# Patient Record
Sex: Female | Born: 1935 | Race: White | Hispanic: No | Smoking: Never smoker
Health system: Southern US, Community
[De-identification: ages and names within clinical notes are randomized; demographics above are authoritative.]

## PROBLEM LIST (undated history)

## (undated) DIAGNOSIS — Z86010 Personal history of colonic polyps: Secondary | ICD-10-CM

## (undated) DIAGNOSIS — N959 Unspecified menopausal and perimenopausal disorder: Secondary | ICD-10-CM

## (undated) DIAGNOSIS — R609 Edema, unspecified: Secondary | ICD-10-CM

## (undated) DIAGNOSIS — F341 Dysthymic disorder: Secondary | ICD-10-CM

## (undated) DIAGNOSIS — I10 Essential (primary) hypertension: Secondary | ICD-10-CM

## (undated) DIAGNOSIS — I4891 Unspecified atrial fibrillation: Principal | ICD-10-CM

## (undated) DIAGNOSIS — M1990 Unspecified osteoarthritis, unspecified site: Secondary | ICD-10-CM

## (undated) DIAGNOSIS — R0602 Shortness of breath: Secondary | ICD-10-CM

## (undated) DIAGNOSIS — I872 Venous insufficiency (chronic) (peripheral): Secondary | ICD-10-CM

## (undated) HISTORY — DX: Personal history of colonic polyps: Z86.010

## (undated) HISTORY — DX: Unspecified menopausal and perimenopausal disorder: N95.9

## (undated) HISTORY — DX: Unspecified osteoarthritis, unspecified site: M19.90

## (undated) HISTORY — PX: RECTOCELE REPAIR: SHX761

## (undated) HISTORY — DX: Venous insufficiency (chronic) (peripheral): I87.2

## (undated) HISTORY — DX: Shortness of breath: R06.02

## (undated) HISTORY — PX: CARDIAC CATHETERIZATION: SHX172

## (undated) HISTORY — DX: Essential (primary) hypertension: I10

## (undated) HISTORY — PX: HERNIA REPAIR: SHX51

## (undated) HISTORY — DX: Unspecified atrial fibrillation: I48.91

## (undated) HISTORY — DX: Dysthymic disorder: F34.1

## (undated) HISTORY — PX: CARPAL TUNNEL RELEASE: SHX101

## (undated) HISTORY — DX: Edema, unspecified: R60.9

## (undated) HISTORY — PX: BLADDER SURGERY: SHX569

---

## 1957-10-11 HISTORY — PX: ABDOMINAL HYSTERECTOMY: SHX81

## 1963-10-12 HISTORY — PX: CHOLECYSTECTOMY: SHX55

## 1991-10-12 HISTORY — PX: TOTAL KNEE ARTHROPLASTY: SHX125

## 1997-12-09 ENCOUNTER — Other Ambulatory Visit: Admission: RE | Admit: 1997-12-09 | Discharge: 1997-12-09 | Payer: Self-pay | Admitting: Obstetrics and Gynecology

## 1999-12-22 ENCOUNTER — Encounter: Admission: RE | Admit: 1999-12-22 | Discharge: 1999-12-22 | Payer: Self-pay | Admitting: Orthopedic Surgery

## 1999-12-22 ENCOUNTER — Encounter: Payer: Self-pay | Admitting: Orthopedic Surgery

## 2001-02-06 ENCOUNTER — Encounter: Payer: Self-pay | Admitting: Gastroenterology

## 2001-02-06 ENCOUNTER — Encounter: Admission: RE | Admit: 2001-02-06 | Discharge: 2001-02-06 | Payer: Self-pay | Admitting: Gastroenterology

## 2001-02-21 ENCOUNTER — Encounter (INDEPENDENT_AMBULATORY_CARE_PROVIDER_SITE_OTHER): Payer: Self-pay | Admitting: Specialist

## 2001-02-21 ENCOUNTER — Other Ambulatory Visit: Admission: RE | Admit: 2001-02-21 | Discharge: 2001-02-21 | Payer: Self-pay | Admitting: Gastroenterology

## 2001-03-28 ENCOUNTER — Encounter: Payer: Self-pay | Admitting: Gastroenterology

## 2001-03-28 ENCOUNTER — Encounter: Admission: RE | Admit: 2001-03-28 | Discharge: 2001-03-28 | Payer: Self-pay | Admitting: Gastroenterology

## 2001-10-20 ENCOUNTER — Encounter: Payer: Self-pay | Admitting: Ophthalmology

## 2001-10-24 ENCOUNTER — Ambulatory Visit (HOSPITAL_COMMUNITY): Admission: RE | Admit: 2001-10-24 | Discharge: 2001-10-24 | Payer: Self-pay | Admitting: Ophthalmology

## 2002-02-12 ENCOUNTER — Inpatient Hospital Stay (HOSPITAL_COMMUNITY): Admission: RE | Admit: 2002-02-12 | Discharge: 2002-02-15 | Payer: Self-pay | Admitting: Orthopedic Surgery

## 2002-02-15 ENCOUNTER — Inpatient Hospital Stay (HOSPITAL_COMMUNITY)
Admission: AD | Admit: 2002-02-15 | Discharge: 2002-02-22 | Payer: Self-pay | Admitting: Physical Medicine & Rehabilitation

## 2002-03-02 ENCOUNTER — Ambulatory Visit (HOSPITAL_COMMUNITY): Admission: RE | Admit: 2002-03-02 | Discharge: 2002-03-02 | Payer: Self-pay | Admitting: Orthopedic Surgery

## 2002-03-27 ENCOUNTER — Encounter: Admission: RE | Admit: 2002-03-27 | Discharge: 2002-04-20 | Payer: Self-pay | Admitting: Orthopedic Surgery

## 2004-06-04 ENCOUNTER — Ambulatory Visit (HOSPITAL_COMMUNITY): Admission: RE | Admit: 2004-06-04 | Discharge: 2004-06-04 | Payer: Self-pay | Admitting: Gastroenterology

## 2004-06-14 ENCOUNTER — Emergency Department (HOSPITAL_COMMUNITY): Admission: EM | Admit: 2004-06-14 | Discharge: 2004-06-14 | Payer: Self-pay | Admitting: Emergency Medicine

## 2004-08-21 ENCOUNTER — Ambulatory Visit: Payer: Self-pay | Admitting: Internal Medicine

## 2004-09-17 ENCOUNTER — Ambulatory Visit: Payer: Self-pay | Admitting: Internal Medicine

## 2004-10-15 ENCOUNTER — Ambulatory Visit: Payer: Self-pay | Admitting: Internal Medicine

## 2004-11-05 ENCOUNTER — Ambulatory Visit: Payer: Self-pay | Admitting: Internal Medicine

## 2004-11-12 ENCOUNTER — Ambulatory Visit: Payer: Self-pay | Admitting: Internal Medicine

## 2004-12-10 ENCOUNTER — Ambulatory Visit: Payer: Self-pay | Admitting: Internal Medicine

## 2005-01-07 ENCOUNTER — Ambulatory Visit: Payer: Self-pay | Admitting: Internal Medicine

## 2005-02-03 ENCOUNTER — Ambulatory Visit: Payer: Self-pay | Admitting: Internal Medicine

## 2005-02-10 ENCOUNTER — Ambulatory Visit: Payer: Self-pay | Admitting: *Deleted

## 2005-02-18 ENCOUNTER — Ambulatory Visit: Payer: Self-pay

## 2005-02-19 ENCOUNTER — Encounter: Payer: Self-pay | Admitting: Cardiology

## 2005-02-22 ENCOUNTER — Ambulatory Visit: Payer: Self-pay

## 2005-02-26 ENCOUNTER — Ambulatory Visit: Payer: Self-pay | Admitting: Cardiology

## 2005-02-26 ENCOUNTER — Ambulatory Visit (HOSPITAL_COMMUNITY): Admission: RE | Admit: 2005-02-26 | Discharge: 2005-02-26 | Payer: Self-pay | Admitting: Cardiology

## 2005-03-02 ENCOUNTER — Ambulatory Visit: Payer: Self-pay | Admitting: Internal Medicine

## 2005-03-04 ENCOUNTER — Ambulatory Visit: Payer: Self-pay | Admitting: Cardiology

## 2005-03-15 ENCOUNTER — Ambulatory Visit: Payer: Self-pay | Admitting: Cardiology

## 2005-03-29 ENCOUNTER — Ambulatory Visit: Payer: Self-pay | Admitting: Cardiology

## 2005-04-26 ENCOUNTER — Ambulatory Visit: Payer: Self-pay | Admitting: Internal Medicine

## 2005-05-17 ENCOUNTER — Ambulatory Visit: Payer: Self-pay | Admitting: Cardiology

## 2005-05-24 ENCOUNTER — Ambulatory Visit: Payer: Self-pay | Admitting: Internal Medicine

## 2005-06-15 ENCOUNTER — Ambulatory Visit: Payer: Self-pay | Admitting: Cardiology

## 2005-07-12 ENCOUNTER — Ambulatory Visit: Payer: Self-pay | Admitting: Cardiology

## 2005-08-09 ENCOUNTER — Ambulatory Visit: Payer: Self-pay | Admitting: Internal Medicine

## 2005-08-24 ENCOUNTER — Ambulatory Visit: Payer: Self-pay | Admitting: Internal Medicine

## 2005-09-06 ENCOUNTER — Ambulatory Visit: Payer: Self-pay | Admitting: Cardiology

## 2005-10-06 ENCOUNTER — Ambulatory Visit: Payer: Self-pay | Admitting: *Deleted

## 2005-11-01 ENCOUNTER — Ambulatory Visit: Payer: Self-pay | Admitting: Internal Medicine

## 2005-11-18 ENCOUNTER — Ambulatory Visit: Payer: Self-pay | Admitting: Internal Medicine

## 2005-11-29 ENCOUNTER — Ambulatory Visit: Payer: Self-pay | Admitting: Cardiology

## 2005-12-27 ENCOUNTER — Ambulatory Visit: Payer: Self-pay | Admitting: Internal Medicine

## 2006-01-24 ENCOUNTER — Ambulatory Visit: Payer: Self-pay | Admitting: Internal Medicine

## 2006-01-31 ENCOUNTER — Ambulatory Visit: Payer: Self-pay | Admitting: Internal Medicine

## 2006-02-21 ENCOUNTER — Ambulatory Visit: Payer: Self-pay | Admitting: Cardiology

## 2006-03-21 ENCOUNTER — Ambulatory Visit: Payer: Self-pay | Admitting: Cardiology

## 2006-04-04 ENCOUNTER — Ambulatory Visit: Payer: Self-pay | Admitting: Internal Medicine

## 2006-05-02 ENCOUNTER — Ambulatory Visit: Payer: Self-pay | Admitting: Cardiology

## 2006-05-06 ENCOUNTER — Ambulatory Visit: Payer: Self-pay | Admitting: Internal Medicine

## 2006-05-16 ENCOUNTER — Ambulatory Visit: Payer: Self-pay | Admitting: Internal Medicine

## 2006-05-30 ENCOUNTER — Ambulatory Visit: Payer: Self-pay | Admitting: Internal Medicine

## 2006-06-14 ENCOUNTER — Ambulatory Visit: Payer: Self-pay | Admitting: Cardiovascular Disease

## 2006-07-04 ENCOUNTER — Ambulatory Visit: Payer: Self-pay | Admitting: Cardiology

## 2006-08-02 ENCOUNTER — Ambulatory Visit: Payer: Self-pay | Admitting: Internal Medicine

## 2006-08-24 ENCOUNTER — Ambulatory Visit: Payer: Self-pay | Admitting: Internal Medicine

## 2006-08-30 ENCOUNTER — Ambulatory Visit: Payer: Self-pay | Admitting: Cardiovascular Disease

## 2006-09-27 ENCOUNTER — Ambulatory Visit: Payer: Self-pay | Admitting: Cardiology

## 2006-10-25 ENCOUNTER — Ambulatory Visit: Payer: Self-pay | Admitting: Cardiology

## 2006-11-01 ENCOUNTER — Ambulatory Visit: Payer: Self-pay | Admitting: Internal Medicine

## 2006-11-22 ENCOUNTER — Ambulatory Visit: Payer: Self-pay | Admitting: Cardiology

## 2006-12-01 ENCOUNTER — Ambulatory Visit: Payer: Self-pay | Admitting: Internal Medicine

## 2006-12-29 ENCOUNTER — Ambulatory Visit: Payer: Self-pay | Admitting: Cardiology

## 2007-01-26 ENCOUNTER — Ambulatory Visit: Payer: Self-pay | Admitting: Internal Medicine

## 2007-01-31 ENCOUNTER — Ambulatory Visit: Payer: Self-pay | Admitting: Internal Medicine

## 2007-02-07 ENCOUNTER — Encounter: Payer: Self-pay | Admitting: Internal Medicine

## 2007-02-16 ENCOUNTER — Ambulatory Visit: Payer: Self-pay | Admitting: Internal Medicine

## 2007-02-23 ENCOUNTER — Ambulatory Visit: Payer: Self-pay | Admitting: Internal Medicine

## 2007-03-16 ENCOUNTER — Ambulatory Visit: Payer: Self-pay | Admitting: Cardiovascular Disease

## 2007-03-31 ENCOUNTER — Ambulatory Visit: Payer: Self-pay | Admitting: Cardiovascular Disease

## 2007-04-10 DIAGNOSIS — M1990 Unspecified osteoarthritis, unspecified site: Secondary | ICD-10-CM

## 2007-04-10 DIAGNOSIS — Z860100 Personal history of colon polyps, unspecified: Secondary | ICD-10-CM | POA: Insufficient documentation

## 2007-04-10 DIAGNOSIS — I4891 Unspecified atrial fibrillation: Secondary | ICD-10-CM

## 2007-04-10 DIAGNOSIS — I10 Essential (primary) hypertension: Secondary | ICD-10-CM

## 2007-04-10 HISTORY — DX: Personal history of colon polyps, unspecified: Z86.0100

## 2007-04-10 HISTORY — DX: Unspecified atrial fibrillation: I48.91

## 2007-04-10 HISTORY — DX: Personal history of colonic polyps: Z86.010

## 2007-04-10 HISTORY — DX: Essential (primary) hypertension: I10

## 2007-04-10 HISTORY — DX: Unspecified osteoarthritis, unspecified site: M19.90

## 2007-04-25 ENCOUNTER — Ambulatory Visit: Payer: Self-pay | Admitting: Internal Medicine

## 2007-04-25 ENCOUNTER — Encounter: Payer: Self-pay | Admitting: Internal Medicine

## 2007-04-28 ENCOUNTER — Ambulatory Visit: Payer: Self-pay | Admitting: Internal Medicine

## 2007-05-15 ENCOUNTER — Ambulatory Visit: Payer: Self-pay | Admitting: Internal Medicine

## 2007-05-15 DIAGNOSIS — N959 Unspecified menopausal and perimenopausal disorder: Secondary | ICD-10-CM | POA: Insufficient documentation

## 2007-05-15 HISTORY — DX: Unspecified menopausal and perimenopausal disorder: N95.9

## 2007-05-26 ENCOUNTER — Ambulatory Visit: Payer: Self-pay | Admitting: Cardiology

## 2007-06-07 ENCOUNTER — Ambulatory Visit: Payer: Self-pay | Admitting: Cardiology

## 2007-06-28 ENCOUNTER — Ambulatory Visit: Payer: Self-pay | Admitting: Internal Medicine

## 2007-07-26 ENCOUNTER — Ambulatory Visit: Payer: Self-pay | Admitting: Cardiovascular Disease

## 2007-08-11 ENCOUNTER — Encounter: Payer: Self-pay | Admitting: Internal Medicine

## 2007-08-16 ENCOUNTER — Ambulatory Visit: Payer: Self-pay | Admitting: Internal Medicine

## 2007-08-23 ENCOUNTER — Ambulatory Visit: Payer: Self-pay | Admitting: Cardiology

## 2007-09-18 ENCOUNTER — Ambulatory Visit: Payer: Self-pay | Admitting: Cardiovascular Disease

## 2007-09-18 ENCOUNTER — Ambulatory Visit: Payer: Self-pay | Admitting: Internal Medicine

## 2007-10-16 ENCOUNTER — Ambulatory Visit: Payer: Self-pay | Admitting: Cardiology

## 2007-11-16 ENCOUNTER — Ambulatory Visit: Payer: Self-pay | Admitting: Cardiology

## 2007-12-14 ENCOUNTER — Ambulatory Visit: Payer: Self-pay | Admitting: Cardiovascular Disease

## 2007-12-18 ENCOUNTER — Ambulatory Visit: Payer: Self-pay | Admitting: Internal Medicine

## 2007-12-29 ENCOUNTER — Ambulatory Visit: Payer: Self-pay | Admitting: Cardiovascular Disease

## 2008-01-26 ENCOUNTER — Ambulatory Visit: Payer: Self-pay | Admitting: Internal Medicine

## 2008-02-16 ENCOUNTER — Ambulatory Visit: Payer: Self-pay | Admitting: Cardiology

## 2008-03-08 ENCOUNTER — Ambulatory Visit: Payer: Self-pay | Admitting: Cardiology

## 2008-03-18 ENCOUNTER — Ambulatory Visit: Payer: Self-pay | Admitting: Cardiovascular Disease

## 2008-03-20 ENCOUNTER — Ambulatory Visit: Payer: Self-pay | Admitting: Internal Medicine

## 2008-04-05 ENCOUNTER — Ambulatory Visit: Payer: Self-pay | Admitting: Cardiology

## 2008-04-16 ENCOUNTER — Ambulatory Visit: Payer: Self-pay | Admitting: Cardiovascular Disease

## 2008-05-07 ENCOUNTER — Ambulatory Visit: Payer: Self-pay | Admitting: Internal Medicine

## 2008-06-04 ENCOUNTER — Ambulatory Visit: Payer: Self-pay | Admitting: Cardiology

## 2008-07-02 ENCOUNTER — Ambulatory Visit: Payer: Self-pay | Admitting: Cardiology

## 2008-07-08 ENCOUNTER — Ambulatory Visit: Payer: Self-pay | Admitting: Internal Medicine

## 2008-07-08 LAB — CONVERTED CEMR LAB
AST: 31 units/L (ref 0–37)
Alkaline Phosphatase: 84 units/L (ref 39–117)
Bilirubin, Direct: 0.1 mg/dL (ref 0.0–0.3)
Chloride: 108 meq/L (ref 96–112)
Eosinophils Relative: 3.8 % (ref 0.0–5.0)
GFR calc Af Amer: 68 mL/min
Glucose, Bld: 98 mg/dL (ref 70–99)
HCT: 42.4 % (ref 36.0–46.0)
Lymphocytes Relative: 32.4 % (ref 12.0–46.0)
Monocytes Absolute: 0.7 10*3/uL (ref 0.1–1.0)
Monocytes Relative: 9.6 % (ref 3.0–12.0)
Neutrophils Relative %: 53.4 % (ref 43.0–77.0)
Platelets: 253 10*3/uL (ref 150–400)
Potassium: 4.9 meq/L (ref 3.5–5.1)
RDW: 18.2 % — ABNORMAL HIGH (ref 11.5–14.6)
Sodium: 146 meq/L — ABNORMAL HIGH (ref 135–145)
Total Protein: 7.5 g/dL (ref 6.0–8.3)
WBC: 7.3 10*3/uL (ref 4.5–10.5)

## 2008-07-30 ENCOUNTER — Ambulatory Visit: Payer: Self-pay | Admitting: Cardiology

## 2008-08-27 ENCOUNTER — Ambulatory Visit: Payer: Self-pay | Admitting: Cardiovascular Disease

## 2008-09-16 ENCOUNTER — Ambulatory Visit: Payer: Self-pay | Admitting: Cardiovascular Disease

## 2008-09-24 ENCOUNTER — Ambulatory Visit: Payer: Self-pay | Admitting: Internal Medicine

## 2008-10-15 ENCOUNTER — Ambulatory Visit: Payer: Self-pay | Admitting: Internal Medicine

## 2008-10-22 ENCOUNTER — Ambulatory Visit: Payer: Self-pay | Admitting: Cardiovascular Disease

## 2008-11-18 ENCOUNTER — Ambulatory Visit: Payer: Self-pay | Admitting: Cardiology

## 2008-11-29 ENCOUNTER — Ambulatory Visit: Payer: Self-pay | Admitting: Internal Medicine

## 2008-11-29 DIAGNOSIS — K645 Perianal venous thrombosis: Secondary | ICD-10-CM

## 2008-12-05 ENCOUNTER — Encounter: Payer: Self-pay | Admitting: Internal Medicine

## 2008-12-17 ENCOUNTER — Ambulatory Visit: Payer: Self-pay | Admitting: Cardiology

## 2008-12-19 ENCOUNTER — Encounter: Payer: Self-pay | Admitting: Internal Medicine

## 2009-01-14 ENCOUNTER — Ambulatory Visit: Payer: Self-pay | Admitting: Cardiovascular Disease

## 2009-02-11 ENCOUNTER — Ambulatory Visit: Payer: Self-pay | Admitting: Internal Medicine

## 2009-02-11 DIAGNOSIS — R0602 Shortness of breath: Secondary | ICD-10-CM

## 2009-02-11 HISTORY — DX: Shortness of breath: R06.02

## 2009-02-12 ENCOUNTER — Ambulatory Visit: Payer: Self-pay | Admitting: Cardiovascular Disease

## 2009-03-11 ENCOUNTER — Encounter: Payer: Self-pay | Admitting: *Deleted

## 2009-03-12 ENCOUNTER — Ambulatory Visit: Payer: Self-pay | Admitting: Cardiology

## 2009-03-13 DIAGNOSIS — R609 Edema, unspecified: Secondary | ICD-10-CM

## 2009-03-13 HISTORY — DX: Edema, unspecified: R60.9

## 2009-03-14 ENCOUNTER — Ambulatory Visit: Payer: Self-pay | Admitting: Cardiovascular Disease

## 2009-04-04 ENCOUNTER — Ambulatory Visit: Payer: Self-pay | Admitting: Cardiology

## 2009-04-04 LAB — CONVERTED CEMR LAB
POC INR: 1.9
Prothrombin Time: 17 s

## 2009-04-16 ENCOUNTER — Encounter: Payer: Self-pay | Admitting: *Deleted

## 2009-05-02 ENCOUNTER — Ambulatory Visit: Payer: Self-pay | Admitting: Internal Medicine

## 2009-05-02 LAB — CONVERTED CEMR LAB: Prothrombin Time: 15.8 s

## 2009-05-14 ENCOUNTER — Ambulatory Visit: Payer: Self-pay | Admitting: Internal Medicine

## 2009-05-16 ENCOUNTER — Ambulatory Visit: Payer: Self-pay | Admitting: Cardiology

## 2009-05-16 LAB — CONVERTED CEMR LAB
POC INR: 2.3
Prothrombin Time: 18.7 s

## 2009-06-06 ENCOUNTER — Ambulatory Visit: Payer: Self-pay | Admitting: Cardiovascular Disease

## 2009-07-04 ENCOUNTER — Ambulatory Visit: Payer: Self-pay | Admitting: Internal Medicine

## 2009-07-04 LAB — CONVERTED CEMR LAB: POC INR: 2.8

## 2009-08-01 ENCOUNTER — Ambulatory Visit: Payer: Self-pay | Admitting: Cardiology

## 2009-08-13 ENCOUNTER — Ambulatory Visit: Payer: Self-pay | Admitting: Internal Medicine

## 2009-08-29 ENCOUNTER — Ambulatory Visit: Payer: Self-pay | Admitting: Cardiology

## 2009-09-08 ENCOUNTER — Encounter (INDEPENDENT_AMBULATORY_CARE_PROVIDER_SITE_OTHER): Payer: Self-pay | Admitting: *Deleted

## 2009-09-10 ENCOUNTER — Ambulatory Visit: Payer: Self-pay | Admitting: Cardiovascular Disease

## 2009-09-24 ENCOUNTER — Ambulatory Visit: Payer: Self-pay | Admitting: Cardiology

## 2009-10-23 ENCOUNTER — Ambulatory Visit: Payer: Self-pay | Admitting: Internal Medicine

## 2009-11-06 ENCOUNTER — Telehealth: Payer: Self-pay | Admitting: Internal Medicine

## 2009-11-24 ENCOUNTER — Ambulatory Visit: Payer: Self-pay | Admitting: Cardiology

## 2009-11-24 ENCOUNTER — Encounter (INDEPENDENT_AMBULATORY_CARE_PROVIDER_SITE_OTHER): Payer: Self-pay | Admitting: Cardiology

## 2009-11-24 LAB — CONVERTED CEMR LAB: POC INR: 3.3

## 2009-12-11 ENCOUNTER — Ambulatory Visit: Payer: Self-pay | Admitting: Internal Medicine

## 2009-12-11 DIAGNOSIS — F341 Dysthymic disorder: Secondary | ICD-10-CM

## 2009-12-11 HISTORY — DX: Dysthymic disorder: F34.1

## 2009-12-15 ENCOUNTER — Ambulatory Visit: Payer: Self-pay | Admitting: Internal Medicine

## 2009-12-15 LAB — CONVERTED CEMR LAB: POC INR: 2.2

## 2010-01-05 ENCOUNTER — Telehealth: Payer: Self-pay | Admitting: Internal Medicine

## 2010-01-13 ENCOUNTER — Ambulatory Visit: Payer: Self-pay | Admitting: Internal Medicine

## 2010-01-13 LAB — CONVERTED CEMR LAB: POC INR: 2.4

## 2010-01-22 ENCOUNTER — Ambulatory Visit: Payer: Self-pay | Admitting: Internal Medicine

## 2010-01-22 DIAGNOSIS — I872 Venous insufficiency (chronic) (peripheral): Secondary | ICD-10-CM

## 2010-01-22 HISTORY — DX: Venous insufficiency (chronic) (peripheral): I87.2

## 2010-02-10 ENCOUNTER — Ambulatory Visit: Payer: Self-pay | Admitting: Cardiology

## 2010-03-11 ENCOUNTER — Ambulatory Visit: Payer: Self-pay | Admitting: Cardiology

## 2010-03-11 ENCOUNTER — Ambulatory Visit: Payer: Self-pay | Admitting: Cardiovascular Disease

## 2010-04-08 ENCOUNTER — Ambulatory Visit: Payer: Self-pay | Admitting: Cardiology

## 2010-04-08 LAB — CONVERTED CEMR LAB: POC INR: 2.6

## 2010-05-06 ENCOUNTER — Ambulatory Visit: Payer: Self-pay | Admitting: Cardiology

## 2010-05-27 ENCOUNTER — Ambulatory Visit: Payer: Self-pay | Admitting: Internal Medicine

## 2010-06-03 ENCOUNTER — Ambulatory Visit: Payer: Self-pay | Admitting: Cardiology

## 2010-07-01 ENCOUNTER — Ambulatory Visit: Payer: Self-pay | Admitting: Cardiology

## 2010-07-01 LAB — CONVERTED CEMR LAB: POC INR: 3.3

## 2010-07-13 ENCOUNTER — Encounter: Payer: Self-pay | Admitting: Cardiology

## 2010-07-13 ENCOUNTER — Ambulatory Visit: Payer: Self-pay | Admitting: Cardiology

## 2010-07-13 ENCOUNTER — Observation Stay (HOSPITAL_COMMUNITY)
Admission: EM | Admit: 2010-07-13 | Discharge: 2010-07-14 | Payer: Self-pay | Source: Home / Self Care | Admitting: Emergency Medicine

## 2010-07-13 ENCOUNTER — Telehealth: Payer: Self-pay | Admitting: Cardiovascular Disease

## 2010-07-13 IMAGING — CR DG CHEST 2V
2 series · 2 of 2 positions shown · non-contrast
Comparison: [DATE]

CLINICAL DATA: Chest pain

CHEST - 2 VIEW

[w chest pa]
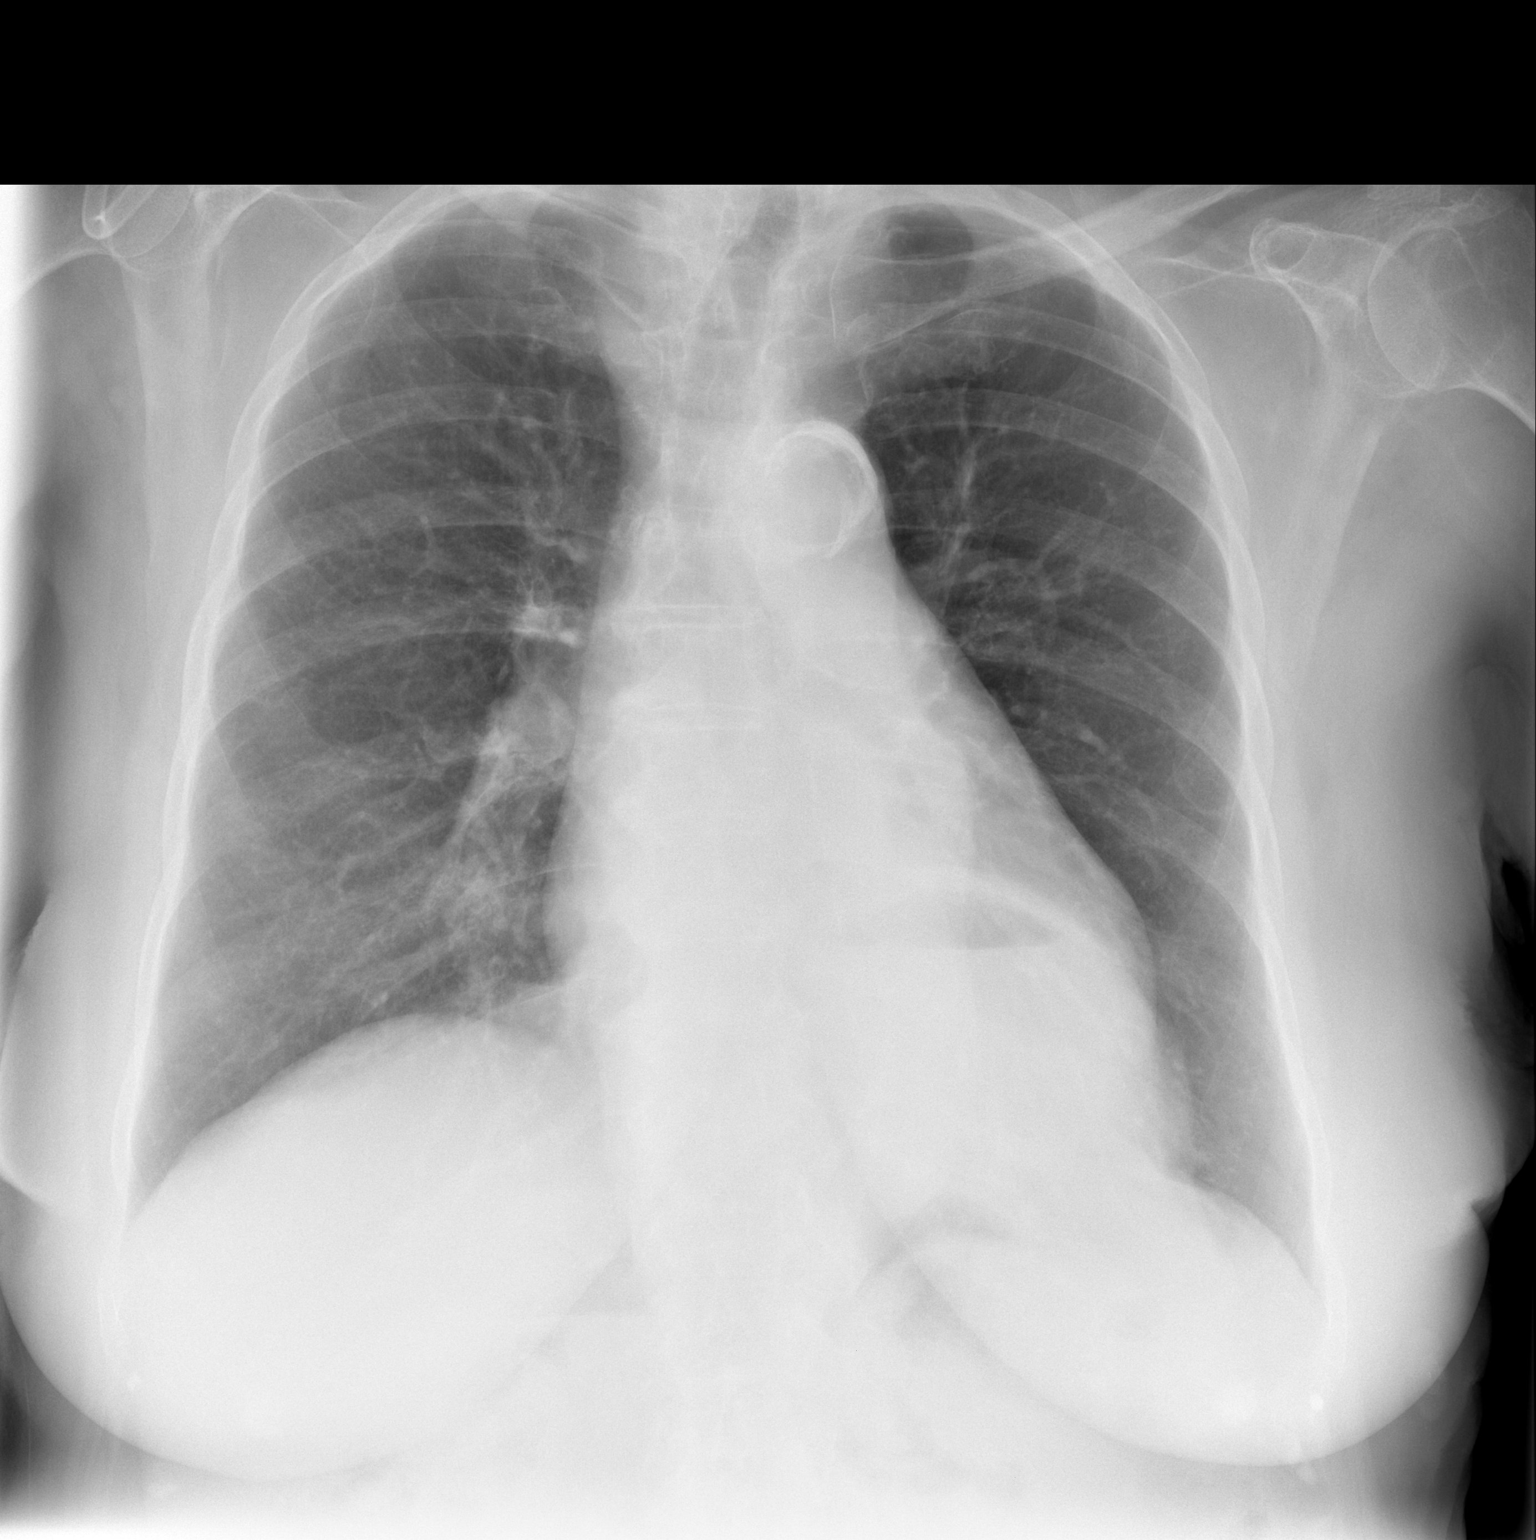

[w chest lat]
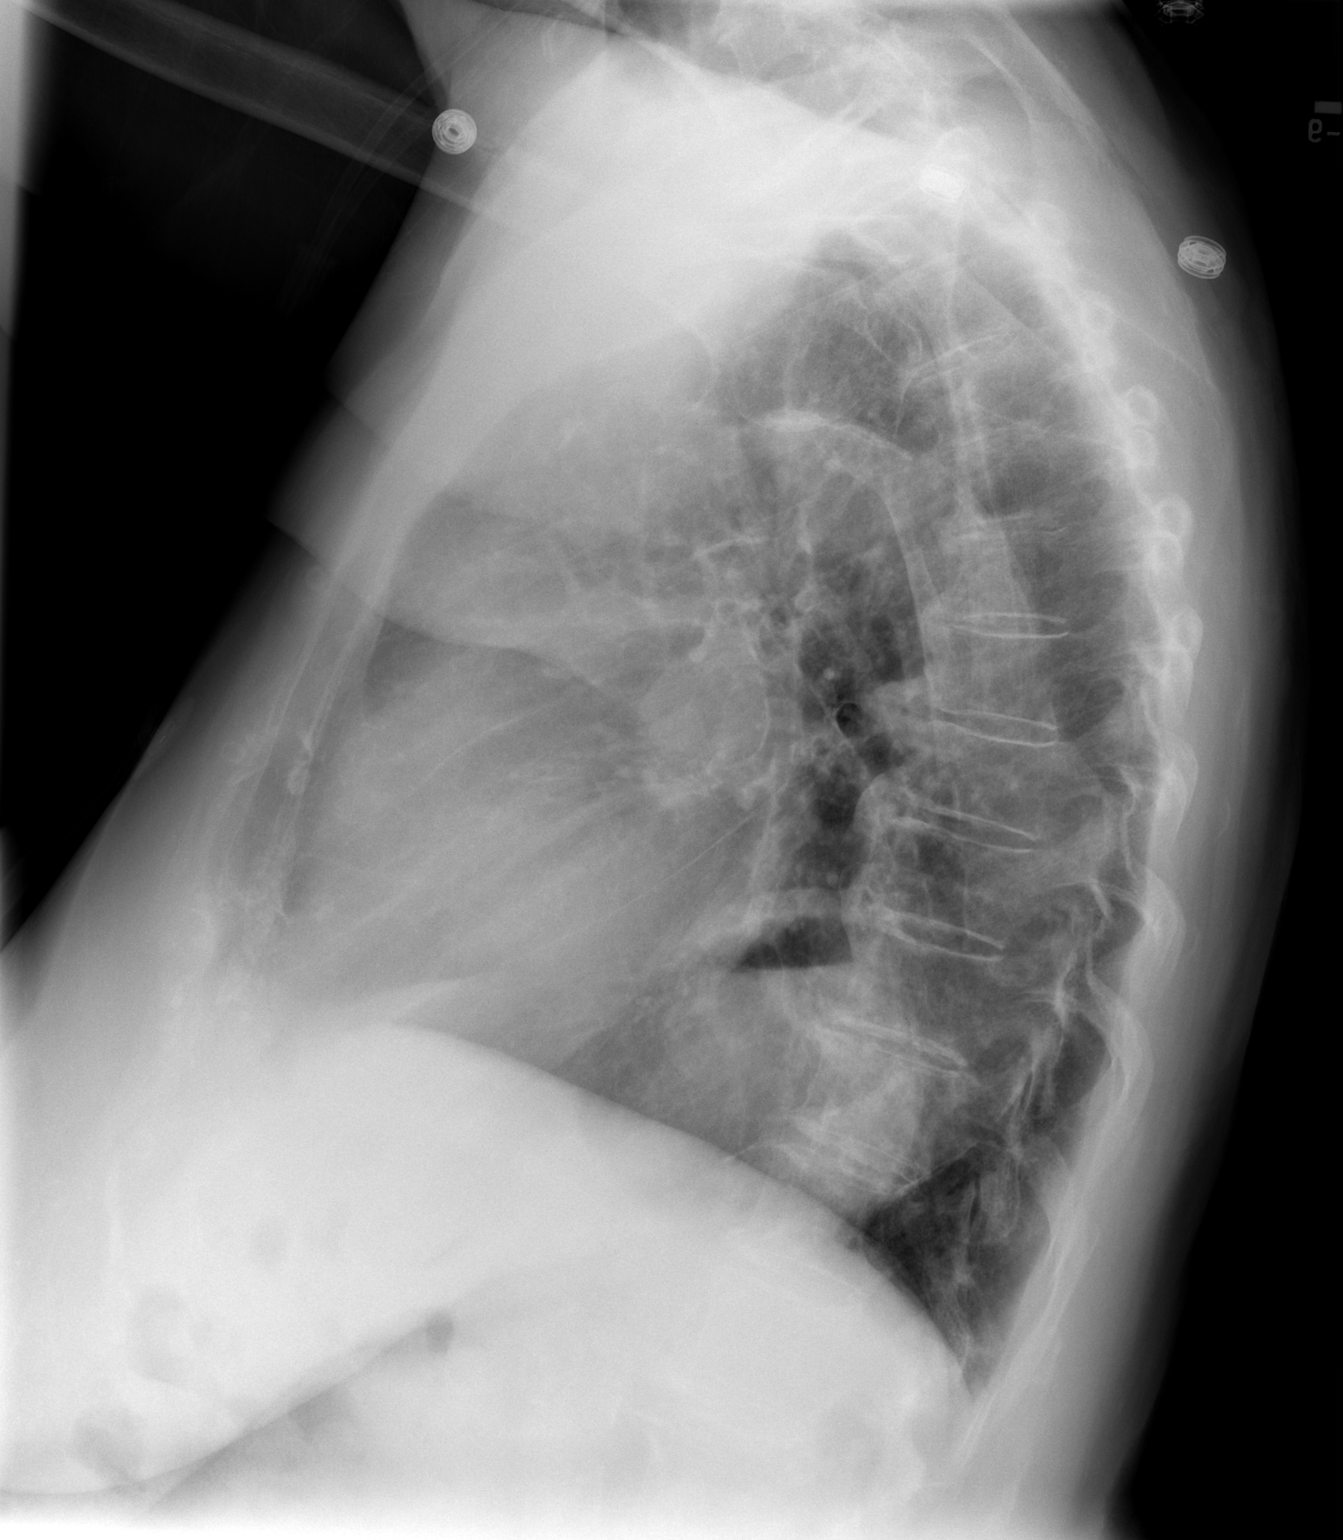

[2 of 2 positions shown; findings below may reference images not displayed]

FINDINGS: Heart size upper normal.  Aorta tortuous and heavily
calcified.  No congestive heart failure or active disease.
Moderate-to-large hiatal hernia noted.  Osseous structures intact.
IMPRESSION: 1.  Tortuous and heavily calcified aorta.
2.  Hiatal hernia.
3.  No active disease.

## 2010-07-22 ENCOUNTER — Ambulatory Visit: Payer: Self-pay | Admitting: Cardiology

## 2010-07-22 LAB — CONVERTED CEMR LAB: POC INR: 2.6

## 2010-08-10 ENCOUNTER — Telehealth: Payer: Self-pay | Admitting: Internal Medicine

## 2010-08-13 ENCOUNTER — Ambulatory Visit: Payer: Self-pay | Admitting: Cardiovascular Disease

## 2010-08-13 ENCOUNTER — Encounter: Payer: Self-pay | Admitting: Cardiovascular Disease

## 2010-08-13 ENCOUNTER — Ambulatory Visit (HOSPITAL_COMMUNITY): Admission: RE | Admit: 2010-08-13 | Discharge: 2010-08-13 | Payer: Self-pay | Admitting: Cardiovascular Disease

## 2010-08-13 ENCOUNTER — Ambulatory Visit: Payer: Self-pay

## 2010-08-19 ENCOUNTER — Encounter: Payer: Self-pay | Admitting: Cardiology

## 2010-08-19 ENCOUNTER — Encounter: Payer: Self-pay | Admitting: Cardiovascular Disease

## 2010-08-19 ENCOUNTER — Ambulatory Visit: Payer: Self-pay

## 2010-08-19 ENCOUNTER — Ambulatory Visit: Payer: Self-pay | Admitting: Cardiovascular Disease

## 2010-08-19 LAB — CONVERTED CEMR LAB: POC INR: 2.3

## 2010-09-09 ENCOUNTER — Encounter: Payer: Self-pay | Admitting: Internal Medicine

## 2010-09-16 ENCOUNTER — Ambulatory Visit: Payer: Self-pay | Admitting: Internal Medicine

## 2010-09-16 LAB — CONVERTED CEMR LAB: POC INR: 2

## 2010-09-23 ENCOUNTER — Ambulatory Visit: Payer: Self-pay | Admitting: Internal Medicine

## 2010-10-14 ENCOUNTER — Ambulatory Visit: Admission: RE | Admit: 2010-10-14 | Discharge: 2010-10-14 | Payer: Self-pay | Source: Home / Self Care

## 2010-10-14 LAB — CONVERTED CEMR LAB: POC INR: 2.3

## 2010-11-08 LAB — CONVERTED CEMR LAB
AST: 24 units/L (ref 0–37)
Basophils Absolute: 0 10*3/uL (ref 0.0–0.1)
Bilirubin, Direct: 0.2 mg/dL (ref 0.0–0.3)
CO2: 30 meq/L (ref 19–32)
Chloride: 107 meq/L (ref 96–112)
Creatinine, Ser: 0.9 mg/dL (ref 0.4–1.2)
Eosinophils Relative: 2.4 % (ref 0.0–5.0)
Glucose, Bld: 94 mg/dL (ref 70–99)
HCT: 38.2 % (ref 36.0–46.0)
Hemoglobin: 13 g/dL (ref 12.0–15.0)
MCHC: 34.1 g/dL (ref 30.0–36.0)
MCV: 89 fL (ref 78.0–100.0)
Monocytes Absolute: 0.6 10*3/uL (ref 0.2–0.7)
Neutrophils Relative %: 52.7 % (ref 43.0–77.0)
Potassium: 4 meq/L (ref 3.5–5.1)
RBC: 4.29 M/uL (ref 3.87–5.11)
RDW: 16.8 % — ABNORMAL HIGH (ref 11.5–14.6)
Sodium: 145 meq/L (ref 135–145)
TSH: 1.49 microintl units/mL (ref 0.35–5.50)
Total Bilirubin: 1.4 mg/dL — ABNORMAL HIGH (ref 0.3–1.2)
Total Protein: 6.6 g/dL (ref 6.0–8.3)
WBC: 5.7 10*3/uL (ref 4.5–10.5)

## 2010-11-11 ENCOUNTER — Encounter: Payer: Self-pay | Admitting: Internal Medicine

## 2010-11-11 ENCOUNTER — Ambulatory Visit: Admit: 2010-11-11 | Payer: Self-pay

## 2010-11-11 ENCOUNTER — Encounter (INDEPENDENT_AMBULATORY_CARE_PROVIDER_SITE_OTHER): Payer: MEDICARE

## 2010-11-11 DIAGNOSIS — I4891 Unspecified atrial fibrillation: Secondary | ICD-10-CM

## 2010-11-11 DIAGNOSIS — Z7901 Long term (current) use of anticoagulants: Secondary | ICD-10-CM

## 2010-11-11 LAB — CONVERTED CEMR LAB: POC INR: 2

## 2010-11-12 NOTE — Assessment & Plan Note (Signed)
Summary: 4 MONTH ROV/NJR   Vital Signs:  Patient profile:   77 year old female Weight:      173 pounds Temp:     97.4 degrees F oral BP sitting:   140 / 70  (left arm) Cuff size:   regular  Vitals Entered By: Duard Brady LPN (September 23, 2010 10:32 AM) CC: 4 month rov---doing ok  Is Patient Diabetic? No   Primary Care Provider:  Gordy Savers  MD  CC:  4 month rov---doing ok .  History of Present Illness: 77 year old patient who is seen today for follow-up.  She has a history of chronic atrial fibrillation on rate control and anticoagulation.  Coumadin therapy.  She has DJD and a history of well-controlled hypertension.  She does remarkably well at 77 years of age.She denies any cardiopulmonary complaints.  She has had a recent cardiology visit.  Her most recent INR was 2.2.  She has mild chronic venous insufficiency and edema  Allergies: 1)  ! Pcn 2)  ! Sulfa 3)  ! Nsaids 4)  ! Codeine  Past History:  Past Medical History: Reviewed history from 03/13/2009 and no changes required. ATRIAL FIBRILLATION (ICD-427.31) HYPERTENSION (ICD-401.9) EDEMA (ICD-782.3) DYSPNEA (ICD-786.05) HEMORRHOID, EXTERNAL, THROMBOSED (ICD-455.4) DISORDER, MENOPAUSAL NOS (ICD-627.9) OSTEOARTHRITIS (ICD-715.90) DEGENERATIVE JOINT DISEASE (ICD-715.90) COLONIC POLYPS, HX OF (ICD-V12.72)    Past Surgical History: Reviewed history from 04/10/2007 and no changes required. Carpal tunnel release-right Cholecystectomy-1967 Hysterectomy-1959 Total knee replacement-right-2003 Rectocele/bladder tacking-1996 hernia repair-left G 4 P 4 AB 0 Heart cath-2006 Colonoscopy-05/2004  Review of Systems       The patient complains of peripheral edema.  The patient denies anorexia, fever, weight loss, weight gain, vision loss, decreased hearing, hoarseness, chest pain, syncope, dyspnea on exertion, prolonged cough, headaches, hemoptysis, abdominal pain, melena, hematochezia, severe  indigestion/heartburn, hematuria, incontinence, genital sores, muscle weakness, suspicious skin lesions, transient blindness, difficulty walking, depression, unusual weight change, abnormal bleeding, enlarged lymph nodes, angioedema, and breast masses.    Physical Exam  General:  overweight-appearing.  110/74overweight-appearing.   Head:  Normocephalic and atraumatic without obvious abnormalities. No apparent alopecia or balding. Eyes:  No corneal or conjunctival inflammation noted. EOMI. Perrla. Funduscopic exam benign, without hemorrhages, exudates or papilledema. Vision grossly normal. Mouth:  Oral mucosa and oropharynx without lesions or exudates.  Teeth in good repair. Neck:  No deformities, masses, or tenderness noted. Lungs:  Normal respiratory effort, chest expands symmetrically. Lungs are clear to auscultation, no crackles or wheezes. Heart:  irregular rhythm with a controlled ventricular response Abdomen:  Bowel sounds positive,abdomen soft and non-tender without masses, organomegaly or hernias noted. Msk:  No deformity or scoliosis noted of thoracic or lumbar spine.   Pulses:  pedal pulses intact Extremities:  trace left pedal edema and trace right pedal edema.  trace left pedal edema and trace right pedal edema.     Impression & Recommendations:  Problem # 1:  VENOUS INSUFFICIENCY, CHRONIC (ICD-459.81)  Problem # 2:  ATRIAL FIBRILLATION (ICD-427.31)  The following medications were removed from the medication list:    Metoprolol Succinate 50 Mg Tb24 (Metoprolol succinate) .Marland Kitchen... 1/2 once daily Her updated medication list for this problem includes:    Coumadin 2 Mg Tabs (Warfarin sodium) .Marland Kitchen... Take as directed by coumadin clinic.    Cardizem Cd 120 Mg Xr24h-cap (Diltiazem hcl coated beads) ..... Once daily    Atenolol 25 Mg Tabs (Atenolol) ..... One daily  The following medications were removed from the medication list:    Metoprolol  Succinate 50 Mg Tb24 (Metoprolol succinate)  .Marland Kitchen... 1/2 once daily Her updated medication list for this problem includes:    Coumadin 2 Mg Tabs (Warfarin sodium) .Marland Kitchen... Take as directed by coumadin clinic.    Cardizem Cd 120 Mg Xr24h-cap (Diltiazem hcl coated beads) ..... Once daily    Atenolol 25 Mg Tabs (Atenolol) ..... One daily  Problem # 3:  COUMADIN THERAPY (ICD-V58.61)  Problem # 4:  HYPERTENSION (ICD-401.9)  The following medications were removed from the medication list:    Metoprolol Succinate 50 Mg Tb24 (Metoprolol succinate) .Marland Kitchen... 1/2 once daily Her updated medication list for this problem includes:    Furosemide 40 Mg Tabs (Furosemide) .Marland Kitchen... 1 tab po bid .    Cardizem Cd 120 Mg Xr24h-cap (Diltiazem hcl coated beads) ..... Once daily    Atenolol 25 Mg Tabs (Atenolol) ..... One daily  The following medications were removed from the medication list:    Metoprolol Succinate 50 Mg Tb24 (Metoprolol succinate) .Marland Kitchen... 1/2 once daily Her updated medication list for this problem includes:    Furosemide 40 Mg Tabs (Furosemide) .Marland Kitchen... 1 tab po bid .    Cardizem Cd 120 Mg Xr24h-cap (Diltiazem hcl coated beads) ..... Once daily    Atenolol 25 Mg Tabs (Atenolol) ..... One daily  Complete Medication List: 1)  Furosemide 40 Mg Tabs (Furosemide) .Marland Kitchen.. 1 tab po bid . 2)  Potassium Chloride Crys Cr 20 Meq Tbcr (Potassium chloride crys cr) .Marland Kitchen.. 1 tab by mouth two times a day 3)  Coumadin 2 Mg Tabs (Warfarin sodium) .... Take as directed by coumadin clinic. 4)  Stool Softner  .Marland Kitchen.. 1 tab two times a day 5)  Fish Oil 1000 Mg Caps (Omega-3 fatty acids) .Marland Kitchen.. 1 cap two times a day 6)  Flax Seed Oil 1000 Mg Caps (Flaxseed (linseed)) .Marland Kitchen.. 1 cap once daily 7)  Cardizem Cd 120 Mg Xr24h-cap (Diltiazem hcl coated beads) .... Once daily 8)  Centrum Silver Tabs (Multiple vitamins-minerals) .Marland Kitchen.. 1 tab by mouth once daily 9)  Atenolol 25 Mg Tabs (Atenolol) .... One daily  Patient Instructions: 1)  Please schedule a follow-up appointment in 4 months. 2)   Limit your Sodium (Salt). 3)  It is important that you exercise regularly at least 20 minutes 5 times a week. If you develop chest pain, have severe difficulty breathing, or feel very tired , stop exercising immediately and seek medical attention. 4)  You need to lose weight. Consider a lower calorie diet and regular exercise.  Prescriptions: ATENOLOL 25 MG TABS (ATENOLOL) one daily  #90 x 6   Entered and Authorized by:   Gordy Savers  MD   Signed by:   Gordy Savers  MD on 09/23/2010   Method used:   [facility] to        [facility]  [id number]       [address]       [place], [place]  [address]       Ph: [phone] or [phone]       Fax: [phone]   RxID:   [id number] CARDIZEM CD 120 MG XR24H-CAP (DILTIAZEM HCL COATED BEADS) once daily  #90 x 3   Entered and Authorized by:   Gordy Savers  MD   Signed by:   Gordy Savers  MD on 09/23/2010   Method used:   [facility]ically to        [facility] [id number] ([facility]       [address]       [place], [place]  [address]       Ph: [phone] or [phone]       Fax: [phone]   RxID:   [id number] COUMADIN 2 MG TABS (WARFARIN SODIUM) Take as directed by coumadin clinic.  #90 Tablet x 3   Entered and Authorized by:   Gordy Savers  MD   Signed by:   Gordy Savers  MD on 09/23/2010   Method used:   [facility]ically to        [id number]       [address]       [place], [place]  [address]       Ph: [phone] or [phone]       Fax: [phone]   RxID:   [id number] POTASSIUM CHLORIDE CRYS CR 20 MEQ TBCR (POTASSIUM CHLORIDE CRYS CR) 1 tab by mouth two times a day  #180 x 6   Entered and Authorized by:   Gordy Savers  MD   Signed by:   Gordy Savers  MD on 09/23/2010   Method used:   [facility]ically to        [id number]       [address]       [place], [place]  [address]       Ph: [phone] or [phone]       Fax: [phone]   RxID:   [id number] FUROSEMIDE 40 MG TABS (FUROSEMIDE) 1 tab po bid .  #180 x 6   Entered and Authorized by:   Gordy Savers  MD   Signed by:   Gordy Savers  MD on 09/23/2010   Method used:   [facility]ically to        [id number]       [address]       [place], [place]  [address]       Ph: [phone] or [phone]       Fax: [phone]   RxID:   [id number]    Orders Added: 1)  Est. Patient Level IV [19379]

## 2010-11-12 NOTE — Assessment & Plan Note (Signed)
Summary: 4 MONTH FUP//CCM/pt rescd from bump//ccm   Vital Signs:  Patient profile:   77 year old Roberts Weight:      178 pounds Temp:     98.3 degrees F oral BP sitting:   150 / 90  (left arm) Cuff size:   regular  Vitals Entered By: Kathrynn Speed CMA (May 27, 2010 11:32 AM) CC: 4 month fup,more problems with anxiety & crying, son has cancer,src   Primary Care Sarthak Rubenstein:  Gordy Savers  MD  CC:  4 month fup, more problems with anxiety & crying, son has cancer, and src.  History of Present Illness: 77 year old Roberts in today for follow-up.  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  She has had considerable anxiety and depression due to the poor health a son, who has metastatic lung cancer.  She has treated hypertension, osteoarthritis, and a history of chronic venous insufficiency.  Denies any cardiopulmonary complaints  Current Medications (verified): 1)  Furosemide 40 Mg Tabs (Furosemide) .Marland Kitchen.. 1 Q Am, 1/2 At Bedtime 2)  Metoprolol Succinate 50 Mg Tb24 (Metoprolol Succinate) .... 1/2 Once Daily 3)  Potassium Chloride Crys Cr 20 Meq Tbcr (Potassium Chloride Crys Cr) .Marland Kitchen.. 1 Q Am, 1/2 At Bedtime 4)  Coumadin 2 Mg Tabs (Warfarin Sodium) .... Take As Directed By Coumadin Clinic. 5)  Stool Softner .Marland Kitchen.. 1 Tab Two Times A Day 6)  Fish Oil 1000 Mg Caps (Omega-3 Fatty Acids) .Marland Kitchen.. 1 Cap Two Times A Day 7)  Flax Seed Oil 1000 Mg Caps (Flaxseed (Linseed)) .Marland Kitchen.. 1 Cap Once Daily 8)  Cardizem Cd 120 Mg Xr24h-Cap (Diltiazem Hcl Coated Beads) .... Once Daily 9)  Centrum Silver  Tabs (Multiple Vitamins-Minerals) .Marland Kitchen.. 1 Tab By Mouth Once Daily 10)  Lorazepam 0.5 Mg Tabs (Lorazepam) .Marland Kitchen.. 1 By Mouth For Anxiety.Tinisha Hakimian Will Call Back To Clarify Prescribed Dose and Instructions..mp 11/24/2009 1205 Pm  Allergies (verified): 1)  ! Pcn 2)  ! Sulfa 3)  ! Nsaids 4)  ! Codeine  Past History:  Past Medical History: Reviewed history from 03/13/2009 and no changes  required. ATRIAL FIBRILLATION (ICD-427.31) HYPERTENSION (ICD-401.9) EDEMA (ICD-782.3) DYSPNEA (ICD-786.05) HEMORRHOID, EXTERNAL, THROMBOSED (ICD-455.4) DISORDER, MENOPAUSAL NOS (ICD-627.9) OSTEOARTHRITIS (ICD-715.90) DEGENERATIVE JOINT DISEASE (ICD-715.90) COLONIC POLYPS, HX OF (ICD-V12.72)    Past Surgical History: Reviewed history from 04/10/2007 and no changes required. Carpal tunnel release-right Cholecystectomy-1967 Hysterectomy-1959 Total knee replacement-right-2003 Rectocele/bladder tacking-1996 hernia repair-left G 4 P 4 AB 0 Heart cath-2006 Colonoscopy-05/2004  Social History: Former Smoker Alcohol use-no Widowed x2 Handicapped daughter lives with her 2 other living sons- one with metastatic lung cancer  Review of Systems       The patient complains of depression.  The patient denies anorexia, fever, weight loss, weight gain, vision loss, decreased hearing, hoarseness, chest pain, syncope, dyspnea on exertion, peripheral edema, prolonged cough, headaches, hemoptysis, abdominal pain, melena, hematochezia, severe indigestion/heartburn, hematuria, incontinence, genital sores, muscle weakness, suspicious skin lesions, transient blindness, difficulty walking, unusual weight change, abnormal bleeding, enlarged lymph nodes, angioedema, and breast masses.    Physical Exam  General:  overweight-appearing.  140/80 Head:  Normocephalic and atraumatic without obvious abnormalities. No apparent alopecia or balding. Eyes:  No corneal or conjunctival inflammation noted. EOMI. Perrla. Funduscopic exam benign, without hemorrhages, exudates or papilledema. Vision grossly normal. Mouth:  Oral mucosa and oropharynx without lesions or exudates.  Teeth in good repair. Neck:  No deformities, masses, or tenderness noted. Lungs:  Normal respiratory effort, chest expands symmetrically. Lungs are  clear to auscultation, no crackles or wheezes. Heart:  Normal rate and regular rhythm. S1 and S2  normal without gallop, murmur, click, rub or other extra sounds. Abdomen:  Bowel sounds positive,abdomen soft and non-tender without masses, organomegaly or hernias noted. Msk:  No deformity or scoliosis noted of thoracic or lumbar spine.   Pulses:  R and L carotid,radial,femoral,dorsalis pedis and posterior tibial pulses are full and equal bilaterally Extremities:  No clubbing, cyanosis, edema, or deformity noted with normal full range of motion of all joints.     Impression & Recommendations:  Problem # 1:  VENOUS INSUFFICIENCY, CHRONIC (ICD-459.81) clinically stable  Problem # 2:  ATRIAL FIBRILLATION (ICD-427.31)  Her updated medication list for this problem includes:    Metoprolol Succinate 50 Mg Tb24 (Metoprolol succinate) .Marland Kitchen... 1/2 once daily    Coumadin 2 Mg Tabs (Warfarin sodium) .Marland Kitchen... Take as directed by coumadin clinic.    Cardizem Cd 120 Mg Xr24h-cap (Diltiazem hcl coated beads) ..... Once daily  Problem # 3:  HYPERTENSION (ICD-401.9)  Her updated medication list for this problem includes:    Furosemide 40 Mg Tabs (Furosemide) .Marland Kitchen... 1 q am, 1/2 at bedtime    Metoprolol Succinate 50 Mg Tb24 (Metoprolol succinate) .Marland Kitchen... 1/2 once daily    Cardizem Cd 120 Mg Xr24h-cap (Diltiazem hcl coated beads) ..... Once daily  Problem # 4:  OSTEOARTHRITIS (ICD-715.90)  Problem # 5:  ANXIETY DEPRESSION (ICD-300.4) lorazepam refill  Complete Medication List: 1)  Furosemide 40 Mg Tabs (Furosemide) .Marland Kitchen.. 1 q am, 1/2 at bedtime 2)  Metoprolol Succinate 50 Mg Tb24 (Metoprolol succinate) .... 1/2 once daily 3)  Potassium Chloride Crys Cr 20 Meq Tbcr (Potassium chloride crys cr) .Marland Kitchen.. 1 q am, 1/2 at bedtime 4)  Coumadin 2 Mg Tabs (Warfarin sodium) .... Take as directed by coumadin clinic. 5)  Stool Softner  .Marland Kitchen.. 1 tab two times a day 6)  Fish Oil 1000 Mg Caps (Omega-3 fatty acids) .Marland Kitchen.. 1 cap two times a day 7)  Flax Seed Oil 1000 Mg Caps (Flaxseed (linseed)) .Marland Kitchen.. 1 cap once daily 8)  Cardizem  Cd 120 Mg Xr24h-cap (Diltiazem hcl coated beads) .... Once daily 9)  Centrum Silver Tabs (Multiple vitamins-minerals) .Marland Kitchen.. 1 tab by mouth once daily 10)  Lorazepam 0.5 Mg Tabs (Lorazepam) .Marland Kitchen.. 1 by mouth for anxiety.Betsy Cerreta will call back to clarify prescribed dose and instructions..mp 11/24/2009 1205 pm  Patient Instructions: 1)  Please schedule a follow-up appointment in 4 months. 2)  Limit your Sodium (Salt) to less than 2 grams a day(slightly less than 1/2 a teaspoon) to prevent fluid retention, swelling, or worsening of symptoms. 3)  It is important that you exercise regularly at least 20 minutes 5 times a week. If you develop chest pain, have severe difficulty breathing, or feel very tired , stop exercising immediately and seek medical attention. 4)  You need to lose weight. Consider a lower calorie diet and regular exercise.  Prescriptions: LORAZEPAM 0.5 MG TABS (LORAZEPAM) 1 by mouth for anxiety.Damani Rando will call back to clarify prescribed dose and instructions..mp 11/24/2009 1205 pm  #90 x 3   Entered and Authorized by:   Gordy Savers  MD   Signed by:   Gordy Savers  MD on 05/27/2010   Method used:   Print then Give to Patient   RxID:   [id number] CARDIZEM CD 120 MG XR24H-CAP (DILTIAZEM HCL COATED BEADS) once daily  #90 x 3   Entered and Authorized by:  Gordy Savers  MD   Signed by:   Gordy Savers  MD on 05/27/2010   Method used:   [facility] to        [id number]       [address]       [place], [place]  [address]       Ph: [phone] or [phone]       Fax: [phone]   RxID:   [id number] COUMADIN 2 MG TABS (WARFARIN SODIUM) Take as directed by coumadin clinic.  #90 Tablet x 3   Entered and Authorized by:   Gordy Savers  MD   Signed by:   Gordy Savers  MD on 05/27/2010   Method used:   [facility]ically to        [facility]  [id number]       [address]       [place], [place]  [address]       Ph: [phone] or [phone]       Fax: [phone]   RxID:   [id number] POTASSIUM CHLORIDE CRYS CR 20 MEQ TBCR (POTASSIUM CHLORIDE CRYS CR) 1 q am, 1/2 at bedtime  #180 Tablet x 2   Entered and Authorized by:   Gordy Savers  MD   Signed by:   Gordy Savers  MD on 05/27/2010   Method used:   [facility] to        [facility]  [id number]       [address]       [place], [place]  [address]       Ph: [phone] or [phone]       Fax: [phone]   RxID:   [id number] METOPROLOL SUCCINATE 50 MG TB24 (METOPROLOL SUCCINATE) 1/2 once daily  #90 x 6   Entered and Authorized by:   Gordy Savers  MD   Signed by:   Gordy Savers  MD on 05/27/2010   Method used:   [facility]ronically to        [facility]  [address] [facility]       [address]       [place], [place]  [address]       Ph: [phone] or [phone]       Fax: [phone]   RxID:   [id number] FUROSEMIDE 40 MG TABS (FUROSEMIDE) 1 q am, 1/2 at bedtime  #180 x 6   Entered and Authorized by:   Gordy Savers  MD   Signed by:   Gordy Savers  MD on 05/27/2010   Method used:   [facility]ically to        [facility]  [address] [id number]       [address]       [place], [place]  [address]       Ph: [phone] or [phone]       Fax: [phone]   RxID:   [id number]

## 2010-11-12 NOTE — Progress Notes (Signed)
Summary: Alprazolam  Phone Note Call from Patient   Caller: Patient Reason for Call: Talk to Nurse Summary of Call: Patient was prescribed Sertraline HCL 25 mg - having difficulty taking med - wants to know if she can stop medication and get something else.  [facility] Initial call taken by: Everrett Coombe,  January 05, 2010 10:17 AM  Follow-up for Phone Call        called in alprazolam , dc'd sertraline , pt aware. Keep appt on 4/10 KIK Follow-up by: Duard Brady LPN,  January 05, 2010 1:35 PM    New/Updated Medications: ALPRAZOLAM 0.25 MG TABS (ALPRAZOLAM) two times a day as needed anxiety or sleep Prescriptions: ALPRAZOLAM 0.25 MG TABS (ALPRAZOLAM) two times a day as needed anxiety or sleep  #50 x 1   Entered by:   Duard Brady LPN   Authorized by:   Gordy Savers  MD   Signed by:   Duard Brady LPN on 16/07/9603   Method used:   Historical   RxID:   [id number]  OK to d/c; try alprazolam 0.25  #50 one twice daily as needed for anxiety or sleep

## 2010-11-12 NOTE — Medication Information (Signed)
Summary: rov/sp  Anticoagulant Therapy  Managed by: Bethena Midget, RN, BSN Referring MD: Charlton Haws MD PCP: Gordy Savers  MD Supervising MD: Johney Frame MD, Fayrene Fearing Indication 1: Atrial Fibrillation (ICD-427.31) Lab Used: [facility] Site: [facility] INR POC 3.1 INR RANGE 2 - 3  Dietary changes: yes       Details: Pt states she ate less green leafy vegies  Health status changes: no    Bleeding/hemorrhagic complications: no    Recent/future hospitalizations: no    Any changes in medication regimen? yes       Details: Taking xanax PRN  Recent/future dental: no  Any missed doses?: no       Is patient compliant with meds? yes       Allergies: 1)  ! Pcn 2)  ! Sulfa 3)  ! Nsaids 4)  ! Codeine  Anticoagulation Management History:      The patient is taking warfarin and comes in today for a routine follow up visit.  Positive risk factors for bleeding include an age of 75 years or older.  The bleeding index is 'intermediate risk'.  Positive CHADS2 values include History of HTN and Age > 2 years old.  The start date was 10/11/2002.  Anticoagulation responsible provider: Allred MD, Fayrene Fearing.  INR POC: 3.1.  Cuvette Lot#: [id number].  Exp: 07/2011.    Anticoagulation Management Assessment/Plan:      The patient's current anticoagulation dose is Coumadin 2 mg tabs: Take as directed by coumadin clinic..  The target INR is 2.0-3.0.  The next INR is due 07/01/2010.  Anticoagulation instructions were given to patient.  Results were reviewed/authorized by Bethena Midget, RN, BSN.  She was notified by Bethena Midget, RN, BSN.         Prior Anticoagulation Instructions: INR 2.4  Continue same dose of 2mg  daily.  Recheck INR in 4 weeks.   Current Anticoagulation Instructions: INR 3.1 Tomorrow take only 1/2 pill then resume 1 pill everyday. Recheck in 4 weeks.

## 2010-11-12 NOTE — Assessment & Plan Note (Signed)
Summary: EPH/JML  Medications Added FUROSEMIDE 40 MG TABS (FUROSEMIDE) 1 tab po bid . POTASSIUM CHLORIDE CRYS CR 20 MEQ TBCR (POTASSIUM CHLORIDE CRYS CR) 1 tab by mouth two times a day      Allergies Added:   Primary Provider:  Gordy Savers  MD  CC:  sob.  History of Present Illness: Bonnie Roberts was recently hospitalized for SOB and palpitations.  She is getting some forgetful and anxious.  She has chronic afib with good rate control and anticoagulation.  INR was 2.3 today.  She had a F/U echo which showed normal EF and only moderate TR.  Chronic LE edema from venous insuficiency.  No palpitaitons, SOB, or SSCP.  No history of CAD.  Still a good coumadin candidate despite age.    Thanked patient for crystla light tree she brought [place] today  Current Problems (verified): 1)  Venous Insufficiency, Chronic  (ICD-459.81) 2)  Anxiety Depression  (ICD-300.4) 3)  Coumadin Therapy  (ICD-V58.61) 4)  Atrial Fibrillation  (ICD-427.31) 5)  Hypertension  (ICD-401.9) 6)  Edema  (ICD-782.3) 7)  Dyspnea  (ICD-786.05) 8)  Hemorrhoid, External, Thrombosed  (ICD-455.4) 9)  Disorder, Menopausal Nos  (ICD-627.9) 10)  Osteoarthritis  (ICD-715.90) 11)  Degenerative Joint Disease  (ICD-715.90) 12)  Colonic Polyps, Hx of  (ICD-V12.72)  Current Medications (verified): 1)  Furosemide 40 Mg Tabs (Furosemide) .Marland Kitchen.. 1 Tab Po Bid . 2)  Metoprolol Succinate 50 Mg Tb24 (Metoprolol Succinate) .... 1/2 Once Daily 3)  Potassium Chloride Crys Cr 20 Meq Tbcr (Potassium Chloride Crys Cr) .Marland Kitchen.. 1 Tab By Mouth Two Times A Day 4)  Coumadin 2 Mg Tabs (Warfarin Sodium) .... Take As Directed By Coumadin Clinic. 5)  Stool Softner .Marland Kitchen.. 1 Tab Two Times A Day 6)  Fish Oil 1000 Mg Caps (Omega-3 Fatty Acids) .Marland Kitchen.. 1 Cap Two Times A Day 7)  Flax Seed Oil 1000 Mg Caps (Flaxseed (Linseed)) .Marland Kitchen.. 1 Cap Once Daily 8)  Cardizem Cd 120 Mg Xr24h-Cap (Diltiazem Hcl Coated Beads) .... Once Daily 9)  Centrum Silver  Tabs (Multiple  Vitamins-Minerals) .Marland Kitchen.. 1 Tab By Mouth Once Daily  Allergies (verified): 1)  ! Pcn 2)  ! Sulfa 3)  ! Nsaids 4)  ! Codeine  Past History:  Past Medical History: Last updated: 03/13/2009 ATRIAL FIBRILLATION (ICD-427.31) HYPERTENSION (ICD-401.9) EDEMA (ICD-782.3) DYSPNEA (ICD-786.05) HEMORRHOID, EXTERNAL, THROMBOSED (ICD-455.4) DISORDER, MENOPAUSAL NOS (ICD-627.9) OSTEOARTHRITIS (ICD-715.90) DEGENERATIVE JOINT DISEASE (ICD-715.90) COLONIC POLYPS, HX OF (ICD-V12.72)    Past Surgical History: Last updated: 04/10/2007 Carpal tunnel release-right Cholecystectomy-1967 Hysterectomy-1959 Total knee replacement-right-2003 Rectocele/bladder tacking-1996 hernia repair-left G 4 P 4 AB 0 Heart cath-2006 Colonoscopy-05/2004  Family History: Last updated: 05/15/2007 Family History Lung cancer Family History Other cancer-throat, colon Fam hx Cerebrovascular dx father died at 89, cerebrall vascular disease also had a history of throat cancer.  Mother died at 12 two brothers for lung cancer, colon cancer two sisters positive for lung cancer  Social History: Last updated: 05/27/2010 Former Smoker Alcohol use-no Widowed x2 Handicapped daughter lives with her 2 other living sons- one with metastatic lung cancer  Review of Systems       Denies fever, malais, weight loss, blurry vision, decreased visual acuity, cough, sputum, SOB, hemoptysis, pleuritic pain, palpitaitons, heartburn, abdominal pain, melena, l claudication, or rash.   Vital Signs:  Patient profile:   77 year old female Height:      61 inches Weight:      178 pounds BMI:     33.75 Pulse rate:  80 / minute Pulse rhythm:   irregularly irregular Resp:     12 per minute BP sitting:   142 / 80  (left arm)  Vitals Entered By: Kem Parkinson (August 19, 2010 8:46 AM)  Physical Exam  General:  Affect appropriate Healthy:  appears stated age HEENT: normal Neck supple with no adenopathy JVP normal no bruits  no thyromegaly Lungs clear with no wheezing and good diaphragmatic motion Heart:  S1/S2 no murmur,rub, gallop or click PMI normal Abdomen: benighn, BS positve, no tenderness, no AAA no bruit.  No HSM or HJR Distal pulses intact with no bruits Plus one bilateral  edema Neuro non-focal Skin warm and dry    Impression & Recommendations:  Problem # 1:  VENOUS INSUFFICIENCY, CHRONIC (ICD-459.81) Stable continue current dose of diuretic  Problem # 2:  COUMADIN THERAPY (ICD-V58.61) INR Rx f/U clinic one month  Problem # 3:  ATRIAL FIBRILLATION (ICD-427.31) Good rate control and anticoagulaiton Her updated medication list for this problem includes:    Metoprolol Succinate 50 Mg Tb24 (Metoprolol succinate) .Marland Kitchen... 1/2 once daily    Coumadin 2 Mg Tabs (Warfarin sodium) .Marland Kitchen... Take as directed by coumadin clinic.  Problem # 4:  DYSPNEA (ICD-786.05) Possible diastolic dysfunction although anxiety plays a role.  EF normal by echo.  No history of CAD.  Afib with good rate control.  Medical Rx Her updated medication list for this problem includes:    Furosemide 40 Mg Tabs (Furosemide) .Marland Kitchen... 1 tab po bid .    Metoprolol Succinate 50 Mg Tb24 (Metoprolol succinate) .Marland Kitchen... 1/2 once daily    Cardizem Cd 120 Mg Xr24h-cap (Diltiazem hcl coated beads) ..... Once daily  Patient Instructions: 1)  Your physician wants you to follow-up in:   6 months You will receive a reminder letter in the mail two months in advance. If you don't receive a letter, please call our office to schedule the follow-up appointment.

## 2010-11-12 NOTE — Medication Information (Signed)
Summary: rov/sel   Anticoagulant Therapy  Managed by: Reina Fuse, PharmD Referring MD: Charlton Haws MD PCP: Gordy Savers  MD Supervising MD: Cassell Clement Indication 1: Atrial Fibrillation (ICD-427.31) Lab Used: [facility] Site: [facility] INR POC 2.3 INR RANGE 2 - 3  Dietary changes: no    Health status changes: no    Bleeding/hemorrhagic complications: no    Recent/future hospitalizations: no    Any changes in medication regimen? no    Recent/future dental: no  Any missed doses?: no       Is patient compliant with meds? yes      Comments: 2 weeks ago went on a trip to see son and mixed up medications.   Allergies: 1)  ! Pcn 2)  ! Sulfa 3)  ! Nsaids 4)  ! Codeine  Anticoagulation Management History:      The patient is taking warfarin and comes in today for a routine follow up visit.  Positive risk factors for bleeding include an age of 75 years or older.  The bleeding index is 'intermediate risk'.  Positive CHADS2 values include History of HTN and Age > 75 years old.  The start date was 10/11/2002.  Anticoagulation responsible provider: Cassell Clement.  INR POC: 2.3.  Cuvette Lot#: [id number].  Exp: 08/2011.    Anticoagulation Management Assessment/Plan:      The patient's current anticoagulation dose is Coumadin 2 mg tabs: Take as directed by coumadin clinic..  The target INR is 2.0-3.0.  The next INR is due 09/16/2010.  Anticoagulation instructions were given to patient.  Results were reviewed/authorized by Reina Fuse, PharmD.  She was notified by Reina Fuse PharmD.         Prior Anticoagulation Instructions: INR 2.6  Continue taking 1 tablet everyday except 1/2 tablet on Monday. Recheck in 4 weeks.   Current Anticoagulation Instructions: INR 2.3  Continue taking Coumadin 1 tab (2 mg) on all days except Coumadin 0.5 tab (1 mg) on Mondays. Return to clinic in 4 weeks.

## 2010-11-12 NOTE — Assessment & Plan Note (Signed)
Summary: 6 wk rov/njr   Vital Signs:  Patient profile:   77 year old female Weight:      174 pounds Temp:     98.1 degrees F oral BP sitting:   122 / 80  (left arm) Cuff size:   regular CC: 6 wk rov - doing ok but concerned about lower leg swelling and inner ankle discolorations Is Patient Diabetic? No   CC:  6 wk rov - doing ok but concerned about lower leg swelling and inner ankle discolorations.  History of Present Illness: an 77 year old patient has a history of chronic venous insufficiency, chronicatrial fibrillation,  and on chronic Coumadin therapy.  She is concerned about a slight discoloration involving her right medial ankle region.  Clinically, she has done quite well.  There has been some situational stress due to the recent diagnosis of her son with lung cancer.  She is followed at the Coumadin clinic.  She has treated hypertension  Allergies: 1)  ! Flagyl 2)  ! Pcn 3)  ! Sulfa 4)  ! Nsaids 5)  ! Codeine  Past History:  Past Medical History: Reviewed history from 03/13/2009 and no changes required. ATRIAL FIBRILLATION (ICD-427.31) HYPERTENSION (ICD-401.9) EDEMA (ICD-782.3) DYSPNEA (ICD-786.05) HEMORRHOID, EXTERNAL, THROMBOSED (ICD-455.4) DISORDER, MENOPAUSAL NOS (ICD-627.9) OSTEOARTHRITIS (ICD-715.90) DEGENERATIVE JOINT DISEASE (ICD-715.90) COLONIC POLYPS, HX OF (ICD-V12.72)    Review of Systems       The patient complains of peripheral edema and suspicious skin lesions.  The patient denies anorexia, fever, weight loss, weight gain, vision loss, decreased hearing, hoarseness, chest pain, syncope, dyspnea on exertion, prolonged cough, headaches, hemoptysis, abdominal pain, melena, hematochezia, severe indigestion/heartburn, hematuria, incontinence, genital sores, muscle weakness, transient blindness, difficulty walking, depression, unusual weight change, abnormal bleeding, enlarged lymph nodes, angioedema, and breast masses.    Physical Exam  General:   overweight-appearing.  normal blood pressureoverweight-appearing.   Neck:  no neck vein distention Lungs:  clear Heart:  irregular with a controlled ventricular response Extremities:  2+ left pedal edema and 1+ right pedal edema.  slight discoloration involving her right medial ankle region2+ left pedal edema.     Impression & Recommendations:  Problem # 1:  ATRIAL FIBRILLATION (ICD-427.31)  Her updated medication list for this problem includes:    Metoprolol Succinate 50 Mg Tb24 (Metoprolol succinate) .Marland Kitchen... 1/2 once daily    Coumadin 2 Mg Tabs (Warfarin sodium) .Marland Kitchen... Take as directed by coumadin clinic.    Cardizem Cd 120 Mg Xr24h-cap (Diltiazem hcl coated beads) ..... Once daily  Her updated medication list for this problem includes:    Metoprolol Succinate 50 Mg Tb24 (Metoprolol succinate) .Marland Kitchen... 1/2 once daily    Coumadin 2 Mg Tabs (Warfarin sodium) .Marland Kitchen... Take as directed by coumadin clinic.    Cardizem Cd 120 Mg Xr24h-cap (Diltiazem hcl coated beads) ..... Once daily  Problem # 2:  HYPERTENSION (ICD-401.9)  Her updated medication list for this problem includes:    Furosemide 40 Mg Tabs (Furosemide) .Marland Kitchen... 1 q am, 1/2 at bedtime    Metoprolol Succinate 50 Mg Tb24 (Metoprolol succinate) .Marland Kitchen... 1/2 once daily    Cardizem Cd 120 Mg Xr24h-cap (Diltiazem hcl coated beads) ..... Once daily  Her updated medication list for this problem includes:    Furosemide 40 Mg Tabs (Furosemide) .Marland Kitchen... 1 q am, 1/2 at bedtime    Metoprolol Succinate 50 Mg Tb24 (Metoprolol succinate) .Marland Kitchen... 1/2 once daily    Cardizem Cd 120 Mg Xr24h-cap (Diltiazem hcl coated beads) ..... Once daily  Problem #  3:  VENOUS INSUFFICIENCY, CHRONIC (ICD-459.81) patient appears as a mild stasis dermatitis.  She was reassured  Complete Medication List: 1)  Furosemide 40 Mg Tabs (Furosemide) .Marland Kitchen.. 1 q am, 1/2 at bedtime 2)  Metoprolol Succinate 50 Mg Tb24 (Metoprolol succinate) .... 1/2 once daily 3)  Potassium Chloride Crys  Cr 20 Meq Tbcr (Potassium chloride crys cr) .Marland Kitchen.. 1 q am, 1/2 at bedtime 4)  Coumadin 2 Mg Tabs (Warfarin sodium) .... Take as directed by coumadin clinic. 5)  Stool Softner  .Marland Kitchen.. 1 tab two times a day 6)  Fish Oil 1000 Mg Caps (Omega-3 fatty acids) .Marland Kitchen.. 1 cap two times a day 7)  Flax Seed Oil 1000 Mg Caps (Flaxseed (linseed)) .Marland Kitchen.. 1 cap once daily 8)  Cardizem Cd 120 Mg Xr24h-cap (Diltiazem hcl coated beads) .... Once daily 9)  Clobetasol Propionate 0.05 % Crea (Clobetasol propionate) .... Use daily as directed 10)  Triamcinolone Acetonide 0.025 % Crea (Triamcinolone acetonide) .... Use after shower as directed 11)  Centrum Silver Tabs (Multiple vitamins-minerals) .Marland Kitchen.. 1 tab by mouth once daily 12)  Flax Oil (Flaxseed (linseed)) .Marland Kitchen.. 1 tab by mouth once daily 13)  Lorazepam 0.5 Mg Tabs (Lorazepam) .Marland Kitchen.. 1 by mouth for anxiety.Korena Brookshire will call back to clarify prescribed dose and instructions..mp 11/24/2009 1205 pm 14)  Alprazolam 0.25 Mg Tabs (Alprazolam) .... Two times a day as needed anxiety or sleep 15)  B Complex Vitamins Caps (B complex vitamins) .... Take 1 daily  Patient Instructions: 1)  Please schedule a follow-up appointment in 4 months. 2)  Limit your Sodium (Salt). 3)  It is important that you exercise regularly at least 20 minutes 5 times a week. If you develop chest pain, have severe difficulty breathing, or feel very tired , stop exercising immediately and seek medical attention. 4)  You need to lose weight. Consider a lower calorie diet and regular exercise.

## 2010-11-12 NOTE — Medication Information (Signed)
Summary: rov/tm  Anticoagulant Therapy  Managed by: Weston Brass, PharmD Referring MD: Charlton Haws MD PCP: Gordy Savers  MD Supervising MD: Juanda Chance MD, Bruce Indication 1: Atrial Fibrillation (ICD-427.31) Lab Used: [facility] Site: [facility] INR POC 3.3 INR RANGE 2 - 3  Dietary changes: yes       Details: ate extra extra greens  Health status changes: yes       Details: had tightness in chest and SOB. pain was not radiating, but lasted for several days.  Has since resolved.    Bleeding/hemorrhagic complications: no    Recent/future hospitalizations: no    Any changes in medication regimen? yes       Details: taking lorazepam regularly for the past week.  Chest pain appears anxiety related due to recent passing of her son.   Recent/future dental: no  Any missed doses?: no       Is patient compliant with meds? yes       Allergies: 1)  ! Pcn 2)  ! Sulfa 3)  ! Nsaids 4)  ! Codeine  Anticoagulation Management History:      The patient is taking warfarin and comes in today for a routine follow up visit.  Positive risk factors for bleeding include an age of 75 years or older.  The bleeding index is 'intermediate risk'.  Positive CHADS2 values include History of HTN and Age > 25 years old.  The start date was 10/11/2002.  Anticoagulation responsible provider: Juanda Chance MD, Smitty Cords.  INR POC: 3.3.  Cuvette Lot#: [id number].  Exp: 08/2011.    Anticoagulation Management Assessment/Plan:      The patient's current anticoagulation dose is Coumadin 2 mg tabs: Take as directed by coumadin clinic..  The target INR is 2.0-3.0.  The next INR is due 07/22/2010.  Anticoagulation instructions were given to patient.  Results were reviewed/authorized by Weston Brass, PharmD.  She was notified by Kennieth Francois.         Prior Anticoagulation Instructions: INR 3.1 Tomorrow take only 1/2 pill then resume 1 pill everyday. Recheck in 4 weeks.   Current Anticoagulation Instructions: INR 3.3  Skip  tomorrow's dose, then begin new dose of: one tablet every day except for one-half tablet on Monday.  Recheck in three weeks.

## 2010-11-12 NOTE — Progress Notes (Signed)
Summary: needs new rx   Phone Note Call from Patient Call back at Home Phone [phone]   Caller: female----voicemail. Summary of Call: pt just found  out that her son has cancer and is upset about it. Needs rx to help her start crying. [facility]. Initial call taken by: Warnell Forester,  November 06, 2009 9:51 AM  Follow-up for Phone Call        lorazapam 0.5  #50 one twice daily as needed Follow-up by: Gordy Savers  MD,  November 06, 2009 11:10 AM  Additional Follow-up for Phone Call Additional follow up Details #1::        Rx Called In Additional Follow-up by: Raechel Ache, RN,  November 06, 2009 11:13 AM

## 2010-11-12 NOTE — Medication Information (Signed)
Summary: rov coumadin - [facility]  Anticoagulant Therapy  Managed by: Elaina Pattee, PharmD Referring MD: Charlton Haws MD Supervising MD: Myrtis Ser MD, Tinnie Gens Indication 1: Atrial Fibrillation (ICD-427.31) Lab Used: [facility] Site: [facility] INR POC 2.3 INR RANGE 2 - 3  Dietary changes: yes       Details: Pt on vacation last week, so diet was a little different.  Health status changes: no    Bleeding/hemorrhagic complications: no    Recent/future hospitalizations: no    Any changes in medication regimen? no    Recent/future dental: no  Any missed doses?: no       Is patient compliant with meds? yes       Allergies: 1)  ! Pcn 2)  ! Sulfa 3)  ! Nsaids 4)  ! Codeine  Anticoagulation Management History:      The patient is taking warfarin and comes in today for a routine follow up visit.  Positive risk factors for bleeding include an age of 65 years or older.  The bleeding index is 'intermediate risk'.  Positive CHADS2 values include History of HTN and Age > 61 years old.  The start date was 10/11/2002.  Anticoagulation responsible provider: Myrtis Ser MD, Tinnie Gens.  INR POC: 2.3.  Cuvette Lot#: [id number].  Exp: 05/2011.    Anticoagulation Management Assessment/Plan:      The patient's current anticoagulation dose is Coumadin 2 mg tabs: Take as directed by coumadin clinic..  The target INR is 2.0-3.0.  The next INR is due 04/08/2010.  Anticoagulation instructions were given to patient.  Results were reviewed/authorized by Elaina Pattee, PharmD.  She was notified by Elaina Pattee, PharmD.         Prior Anticoagulation Instructions: INR 2.2  Coumadin 2mg  = 1 tab each day  Current Anticoagulation Instructions: INR 2.3. Take 1 tablet daily.

## 2010-11-12 NOTE — Medication Information (Signed)
Summary: rov/ewj  Anticoagulant Therapy  Managed by: Bethena Midget, RN, BSN Referring MD: Charlton Haws MD Supervising MD: Graciela Husbands MD, Viviann Spare Indication 1: Atrial Fibrillation (ICD-427.31) Lab Used: [facility] Site: [facility] INR POC 2.4 INR RANGE 2 - 3  Dietary changes: no    Health status changes: no    Bleeding/hemorrhagic complications: no    Recent/future hospitalizations: no    Any changes in medication regimen? no    Recent/future dental: no  Any missed doses?: no       Is patient compliant with meds? yes       Current Medications (verified): 1)  Furosemide 40 Mg Tabs (Furosemide) .Marland Kitchen.. 1 Q Am, 1/2 At Bedtime 2)  Metoprolol Succinate 50 Mg Tb24 (Metoprolol Succinate) .... 1/2 Once Daily 3)  Potassium Chloride Crys Cr 20 Meq Tbcr (Potassium Chloride Crys Cr) .Marland Kitchen.. 1 Q Am, 1/2 At Bedtime 4)  Coumadin 2 Mg Tabs (Warfarin Sodium) .... Take As Directed By Coumadin Clinic. 5)  Stool Softner .Marland Kitchen.. 1 Tab Two Times A Day 6)  Fish Oil 1000 Mg Caps (Omega-3 Fatty Acids) .Marland Kitchen.. 1 Cap Two Times A Day 7)  Flax Seed Oil 1000 Mg Caps (Flaxseed (Linseed)) .Marland Kitchen.. 1 Cap Once Daily 8)  Cardizem Cd 120 Mg Xr24h-Cap (Diltiazem Hcl Coated Beads) .... Once Daily 9)  Clobetasol Propionate 0.05 % Crea (Clobetasol Propionate) .... Use Daily As Directed 10)  Triamcinolone Acetonide 0.025 % Crea (Triamcinolone Acetonide) .... Use After Shower As Directed 11)  Centrum Silver  Tabs (Multiple Vitamins-Minerals) .Marland Kitchen.. 1 Tab By Mouth Once Daily 12)  Flax   Oil (Flaxseed (Linseed)) .Marland Kitchen.. 1 Tab By Mouth Once Daily 13)  Lorazepam 0.5 Mg Tabs (Lorazepam) .Marland Kitchen.. 1 By Mouth For Anxiety.Deniz Pharris Will Call Back To Clarify Prescribed Dose and Instructions..mp 11/24/2009 1205 Pm 14)  Alprazolam 0.25 Mg Tabs (Alprazolam) .... Two Times A Day As Needed Anxiety or Sleep 15)  B Complex Vitamins  Caps (B Complex Vitamins) .... Take 1 Daily  Allergies: 1)  ! Flagyl 2)  ! Pcn 3)  ! Sulfa 4)  ! Nsaids 5)  !  Codeine  Anticoagulation Management History:      The patient is taking warfarin and comes in today for a routine follow up visit.  Positive risk factors for bleeding include an age of 75 years or older.  The bleeding index is 'intermediate risk'.  Positive CHADS2 values include History of HTN and Age > 75 years old.  The start date was 10/11/2002.  Anticoagulation responsible provider: Graciela Husbands MD, Viviann Spare.  INR POC: 2.4.  Cuvette Lot#: [id number].  Exp: 02/2011.    Anticoagulation Management Assessment/Plan:      The patient's current anticoagulation dose is Coumadin 2 mg tabs: Take as directed by coumadin clinic..  The target INR is 2.0-3.0.  The next INR is due 02/10/2010.  Anticoagulation instructions were given to patient.  Results were reviewed/authorized by Bethena Midget, RN, BSN.  She was notified by Bethena Midget, RN, BSN.         Prior Anticoagulation Instructions: INR 2.2  Continue on same dosage 2mg  daily.  Recheck in 4 weeks.    Current Anticoagulation Instructions: INR 2.4 Continue 2mg s everyday. Recheck in 4 weeks.

## 2010-11-12 NOTE — Medication Information (Signed)
Summary: rov/cb  Anticoagulant Therapy  Managed by: Elaina Pattee, PharmD Referring MD: Charlton Haws MD PCP: Gordy Savers  MD Supervising MD: Riley Kill MD, Maisie Fus Indication 1: Atrial Fibrillation (ICD-427.31) Lab Used: [facility] Site: [facility] INR POC 2.6 INR RANGE 2 - 3  Dietary changes: yes       Details: Has eaten fewer greens.  Health status changes: no    Bleeding/hemorrhagic complications: no    Recent/future hospitalizations: no    Any changes in medication regimen? no    Recent/future dental: no  Any missed doses?: no       Is patient compliant with meds? yes       Allergies: 1)  ! Pcn 2)  ! Sulfa 3)  ! Nsaids 4)  ! Codeine  Anticoagulation Management History:      The patient is taking warfarin and comes in today for a routine follow up visit.  Positive risk factors for bleeding include an age of 75 years or older.  The bleeding index is 'intermediate risk'.  Positive CHADS2 values include History of HTN and Age > 48 years old.  The start date was 10/11/2002.  Anticoagulation responsible provider: Riley Kill MD, Maisie Fus.  INR POC: 2.6.  Cuvette Lot#: [id number].  Exp: 05/2011.    Anticoagulation Management Assessment/Plan:      The patient's current anticoagulation dose is Coumadin 2 mg tabs: Take as directed by coumadin clinic..  The target INR is 2.0-3.0.  The next INR is due 05/06/2010.  Anticoagulation instructions were given to patient.  Results were reviewed/authorized by Elaina Pattee, PharmD.  She was notified by Elaina Pattee, PharmD.         Prior Anticoagulation Instructions: INR 2.3. Take 1 tablet daily.  Current Anticoagulation Instructions: INR 2.6. Take 1 tablet daily. Recheck in 4 weeks.

## 2010-11-12 NOTE — Progress Notes (Signed)
Summary: c/o heart fluttering    Phone Note Call from Patient Call back at Home Phone [phone]   Caller: Patient Reason for Call: Talk to Nurse Summary of Call: c/o heart fluttering for several day. worse this am.  Initial call taken by: Lorne Skeens,  July 13, 2010 8:54 AM  Follow-up for Phone Call        Pt neighbor also called.  She is with patient who says her b/p is 148/80 and feels like her heart is racing.  She calls to let us know she will be taking Mrs. Hentz to [facility] ED.  Mylo Red RN

## 2010-11-12 NOTE — Letter (Signed)
Summary: Handout Printed  Printed Handout:  - Coumadin Instructions-w/out Meds 

## 2010-11-12 NOTE — Assessment & Plan Note (Signed)
Summary: 4 mo rov/mm   Vital Signs:  Patient profile:   77 year old female Weight:      177 pounds Temp:     98.4 degrees F oral BP sitting:   120 / 78  (right arm) Cuff size:   large  Vitals Entered By: Duard Brady LPN (December 12, 979 10:41 AM) CC: rov - doing ok - still with incontinence - urniary Is Patient Diabetic? No   CC:  rov - doing ok - still with incontinence - urniary.  History of Present Illness:  77 year old patient who has a history of chronic atrial fibrillation  on chronic Coumadin therapy.  She is followed at the Coumadin clinic.  She has hypertension, osteoarthritis, and anxiety disorder.  Her son was diagnosed with lung cancer approximately 2 or 3 weeks ago, and she is significant stress.  Her hypertension has been under nice control.  She has a sleeping poorly and crying considerably due to the stress  Allergies: 1)  ! Flagyl 2)  ! Pcn 3)  ! Sulfa 4)  ! Nsaids 5)  ! Codeine  Past History:  Past Medical History: Reviewed history from 03/13/2009 and no changes required. ATRIAL FIBRILLATION (ICD-427.31) HYPERTENSION (ICD-401.9) EDEMA (ICD-782.3) DYSPNEA (ICD-786.05) HEMORRHOID, EXTERNAL, THROMBOSED (ICD-455.4) DISORDER, MENOPAUSAL NOS (ICD-627.9) OSTEOARTHRITIS (ICD-715.90) DEGENERATIVE JOINT DISEASE (ICD-715.90) COLONIC POLYPS, HX OF (ICD-V12.72)    Family History: Reviewed history from 05/15/2007 and no changes required. Family History Lung cancer Family History Other cancer-throat, colon Fam hx Cerebrovascular dx father died at 75, cerebrall vascular disease also had a history of throat cancer.  Mother died at 58 two brothers for lung cancer, colon cancer two sisters positive for lung cancer  Review of Systems       The patient complains of anorexia, peripheral edema, and depression.  The patient denies fever, weight loss, weight gain, vision loss, decreased hearing, hoarseness, chest pain, syncope, dyspnea on exertion, prolonged  cough, headaches, hemoptysis, abdominal pain, melena, hematochezia, severe indigestion/heartburn, hematuria, incontinence, genital sores, muscle weakness, suspicious skin lesions, transient blindness, difficulty walking, unusual weight change, abnormal bleeding, enlarged lymph nodes, angioedema, and breast masses.    Physical Exam  General:  obese tearful, normal.  Blood pressure Head:  Normocephalic and atraumatic without obvious abnormalities. No apparent alopecia or balding. Eyes:  No corneal or conjunctival inflammation noted. EOMI. Perrla. Funduscopic exam benign, without hemorrhages, exudates or papilledema. Vision grossly normal. Mouth:  Oral mucosa and oropharynx without lesions or exudates.  Teeth in good repair. Neck:  No deformities, masses, or tenderness noted. Lungs:  Normal respiratory effort, chest expands symmetrically. Lungs are clear to auscultation, no crackles or wheezes. Heart:  irregular rhythm, with a controlled ventricular response Abdomen:  Bowel sounds positive,abdomen soft and non-tender without masses, organomegaly or hernias noted. Msk:  No deformity or scoliosis noted of thoracic or lumbar spine.   Pulses:  R and L carotid,radial,femoral,dorsalis pedis and posterior tibial pulses are full and equal bilaterally Extremities:  1+ left pedal edema and 1+ right pedal edema.  1+ left pedal edema.     Impression & Recommendations:  Problem # 1:  ATRIAL FIBRILLATION (ICD-427.31)  Her updated medication list for this problem includes:    Metoprolol Succinate 50 Mg Tb24 (Metoprolol succinate) .Marland Kitchen... 1/2 once daily    Coumadin 2 Mg Tabs (Warfarin sodium) .Marland Kitchen... Take as directed by coumadin clinic.    Cardizem Cd 120 Mg Xr24h-cap (Diltiazem hcl coated beads) ..... Once daily  Her updated medication list for this problem  includes:    Metoprolol Succinate 50 Mg Tb24 (Metoprolol succinate) .Marland Kitchen... 1/2 once daily    Coumadin 2 Mg Tabs (Warfarin sodium) .Marland Kitchen... Take as directed by  coumadin clinic.    Cardizem Cd 120 Mg Xr24h-cap (Diltiazem hcl coated beads) ..... Once daily  Problem # 2:  HYPERTENSION (ICD-401.9)  Her updated medication list for this problem includes:    Furosemide 40 Mg Tabs (Furosemide) .Marland Kitchen... 1 q am, 1/2 at bedtime    Metoprolol Succinate 50 Mg Tb24 (Metoprolol succinate) .Marland Kitchen... 1/2 once daily    Cardizem Cd 120 Mg Xr24h-cap (Diltiazem hcl coated beads) ..... Once daily  Her updated medication list for this problem includes:    Furosemide 40 Mg Tabs (Furosemide) .Marland Kitchen... 1 q am, 1/2 at bedtime    Metoprolol Succinate 50 Mg Tb24 (Metoprolol succinate) .Marland Kitchen... 1/2 once daily    Cardizem Cd 120 Mg Xr24h-cap (Diltiazem hcl coated beads) ..... Once daily  Problem # 3:  EDEMA (ICD-782.3)  Her updated medication list for this problem includes:    Furosemide 40 Mg Tabs (Furosemide) .Marland Kitchen... 1 q am, 1/2 at bedtime  Her updated medication list for this problem includes:    Furosemide 40 Mg Tabs (Furosemide) .Marland Kitchen... 1 q am, 1/2 at bedtime  Problem # 4:  ANXIETY DEPRESSION (ICD-300.4) Zoloft 25 mg daily  Complete Medication List: 1)  Furosemide 40 Mg Tabs (Furosemide) .Marland Kitchen.. 1 q am, 1/2 at bedtime 2)  Metoprolol Succinate 50 Mg Tb24 (Metoprolol succinate) .... 1/2 once daily 3)  Potassium Chloride Crys Cr 20 Meq Tbcr (Potassium chloride crys cr) .Marland Kitchen.. 1 q am, 1/2 at bedtime 4)  Coumadin 2 Mg Tabs (Warfarin sodium) .... Take as directed by coumadin clinic. 5)  Stool Softner  .Marland Kitchen.. 1 tab two times a day 6)  Fish Oil 1000 Mg Caps (Omega-3 fatty acids) .Marland Kitchen.. 1 cap two times a day 7)  Flax Seed Oil 1000 Mg Caps (Flaxseed (linseed)) .Marland Kitchen.. 1 cap once daily 8)  Cardizem Cd 120 Mg Xr24h-cap (Diltiazem hcl coated beads) .... Once daily 9)  Clobetasol Propionate 0.05 % Crea (Clobetasol propionate) .... Use daily as directed 10)  Triamcinolone Acetonide 0.025 % Crea (Triamcinolone acetonide) .... Use after shower as directed 11)  Centrum Silver Tabs (Multiple vitamins-minerals)  .Marland Kitchen.. 1 tab by mouth once daily 12)  Flax Oil (Flaxseed (linseed)) .Marland Kitchen.. 1 tab by mouth once daily 13)  Lorazepam 0.5 Mg Tabs (Lorazepam) .Marland Kitchen.. 1 by mouth for anxiety.Gailene Steinbach will call back to clarify prescribed dose and instructions..mp 11/24/2009 1205 pm 14)  Sertraline Hcl 25 Mg Tabs (Sertraline hcl) .... One daily  Patient Instructions: 1)  Please schedule a follow-up appointment in 6 weeks 2)  It is important that you exercise regularly at least 20 minutes 5 times a week. If you develop chest pain, have severe difficulty breathing, or feel very tired , stop exercising immediately and seek medical attention. 3)  You need to lose weight. Consider a lower calorie diet and regular exercise.  Prescriptions: SERTRALINE HCL 25 MG TABS (SERTRALINE HCL) one daily  #90 x 3   Entered and Authorized by:   Gordy Savers  MD   Signed by:   Gordy Savers  MD on 12/11/2009   Method used:   Print then Give to Patient   RxID:   [id number] LORAZEPAM 0.5 MG TABS (LORAZEPAM) 1 by mouth for anxiety.Emanie Behan will call back to clarify prescribed dose and instructions..mp 11/24/2009 1205 pm  #90 x 3   Entered  and Authorized by:   Gordy Savers  MD   Signed by:   Gordy Savers  MD on 12/11/2009   Method used:   Print then Give to Patient   RxID:   [id number] CARDIZEM CD 120 MG XR24H-CAP (DILTIAZEM HCL COATED BEADS) once daily  #90 x 3   Entered and Authorized by:   Gordy Savers  MD   Signed by:   Gordy Savers  MD on 12/11/2009   Method used:   Print then Give to Patient   RxID:   [id number] POTASSIUM CHLORIDE CRYS CR 20 MEQ TBCR (POTASSIUM CHLORIDE CRYS CR) 1 q am, 1/2 at bedtime  #180 Tablet x 2   Entered and Authorized by:   Gordy Savers  MD   Signed by:   Gordy Savers  MD on 12/11/2009   Method used:   Print then Give to Patient   RxID:   [id number] METOPROLOL SUCCINATE 50 MG TB24 (METOPROLOL SUCCINATE) 1/2 once daily  #90  x 6   Entered and Authorized by:   Gordy Savers  MD   Signed by:   Gordy Savers  MD on 12/11/2009   Method used:   Print then Give to Patient   RxID:   [id number] FUROSEMIDE 40 MG TABS (FUROSEMIDE) 1 q am, 1/2 at bedtime  #180 x 6   Entered and Authorized by:   Gordy Savers  MD   Signed by:   Gordy Savers  MD on 12/11/2009   Method used:   Print then Give to Patient   RxID:   [id number]

## 2010-11-12 NOTE — Medication Information (Signed)
Summary: Bonnie Roberts  Anticoagulant Therapy  Managed by: Cloyde Reams, RN, BSN Referring MD: Charlton Haws MD Supervising MD: Ladona Ridgel MD, Sharlot Gowda Indication 1: Atrial Fibrillation (ICD-427.31) Lab Used: [facility] Site: [facility] INR POC 2.2 INR RANGE 2 - 3  Dietary changes: no    Health status changes: no    Bleeding/hemorrhagic complications: no    Recent/future hospitalizations: no    Any changes in medication regimen? yes       Details: Sertraline 25mg  qd.  Recent/future dental: no  Any missed doses?: no       Is patient compliant with meds? yes       Allergies: 1)  ! Flagyl 2)  ! Pcn 3)  ! Sulfa 4)  ! Nsaids 5)  ! Codeine  Anticoagulation Management History:      The patient is taking warfarin and comes in today for a routine follow up visit.  Positive risk factors for bleeding include an age of 49 years or older.  The bleeding index is 'intermediate risk'.  Positive CHADS2 values include History of HTN and Age > 32 years old.  The start date was 10/11/2002.  Anticoagulation responsible provider: Ladona Ridgel MD, Sharlot Gowda.  INR POC: 2.2.  Cuvette Lot#: [id number].  Exp: 02/2011.    Anticoagulation Management Assessment/Plan:      The patient's current anticoagulation dose is Coumadin 2 mg tabs: Take as directed by coumadin clinic..  The target INR is 2.0-3.0.  The next INR is due 01/13/2010.  Anticoagulation instructions were given to patient.  Results were reviewed/authorized by Cloyde Reams, RN, BSN.  She was notified by Cloyde Reams RN.         Prior Anticoagulation Instructions: INR 3.3  Skip 1 day then resume 1 tab daily.  Recheck in 3 - 4 weeks.      Current Anticoagulation Instructions: INR 2.2  Continue on same dosage 2mg  daily.  Recheck in 4 weeks.

## 2010-11-12 NOTE — Medication Information (Signed)
Summary: rov/tm  Anticoagulant Therapy  Managed by: Charolotte Eke, PharmD Referring MD: Charlton Haws MD Supervising MD: Ladona Ridgel MD, Sharlot Gowda Indication 1: Atrial Fibrillation (ICD-427.31) Lab Used: [facility] Site: [facility] INR POC 2.4 INR RANGE 2 - 3  Dietary changes: no    Health status changes: no    Bleeding/hemorrhagic complications: no    Recent/future hospitalizations: no    Any changes in medication regimen? no    Recent/future dental: no  Any missed doses?: yes     Details: one dose 5 days ago.  Is patient compliant with meds? yes       Current Medications (verified): 1)  Furosemide 40 Mg Tabs (Furosemide) .Marland Kitchen.. 1 Q Am, 1/2 At Bedtime 2)  Metoprolol Succinate 50 Mg Tb24 (Metoprolol Succinate) .... 1/2 Once Daily 3)  Potassium Chloride Crys Cr 20 Meq Tbcr (Potassium Chloride Crys Cr) .Marland Kitchen.. 1 Q Am, 1/2 At Bedtime 4)  Coumadin 2 Mg Tabs (Warfarin Sodium) .... Take As Directed By Coumadin Clinic. 5)  Stool Softner .Marland Kitchen.. 1 Tab Two Times A Day 6)  Fish Oil 1000 Mg Caps (Omega-3 Fatty Acids) .Marland Kitchen.. 1 Cap Two Times A Day 7)  Flax Seed Oil 1000 Mg Caps (Flaxseed (Linseed)) .Marland Kitchen.. 1 Cap Once Daily 8)  Cardizem Cd 120 Mg Xr24h-Cap (Diltiazem Hcl Coated Beads) .... Once Daily 9)  Clobetasol Propionate 0.05 % Crea (Clobetasol Propionate) .... Use Daily As Directed 10)  Triamcinolone Acetonide 0.025 % Crea (Triamcinolone Acetonide) .... Use After Shower As Directed 11)  Centrum Silver  Tabs (Multiple Vitamins-Minerals) .Marland Kitchen.. 1 Tab By Mouth Once Daily 12)  Flax   Oil (Flaxseed (Linseed)) .Marland Kitchen.. 1 Tab By Mouth Once Daily  Allergies (verified): 1)  ! Flagyl 2)  ! Pcn 3)  ! Sulfa 4)  ! Nsaids 5)  ! Codeine  Anticoagulation Management History:      The patient is taking warfarin and comes in today for a routine follow up visit.  Positive risk factors for bleeding include an age of 30 years or older.  The bleeding index is 'intermediate risk'.  Positive CHADS2 values include History  of HTN and Age > 12 years old.  The start date was 10/11/2002.  Anticoagulation responsible provider: Ladona Ridgel MD, Sharlot Gowda.  INR POC: 2.4.  Cuvette Lot#: [id number].  Exp: 01/2011.    Anticoagulation Management Assessment/Plan:      The patient's current anticoagulation dose is Coumadin 2 mg tabs: Take as directed by coumadin clinic..  The target INR is 2.0-3.0.  The next INR is due 11/20/2009.  Anticoagulation instructions were given to patient.  Results were reviewed/authorized by Charolotte Eke, PharmD.  She was notified by Charolotte Eke, PharmD.         Prior Anticoagulation Instructions: INR 2.3 Continue 2mg s everyday. Recheck in 4 weeks.   Current Anticoagulation Instructions: Continue 2mg  daily.

## 2010-11-12 NOTE — Medication Information (Signed)
Summary: rov/cb  Anticoagulant Therapy  Managed by: Weston Brass, PharmD Referring MD: Charlton Haws MD PCP: Gordy Savers  MD Supervising MD: Daleen Squibb MD, Maisie Fus Indication 1: Atrial Fibrillation (ICD-427.31) Lab Used: [facility] Site: [facility] INR POC 2.4 INR RANGE 2 - 3  Dietary changes: no    Health status changes: no    Bleeding/hemorrhagic complications: no    Recent/future hospitalizations: no    Any changes in medication regimen? no    Recent/future dental: no  Any missed doses?: no       Is patient compliant with meds? yes       Allergies: 1)  ! Pcn 2)  ! Sulfa 3)  ! Nsaids 4)  ! Codeine  Anticoagulation Management History:      The patient is taking warfarin and comes in today for a routine follow up visit.  Positive risk factors for bleeding include an age of 1 years or older.  The bleeding index is 'intermediate risk'.  Positive CHADS2 values include History of HTN and Age > 35 years old.  The start date was 10/11/2002.  Anticoagulation responsible provider: Daleen Squibb MD, Maisie Fus.  INR POC: 2.4.  Cuvette Lot#: [id number].  Exp: 07/2011.    Anticoagulation Management Assessment/Plan:      The patient's current anticoagulation dose is Coumadin 2 mg tabs: Take as directed by coumadin clinic..  The target INR is 2.0-3.0.  The next INR is due 06/03/2010.  Anticoagulation instructions were given to patient.  Results were reviewed/authorized by Weston Brass, PharmD.  She was notified by Weston Brass PharmD.         Prior Anticoagulation Instructions: INR 2.6. Take 1 tablet daily. Recheck in 4 weeks.  Current Anticoagulation Instructions: INR 2.4  Continue same dose of 2mg  daily.  Recheck INR in 4 weeks.

## 2010-11-12 NOTE — Assessment & Plan Note (Signed)
Summary: 6 mo f/u ./cy    Primary Provider:  Gordy Savers  MD  CC:  no complaints.  History of Present Illness: Bonnie Roberts seen today in followup for chronic atrial fibrillation, hypertension, edema and anticoagulation with Coumadin.  He is doing well for her age.  She continues to care for her handicapped daughter.  She has some sort of cognitive dysfunction.  Last visit she felt  that her heart rate was  a bit slow at times.  She recounts having heart rates in the high 40s to low 50s. We cut  back to 120 mg a day on her cardizem.  She has not had any presyncope or syncope.  She gets anticoagulation check at our Coumadin clinic and she has been therapeutic without any bleeding diathesis.  She is on low-dose Lasix with resolution of her lower extremity edema.  Her blood pressure is well controlled on her current medications.  She has no history of LV dysfunction or coronary artery disease.  Current Problems (verified): 1)  Venous Insufficiency, Chronic  (ICD-459.81) 2)  Anxiety Depression  (ICD-300.4) 3)  Coumadin Therapy  (ICD-V58.61) 4)  Atrial Fibrillation  (ICD-427.31) 5)  Hypertension  (ICD-401.9) 6)  Edema  (ICD-782.3) 7)  Dyspnea  (ICD-786.05) 8)  Hemorrhoid, External, Thrombosed  (ICD-455.4) 9)  Disorder, Menopausal Nos  (ICD-627.9) 10)  Osteoarthritis  (ICD-715.90) 11)  Degenerative Joint Disease  (ICD-715.90) 12)  Colonic Polyps, Hx of  (ICD-V12.72)  Current Medications (verified): 1)  Furosemide 40 Mg Tabs (Furosemide) .Marland Kitchen.. 1 Q Am, 1/2 At Bedtime 2)  Metoprolol Succinate 50 Mg Tb24 (Metoprolol Succinate) .... 1/2 Once Daily 3)  Potassium Chloride Crys Cr 20 Meq Tbcr (Potassium Chloride Crys Cr) .Marland Kitchen.. 1 Q Am, 1/2 At Bedtime 4)  Coumadin 2 Mg Tabs (Warfarin Sodium) .... Take As Directed By Coumadin Clinic. 5)  Stool Softner .Marland Kitchen.. 1 Tab Two Times A Day 6)  Fish Oil 1000 Mg Caps (Omega-3 Fatty Acids) .Marland Kitchen.. 1 Cap Two Times A Day 7)  Flax Seed Oil 1000 Mg Caps (Flaxseed  (Linseed)) .Marland Kitchen.. 1 Cap Once Daily 8)  Cardizem Cd 120 Mg Xr24h-Cap (Diltiazem Hcl Coated Beads) .... Once Daily 9)  Clobetasol Propionate 0.05 % Crea (Clobetasol Propionate) .... Use Daily As Directed 10)  Triamcinolone Acetonide 0.025 % Crea (Triamcinolone Acetonide) .... Use After Shower As Directed 11)  Centrum Silver  Tabs (Multiple Vitamins-Minerals) .Marland Kitchen.. 1 Tab By Mouth Once Daily 12)  Lorazepam 0.5 Mg Tabs (Lorazepam) .Marland Kitchen.. 1 By Mouth For Anxiety.Bonnie Roberts Will Call Back To Clarify Prescribed Dose and Instructions..mp 11/24/2009 1205 Pm  Allergies: 1)  ! Pcn 2)  ! Sulfa 3)  ! Nsaids 4)  ! Codeine  Past History:  Past Medical History: Last updated: 03/13/2009 ATRIAL FIBRILLATION (ICD-427.31) HYPERTENSION (ICD-401.9) EDEMA (ICD-782.3) DYSPNEA (ICD-786.05) HEMORRHOID, EXTERNAL, THROMBOSED (ICD-455.4) DISORDER, MENOPAUSAL NOS (ICD-627.9) OSTEOARTHRITIS (ICD-715.90) DEGENERATIVE JOINT DISEASE (ICD-715.90) COLONIC POLYPS, HX OF (ICD-V12.72)    Past Surgical History: Last updated: 04/10/2007 Carpal tunnel release-right Cholecystectomy-1967 Hysterectomy-1959 Total knee replacement-right-2003 Rectocele/bladder tacking-1996 hernia repair-left G 4 P 4 AB 0 Heart cath-2006 Colonoscopy-05/2004  Family History: Last updated: 05/15/2007 Family History Lung cancer Family History Other cancer-throat, colon Fam hx Cerebrovascular dx father died at 14, cerebrall vascular disease also had a history of throat cancer.  Mother died at 37 two brothers for lung cancer, colon cancer two sisters positive for lung cancer  Social History: Last updated: 03/14/2009 Former Smoker Alcohol use-no Widowed x2 Handicapped daughter lives with her 2 other living  sones  Review of Systems       Denies fever, malais, weight loss, blurry vision, decreased visual acuity, cough, sputum, SOB, hemoptysis, pleuritic pain, palpitaitons, heartburn, abdominal pain, melena, lower extremity edema,  claudication, or rash.   Vital Signs:  Patient profile:   77 year old female Height:      61 inches Weight:      175 pounds BMI:     33.19 Pulse rate:   85 / minute Pulse rhythm:   irregularly irregular Resp:     12 per minute BP sitting:   150 / 88  (left arm)  Vitals Entered By: Kem Parkinson (March 11, 2010 9:31 AM)  Physical Exam  General:  Affect appropriate Healthy:  appears stated age HEENT: normal Neck supple with no adenopathy JVP normal no bruits no thyromegaly Lungs clear with no wheezing and good diaphragmatic motion Heart:  S1/S2 no murmur,rub, gallop or click PMI normal Abdomen: benighn, BS positve, no tenderness, no AAA no bruit.  No HSM or HJR Distal pulses intact with no bruits Trace bilateral  edema Neuro non-focal Skin warm and dry    Impression & Recommendations:  Problem # 1:  VENOUS INSUFFICIENCY, CHRONIC (ICD-459.81) Stable continue current dose of lasix  Problem # 2:  ATRIAL FIBRILLATION (ICD-427.31) No bradycardic episodes since cardizem dose decreased Her updated medication list for this problem includes:    Metoprolol Succinate 50 Mg Tb24 (Metoprolol succinate) .Marland Kitchen... 1/2 once daily    Coumadin 2 Mg Tabs (Warfarin sodium) .Marland Kitchen... Take as directed by coumadin clinic.  Problem # 3:  HYPERTENSION (ICD-401.9) Well controlled Her updated medication list for this problem includes:    Furosemide 40 Mg Tabs (Furosemide) .Marland Kitchen... 1 q am, 1/2 at bedtime    Metoprolol Succinate 50 Mg Tb24 (Metoprolol succinate) .Marland Kitchen... 1/2 once daily    Cardizem Cd 120 Mg Xr24h-cap (Diltiazem hcl coated beads) ..... Once daily  Problem # 4:  COUMADIN THERAPY (ICD-V58.61) INR's Rx with no bleeding diathesis.  Continue F/U in clinic.  Dietary restrictions reiterated  Patient Instructions: 1)  Your physician recommends that you schedule a follow-up appointment in: ONE YEAR

## 2010-11-12 NOTE — Medication Information (Signed)
Summary: rov-tp  Anticoagulant Therapy  Managed by: Shelby Dubin, PharmD, BCPS, CPP Referring MD: Charlton Haws MD Supervising MD: Shirlee Latch MD, Freida Busman Indication 1: Atrial Fibrillation (ICD-427.31) Lab Used: [facility] Site: [facility] INR POC 3.3 INR RANGE 2 - 3  Dietary changes: no    Health status changes: no    Bleeding/hemorrhagic complications: no    Recent/future hospitalizations: no    Any changes in medication regimen? yes       Details: lorazepam for anxiety  Recent/future dental: no  Any missed doses?: no       Is patient compliant with meds? yes       Current Medications (verified): 1)  Furosemide 40 Mg Tabs (Furosemide) .Marland Kitchen.. 1 Q Am, 1/2 At Bedtime 2)  Metoprolol Succinate 50 Mg Tb24 (Metoprolol Succinate) .... 1/2 Once Daily 3)  Potassium Chloride Crys Cr 20 Meq Tbcr (Potassium Chloride Crys Cr) .Marland Kitchen.. 1 Q Am, 1/2 At Bedtime 4)  Coumadin 2 Mg Tabs (Warfarin Sodium) .... Take As Directed By Coumadin Clinic. 5)  Stool Softner .Marland Kitchen.. 1 Tab Two Times A Day 6)  Fish Oil 1000 Mg Caps (Omega-3 Fatty Acids) .Marland Kitchen.. 1 Cap Two Times A Day 7)  Flax Seed Oil 1000 Mg Caps (Flaxseed (Linseed)) .Marland Kitchen.. 1 Cap Once Daily 8)  Cardizem Cd 120 Mg Xr24h-Cap (Diltiazem Hcl Coated Beads) .... Once Daily 9)  Clobetasol Propionate 0.05 % Crea (Clobetasol Propionate) .... Use Daily As Directed 10)  Triamcinolone Acetonide 0.025 % Crea (Triamcinolone Acetonide) .... Use After Shower As Directed 11)  Centrum Silver  Tabs (Multiple Vitamins-Minerals) .Marland Kitchen.. 1 Tab By Mouth Once Daily 12)  Flax   Oil (Flaxseed (Linseed)) .Marland Kitchen.. 1 Tab By Mouth Once Daily 13)  Lorazepam 0.5 Mg Tabs (Lorazepam) .Marland Kitchen.. 1 By Mouth For Anxiety.Hajar Alemany Will Call Back To Clarify Prescribed Dose and Instructions..mp 11/24/2009 1205 Pm  Allergies (verified): 1)  ! Flagyl 2)  ! Pcn 3)  ! Sulfa 4)  ! Nsaids 5)  ! Codeine  Anticoagulation Management History:      The patient is taking warfarin and comes in today for a  routine follow up visit.  Positive risk factors for bleeding include an age of 49 years or older.  The bleeding index is 'intermediate risk'.  Positive CHADS2 values include History of HTN and Age > 55 years old.  The start date was 10/11/2002.  Anticoagulation responsible provider: Shirlee Latch MD, Kiarah Eckstein.  INR POC: 3.3.  Cuvette Lot#: [id number].  Exp: 01/2011.    Anticoagulation Management Assessment/Plan:      The patient's current anticoagulation dose is Coumadin 2 mg tabs: Take as directed by coumadin clinic..  The target INR is 2.0-3.0.  The next INR is due 12/22/2009.  Anticoagulation instructions were given to patient.  Results were reviewed/authorized by Shelby Dubin, PharmD, BCPS, CPP.  She was notified by Shelby Dubin PharmD, BCPS, CPP.         Prior Anticoagulation Instructions: Continue 2mg  daily.  Current Anticoagulation Instructions: INR 3.3  Skip 1 day then resume 1 tab daily.  Recheck in 3 - 4 weeks.

## 2010-11-12 NOTE — Progress Notes (Signed)
Summary: extra dose med last night of am med  Phone Note Call from Patient Call back at [facility] Phone [phone]   Caller: vm Summary of Call: Made a mistake with my medicine yesterday.  Took another dose morning med at night.  How to get back on schedule?   hEART DOCTOR HAD TOLD HER TO TAKE FUROSEMIDE & POTASIUM two times a day, so no mess up there, no mess up with warfarin.  But took extra Cardizem & metoprolol.  Normally take it in the am.  Should she leave it off today & get back on schedule tomorrow?  Feeling okay.  Rudy Jew, RN  August 10, 2010 10:25 AM  Initial call taken by: Rudy Jew, RN,  August 10, 2010 9:54 AM  Follow-up for Phone Call        yes, skip today and resume tomarrow Follow-up by: Gordy Savers  MD,  August 10, 2010 1:03 PM  Additional Follow-up for Phone Call Additional follow up Details #1::        Phone Call Completed Additional Follow-up by: Rudy Jew, RN,  August 10, 2010 2:28 PM    New/Updated Medications: FUROSEMIDE 40 MG TABS (FUROSEMIDE) 1 q am, 1/2 at bedtime One two times a day per heart doctor. POTASSIUM CHLORIDE CRYS CR 20 MEQ TBCR (POTASSIUM CHLORIDE CRYS CR) 1 q am, 1/2 at bedtime One two times a day per heart doctor

## 2010-11-12 NOTE — Medication Information (Signed)
Summary: [facility]v coumadin - [facility]      Allergies Added:  Anticoagulant Therapy  Managed by: Leota Sauers, PharmD, BCPS, CPP Referring MD: Charlton Haws MD PCP: Gordy Savers  MD Supervising MD: Antoine Poche MD, Fayrene Fearing Indication 1: Atrial Fibrillation (ICD-427.31) Lab Used: [facility] Site: [facility] INR POC 2.3 INR RANGE 2 - 3  Dietary changes: yes       Details: less greens  Health status changes: no    Bleeding/hemorrhagic complications: no    Recent/future hospitalizations: no    Any changes in medication regimen? yes       Details: change metoprolol to atenolol  Recent/future dental: no  Any missed doses?: no       Is patient compliant with meds? yes       Current Medications (verified): 1)  Furosemide 40 Mg Tabs (Furosemide) .Marland Kitchen.. 1 Tab Po Bid . 2)  Potassium Chloride Crys Cr 20 Meq Tbcr (Potassium Chloride Crys Cr) .Marland Kitchen.. 1 Tab By Mouth Two Times A Day 3)  Coumadin 2 Mg Tabs (Warfarin Sodium) .... Take As Directed By Coumadin Clinic. 4)  Stool Softner .Marland Kitchen.. 1 Tab Two Times A Day 5)  Fish Oil 1000 Mg Caps (Omega-3 Fatty Acids) .Marland Kitchen.. 1 Cap Two Times A Day 6)  Flax Seed Oil 1000 Mg Caps (Flaxseed (Linseed)) .Marland Kitchen.. 1 Cap Once Daily 7)  Cardizem Cd 120 Mg Xr24h-Cap (Diltiazem Hcl Coated Beads) .... Once Daily 8)  Centrum Silver  Tabs (Multiple Vitamins-Minerals) .Marland Kitchen.. 1 Tab By Mouth Once Daily 9)  Atenolol 25 Mg Tabs (Atenolol) .... One Daily  Allergies (verified): 1)  ! Pcn 2)  ! Sulfa 3)  ! Nsaids 4)  ! Codeine  Anticoagulation Management History:      The patient is taking warfarin and comes in today for a routine follow up visit.  Positive risk factors for bleeding include an age of 39 years or older.  The bleeding index is 'intermediate risk'.  Positive CHADS2 values include History of HTN and Age > 67 years old.  The start date was 10/11/2002.  Anticoagulation responsible provider: Antoine Poche MD, Fayrene Fearing.  INR POC: 2.3.  Cuvette Lot#: [id number].  Exp: 08/2011.     Anticoagulation Management Assessment/Plan:      The patient's current anticoagulation dose is Coumadin 2 mg tabs: Take as directed by coumadin clinic..  The target INR is 2.0-3.0.  The next INR is due 11/11/2010.  Anticoagulation instructions were given to patient.  Results were reviewed/authorized by Leota Sauers, PharmD, BCPS, CPP.         Prior Anticoagulation Instructions: INR 2.0  Coumadin 2mg  tabs  - take 1 tab each day except 1/2 tab on MON  Current Anticoagulation Instructions: INR 2.3  Coumadin 2mg  tabs - take 1 tab each day EXCEPT 1/2 tab on MON

## 2010-11-12 NOTE — Medication Information (Signed)
Summary: rov/jm   Anticoagulant Therapy  Managed by: Weston Brass, PharmD Referring MD: Charlton Haws MD PCP: Gordy Savers  MD Supervising MD: Daleen Squibb MD, Maisie Fus Indication 1: Atrial Fibrillation (ICD-427.31) Lab Used: [facility] Site: [facility] INR POC 2.6 INR RANGE 2 - 3  Dietary changes: no    Health status changes: no    Bleeding/hemorrhagic complications: no    Recent/future hospitalizations: yes       Details: went to hospital for SOB  Any changes in medication regimen? no    Recent/future dental: no  Any missed doses?: no       Is patient compliant with meds? yes       Allergies: 1)  ! Pcn 2)  ! Sulfa 3)  ! Nsaids 4)  ! Codeine  Anticoagulation Management History:      Positive risk factors for bleeding include an age of 43 years or older.  The bleeding index is 'intermediate risk'.  Positive CHADS2 values include History of HTN and Age > 29 years old.  The start date was 10/11/2002.  Anticoagulation responsible provider: Daleen Squibb MD, Maisie Fus.  INR POC: 2.6.  Exp: 08/2011.    Anticoagulation Management Assessment/Plan:      The patient's current anticoagulation dose is Coumadin 2 mg tabs: Take as directed by coumadin clinic..  The target INR is 2.0-3.0.  The next INR is due 08/19/2010.  Anticoagulation instructions were given to patient.  Results were reviewed/authorized by Weston Brass, PharmD.  She was notified by Ilean Skill D candidate.         Prior Anticoagulation Instructions: INR 3.3  Skip tomorrow's dose, then begin new dose of: one tablet every day except for one-half tablet on Monday.  Recheck in three weeks.    Current Anticoagulation Instructions: INR 2.6  Continue taking 1 tablet everyday except 1/2 tablet on Monday. Recheck in 4 weeks.

## 2010-11-12 NOTE — Medication Information (Signed)
Summary: rov/sl  Anticoagulant Therapy  Managed by: Leota Sauers, PharmD, BCPS, CPP Referring MD: Charlton Haws MD PCP: Gordy Savers  MD Supervising MD: Ladona Ridgel MD, Sharlot Gowda Indication 1: Atrial Fibrillation (ICD-427.31) Lab Used: [facility] Site: [facility] INR POC 2.0 INR RANGE 2 - 3  Dietary changes: yes       Details: not as many greens   Health status changes: no    Bleeding/hemorrhagic complications: no    Recent/future hospitalizations: no    Any changes in medication regimen? no    Recent/future dental: no  Any missed doses?: no       Is patient compliant with meds? yes       Current Medications (verified): 1)  Furosemide 40 Mg Tabs (Furosemide) .Marland Kitchen.. 1 Tab Po Bid . 2)  Metoprolol Succinate 50 Mg Tb24 (Metoprolol Succinate) .... 1/2 Once Daily 3)  Potassium Chloride Crys Cr 20 Meq Tbcr (Potassium Chloride Crys Cr) .Marland Kitchen.. 1 Tab By Mouth Two Times A Day 4)  Coumadin 2 Mg Tabs (Warfarin Sodium) .... Take As Directed By Coumadin Clinic. 5)  Stool Softner .Marland Kitchen.. 1 Tab Two Times A Day 6)  Fish Oil 1000 Mg Caps (Omega-3 Fatty Acids) .Marland Kitchen.. 1 Cap Two Times A Day 7)  Flax Seed Oil 1000 Mg Caps (Flaxseed (Linseed)) .Marland Kitchen.. 1 Cap Once Daily 8)  Cardizem Cd 120 Mg Xr24h-Cap (Diltiazem Hcl Coated Beads) .... Once Daily 9)  Centrum Silver  Tabs (Multiple Vitamins-Minerals) .Marland Kitchen.. 1 Tab By Mouth Once Daily  Allergies: 1)  ! Pcn 2)  ! Sulfa 3)  ! Nsaids 4)  ! Codeine  Anticoagulation Management History:      Positive risk factors for bleeding include an age of 38 years or older.  The bleeding index is 'intermediate risk'.  Positive CHADS2 values include History of HTN and Age > 52 years old.  The start date was 10/11/2002.  Anticoagulation responsible provider: Ladona Ridgel MD, Sharlot Gowda.  INR POC: 2.0.  Exp: 08/2011.    Anticoagulation Management Assessment/Plan:      The patient's current anticoagulation dose is Coumadin 2 mg tabs: Take as directed by coumadin clinic..  The target INR is  2.0-3.0.  The next INR is due 10/14/2010.  Anticoagulation instructions were given to patient.  Results were reviewed/authorized by Leota Sauers, PharmD, BCPS, CPP.         Prior Anticoagulation Instructions: INR 2.3  Continue taking Coumadin 1 tab (2 mg) on all days except Coumadin 0.5 tab (1 mg) on Mondays. Return to clinic in 4 weeks.   Current Anticoagulation Instructions: INR 2.0  Coumadin 2mg  tabs  - take 1 tab each day except 1/2 tab on MON

## 2010-11-12 NOTE — Medication Information (Signed)
Summary: rov/tm  Anticoagulant Therapy  Managed by: Leota Sauers, PharmD, BCPS, CPP Referring MD: Charlton Haws MD Supervising MD: Daleen Squibb MD, Maisie Fus Indication 1: Atrial Fibrillation (ICD-427.31) Lab Used: [facility] Site: [facility] INR POC 2.2 INR RANGE 2 - 3  Dietary changes: no    Health status changes: no    Bleeding/hemorrhagic complications: no    Recent/future hospitalizations: no    Any changes in medication regimen? no    Recent/future dental: no  Any missed doses?: no       Is patient compliant with meds? yes       Current Medications (verified): 1)  Furosemide 40 Mg Tabs (Furosemide) .Marland Kitchen.. 1 Q Am, 1/2 At Bedtime 2)  Metoprolol Succinate 50 Mg Tb24 (Metoprolol Succinate) .... 1/2 Once Daily 3)  Potassium Chloride Crys Cr 20 Meq Tbcr (Potassium Chloride Crys Cr) .Marland Kitchen.. 1 Q Am, 1/2 At Bedtime 4)  Coumadin 2 Mg Tabs (Warfarin Sodium) .... Take As Directed By Coumadin Clinic. 5)  Stool Softner .Marland Kitchen.. 1 Tab Two Times A Day 6)  Fish Oil 1000 Mg Caps (Omega-3 Fatty Acids) .Marland Kitchen.. 1 Cap Two Times A Day 7)  Flax Seed Oil 1000 Mg Caps (Flaxseed (Linseed)) .Marland Kitchen.. 1 Cap Once Daily 8)  Cardizem Cd 120 Mg Xr24h-Cap (Diltiazem Hcl Coated Beads) .... Once Daily 9)  Clobetasol Propionate 0.05 % Crea (Clobetasol Propionate) .... Use Daily As Directed 10)  Triamcinolone Acetonide 0.025 % Crea (Triamcinolone Acetonide) .... Use After Shower As Directed 11)  Centrum Silver  Tabs (Multiple Vitamins-Minerals) .Marland Kitchen.. 1 Tab By Mouth Once Daily 12)  Flax   Oil (Flaxseed (Linseed)) .Marland Kitchen.. 1 Tab By Mouth Once Daily 13)  Lorazepam 0.5 Mg Tabs (Lorazepam) .Marland Kitchen.. 1 By Mouth For Anxiety.Eldoris Capetillo Will Call Back To Clarify Prescribed Dose and Instructions..mp 11/24/2009 1205 Pm 14)  Alprazolam 0.25 Mg Tabs (Alprazolam) .... Two Times A Day As Needed Anxiety or Sleep 15)  B Complex Vitamins  Caps (B Complex Vitamins) .... Take 1 Daily  Allergies: 1)  ! Pcn 2)  ! Sulfa 3)  ! Nsaids 4)  !  Codeine  Anticoagulation Management History:      The patient is taking warfarin and comes in today for a routine follow up visit.  Positive risk factors for bleeding include an age of 75 years or older.  The bleeding index is 'intermediate risk'.  Positive CHADS2 values include History of HTN and Age > 41 years old.  The start date was 10/11/2002.  Anticoagulation responsible provider: Daleen Squibb MD, Maisie Fus.  INR POC: 2.2.  Cuvette Lot#: [id number].  Exp: 02/2011.    Anticoagulation Management Assessment/Plan:      The patient's current anticoagulation dose is Coumadin 2 mg tabs: Take as directed by coumadin clinic..  The target INR is 2.0-3.0.  The next INR is due 03/10/2010.  Anticoagulation instructions were given to patient.  Results were reviewed/authorized by Leota Sauers, PharmD, BCPS, CPP.         Prior Anticoagulation Instructions: INR 2.4 Continue 2mg s everyday. Recheck in 4 weeks.   Current Anticoagulation Instructions: INR 2.2  Coumadin 2mg  = 1 tab each day

## 2010-11-18 NOTE — Medication Information (Signed)
Summary: rov/kh   Anticoagulant Therapy  Managed by: Tammy Sours, PharmD Referring MD: Charlton Haws MD PCP: Gordy Savers  MD Supervising MD: Ladona Ridgel MD, Sharlot Gowda Indication 1: Atrial Fibrillation (ICD-427.31) Lab Used[facility]  Site: [facility] INR POC 2 INR RANGE 2 - 3  Dietary changes: yes       Details: Pt. states that she did have some turnip greens on Monday but trys to keep it consistent.  Health status changes: no    Bleeding/hemorrhagic complications: no    Recent/future hospitalizations: no    Any changes in medication regimen? yes       Details: pt. states she started atenolol  Recent/future dental: no  Any missed doses?: no       Is patient compliant with meds? yes       Allergies: 1)  ! Pcn 2)  ! Sulfa 3)  ! Nsaids 4)  ! Codeine  Anticoagulation Management History:      The patient is taking warfarin and comes in today for a routine follow up visit.  Positive risk factors for bleeding include an age of 75 years or older.  The bleeding index is 'intermediate risk'.  Positive CHADS2 values include History of HTN and Age > 39 years old.  The start date was 10/11/2002.  Anticoagulation responsible provider: Ladona Ridgel MD, Sharlot Gowda.  INR POC: 2.  Cuvette Lot#: [id number].  Exp: 08/2011.    Anticoagulation Management Assessment/Plan:      The patient's current anticoagulation dose is Coumadin 2 mg tabs: Take as directed by coumadin clinic..  The target INR is 2.0-3.0.  The next INR is due 12/10/2010.  Anticoagulation instructions were given to patient.  Results were reviewed/authorized by Tammy Sours, PharmD.         Prior Anticoagulation Instructions: INR 2.3  Coumadin 2mg  tabs - take 1 tab each day EXCEPT 1/2 tab on MON  Current Anticoagulation Instructions: INR 2  Continue taking 1 tablet everday except 1/2 tablet on Mondays. Recheck INR in 4 weeks.

## 2010-11-30 DIAGNOSIS — I4891 Unspecified atrial fibrillation: Secondary | ICD-10-CM

## 2010-11-30 DIAGNOSIS — Z7901 Long term (current) use of anticoagulants: Secondary | ICD-10-CM | POA: Insufficient documentation

## 2010-12-10 ENCOUNTER — Encounter: Payer: Self-pay | Admitting: Internal Medicine

## 2010-12-10 ENCOUNTER — Encounter (INDEPENDENT_AMBULATORY_CARE_PROVIDER_SITE_OTHER): Payer: Medicare Other

## 2010-12-10 DIAGNOSIS — I4891 Unspecified atrial fibrillation: Secondary | ICD-10-CM

## 2010-12-10 DIAGNOSIS — Z7901 Long term (current) use of anticoagulants: Secondary | ICD-10-CM

## 2010-12-10 LAB — CONVERTED CEMR LAB: POC INR: 2.2

## 2010-12-17 NOTE — Medication Information (Signed)
Summary: rov/sp  Anticoagulant Therapy  Managed by: Windell Hummingbird, RN Referring MD: Charlton Haws MD PCP: Gordy Savers  MD Supervising MD: Tenny Craw MD, Gunnar Fusi Indication 1: Atrial Fibrillation (ICD-427.31) Lab Used: [facility]  Site: [facility] INR POC 2.2 INR RANGE 2 - 3  Dietary changes: no    Health status changes: no    Bleeding/hemorrhagic complications: no    Recent/future hospitalizations: no    Any changes in medication regimen? no    Recent/future dental: no  Any missed doses?: no       Is patient compliant with meds? yes       Allergies: 1)  ! Pcn 2)  ! Sulfa 3)  ! Nsaids 4)  ! Codeine  Anticoagulation Management History:      The patient is taking warfarin and comes in today for a routine follow up visit.  Positive risk factors for bleeding include an age of 45 years or older.  The bleeding index is 'intermediate risk'.  Positive CHADS2 values include History of HTN and Age > 70 years old.  The start date was 10/11/2002.  Anticoagulation responsible provider: Tenny Craw MD, Gunnar Fusi.  INR POC: 2.2.  Cuvette Lot#: [id number].  Exp: 10/2011.    Anticoagulation Management Assessment/Plan:      The patient's current anticoagulation dose is Coumadin 2 mg tabs: Take as directed by coumadin clinic..  The target INR is 2.0-3.0.  The next INR is due 01/07/2011.  Anticoagulation instructions were given to patient.  Results were reviewed/authorized by Windell Hummingbird, RN.  She was notified by Windell Hummingbird, RN.         Prior Anticoagulation Instructions: INR 2  Continue taking 1 tablet everday except 1/2 tablet on Mondays. Recheck INR in 4 weeks.   Current Anticoagulation Instructions: INR 2.2 Continue taking 1 tablet every day, except take 1/2 tablet on Mondays. Recheck in 4 weeks.

## 2010-12-23 LAB — POCT I-STAT, CHEM 8
BUN: 27 mg/dL — ABNORMAL HIGH (ref 6–23)
Chloride: 104 mEq/L (ref 96–112)
Sodium: 142 mEq/L (ref 135–145)
TCO2: 31 mmol/L (ref 0–100)

## 2010-12-23 LAB — POCT CARDIAC MARKERS: Myoglobin, poc: 47.6 ng/mL (ref 12–200)

## 2010-12-23 LAB — CBC
HCT: 46.4 % — ABNORMAL HIGH (ref 36.0–46.0)
Hemoglobin: 13.6 g/dL (ref 12.0–15.0)
Hemoglobin: 16.3 g/dL — ABNORMAL HIGH (ref 12.0–15.0)
MCH: 33.1 pg (ref 26.0–34.0)
Platelets: 198 10*3/uL (ref 150–400)
RBC: 4.32 MIL/uL (ref 3.87–5.11)
RBC: 4.93 MIL/uL (ref 3.87–5.11)
WBC: 6.7 10*3/uL (ref 4.0–10.5)

## 2010-12-23 LAB — COMPREHENSIVE METABOLIC PANEL
ALT: 15 U/L (ref 0–35)
AST: 26 U/L (ref 0–37)
Albumin: 3.2 g/dL — ABNORMAL LOW (ref 3.5–5.2)
Alkaline Phosphatase: 68 U/L (ref 39–117)
CO2: 32 mEq/L (ref 19–32)
Chloride: 102 mEq/L (ref 96–112)
GFR calc Af Amer: 60 mL/min — ABNORMAL LOW (ref >60)
Potassium: 3.9 mEq/L (ref 3.5–5.1)
Total Bilirubin: 1.3 mg/dL — ABNORMAL HIGH (ref 0.3–1.2)

## 2010-12-23 LAB — LIPID PANEL
HDL: 51 mg/dL (ref >39)
LDL Cholesterol: 141 mg/dL — ABNORMAL HIGH (ref 0–99)
Total CHOL/HDL Ratio: 4 RATIO
Triglycerides: 62 mg/dL (ref <150)
VLDL: 12 mg/dL (ref 0–40)

## 2010-12-23 LAB — PROTIME-INR: INR: 2.13 — ABNORMAL HIGH (ref 0.00–1.49)

## 2010-12-23 LAB — DIFFERENTIAL
Eosinophils Absolute: 0.1 10*3/uL (ref 0.0–0.7)
Lymphs Abs: 1.5 10*3/uL (ref 0.7–4.0)
Monocytes Absolute: 0.6 10*3/uL (ref 0.1–1.0)
Monocytes Relative: 10 % (ref 3–12)
Neutro Abs: 3.6 10*3/uL (ref 1.7–7.7)
Neutrophils Relative %: 61 % (ref 43–77)

## 2010-12-23 LAB — CARDIAC PANEL(CRET KIN+CKTOT+MB+TROPI)
CK, MB: 1.7 ng/mL (ref 0.3–4.0)
Relative Index: INVALID (ref 0.0–2.5)
Total CK: 39 U/L (ref 7–177)

## 2010-12-23 LAB — TSH: TSH: 1.444 u[IU]/mL (ref 0.350–4.500)

## 2010-12-23 LAB — CK TOTAL AND CKMB (NOT AT ARMC): Relative Index: INVALID (ref 0.0–2.5)

## 2010-12-23 LAB — HEMOGLOBIN A1C: Mean Plasma Glucose: 126 mg/dL — ABNORMAL HIGH (ref <117)

## 2010-12-23 LAB — TROPONIN I: Troponin I: 0.02 ng/mL (ref 0.00–0.06)

## 2010-12-28 ENCOUNTER — Telehealth: Payer: Self-pay | Admitting: *Deleted

## 2010-12-28 ENCOUNTER — Encounter: Payer: Self-pay | Admitting: Internal Medicine

## 2010-12-28 ENCOUNTER — Ambulatory Visit (INDEPENDENT_AMBULATORY_CARE_PROVIDER_SITE_OTHER): Payer: Medicare Other | Admitting: Internal Medicine

## 2010-12-28 DIAGNOSIS — I10 Essential (primary) hypertension: Secondary | ICD-10-CM

## 2010-12-28 DIAGNOSIS — I4891 Unspecified atrial fibrillation: Secondary | ICD-10-CM

## 2010-12-28 DIAGNOSIS — R609 Edema, unspecified: Secondary | ICD-10-CM

## 2010-12-28 MED ORDER — DOXYCYCLINE HYCLATE 100 MG PO TABS: 100.0000 mg | ORAL_TABLET | Freq: Two times a day (BID) | ORAL | Status: AC

## 2010-12-28 NOTE — Telephone Encounter (Signed)
Could not accept the voice mail phone.

## 2010-12-28 NOTE — Progress Notes (Signed)
  Subjective:    Patient ID: Bonnie Roberts, female    DOB: 11-Nov-1921, 77 y.o.   MRN: [id number]  HPI 77 year old patient who presents with a 7 day history of cough fever at and general sense of unwellness. She has been expectorating yellow sputum but over the past 2 days has become greenish she has noted increasing chest congestion. She has been using OTC antitussives. 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 There has been some rare nausea and vomiting    Review of Systems  Constitutional: Negative.   HENT: Negative for hearing loss, congestion, sore throat, rhinorrhea, dental problem, sinus pressure and tinnitus.   Eyes: Negative for pain, discharge and visual disturbance.  Respiratory: Positive for cough. Negative for shortness of breath.   Cardiovascular: Negative for chest pain, palpitations and leg swelling.  Gastrointestinal: Negative for nausea, vomiting, abdominal pain, diarrhea, constipation, blood in stool and abdominal distention.  Genitourinary: Negative for dysuria, urgency, frequency, hematuria, flank pain, vaginal bleeding, vaginal discharge, difficulty urinating, vaginal pain and pelvic pain.  Musculoskeletal: Negative for joint swelling, arthralgias and gait problem.  Skin: Negative for rash.  Neurological: Negative for dizziness, syncope, speech difficulty, weakness, numbness and headaches.  Hematological: Negative for adenopathy.  Psychiatric/Behavioral: Negative for behavioral problems, dysphoric mood and agitation. The patient is not nervous/anxious.        Objective:   Physical Exam  Constitutional: She is oriented to person, place, and time. She appears well-developed and well-nourished.  HENT:  Head: Normocephalic.  Right Ear: External ear normal.  Left Ear: External ear normal.  Mouth/Throat: Oropharynx is clear and moist.  Eyes: Conjunctivae and EOM are normal. Pupils are equal, round, and reactive to light.  Neck: Normal range  of motion. Neck supple. No thyromegaly present.  Cardiovascular: Normal rate, regular rhythm, normal heart sounds and intact distal pulses.   Pulmonary/Chest: Effort normal and breath sounds normal. No respiratory distress. She has no wheezes. She has no rales.        Scattered coarse rhonchi. Oxygen saturation normal  Abdominal: Soft. Bowel sounds are normal. She exhibits no mass. There is no tenderness.  Musculoskeletal: Normal range of motion.  Lymphadenopathy:    She has no cervical adenopathy.  Neurological: She is alert and oriented to person, place, and time.  Skin: Skin is warm and dry. No rash noted.  Psychiatric: She has a normal mood and affect. Her behavior is normal.          Assessment & Plan:   acute bronchitis. Will continue expectorants to force fluid and treat with doxycycline. While she is on doxycycline we'll decrease her Coumadin dosing to 1 mg daily for 7 days . Chronic  atrial fibrillation  hypertension stable

## 2010-12-28 NOTE — Patient Instructions (Signed)
Coumadin take 1 mg daily only on until you complete antibiotic Get plenty of rest, Drink lots of  clear liquids, and use Tylenol  for fever and discomfort.   Take your antibiotic as prescribed until ALL of it is gone, but stop if you develop a rash, swelling, or any side effects of the medication.  Contact our office as soon as possible if  there are side effects of the medication.  Call or return to clinic prn if these symptoms worsen or fail to improve as anticipated.

## 2011-01-07 ENCOUNTER — Ambulatory Visit (INDEPENDENT_AMBULATORY_CARE_PROVIDER_SITE_OTHER): Payer: Medicare Other | Admitting: *Deleted

## 2011-01-07 DIAGNOSIS — I4891 Unspecified atrial fibrillation: Secondary | ICD-10-CM

## 2011-01-07 DIAGNOSIS — Z7901 Long term (current) use of anticoagulants: Secondary | ICD-10-CM

## 2011-01-07 NOTE — Patient Instructions (Signed)
INR 2.2 Cont taking 1/2 tab (1mg ) on Monday, and 1 tab (2mg ) the rest of the days Return to clinic in 4 weeks

## 2011-01-20 ENCOUNTER — Ambulatory Visit (INDEPENDENT_AMBULATORY_CARE_PROVIDER_SITE_OTHER): Payer: Medicare Other | Admitting: Internal Medicine

## 2011-01-20 ENCOUNTER — Encounter: Payer: Self-pay | Admitting: Internal Medicine

## 2011-01-20 DIAGNOSIS — I4891 Unspecified atrial fibrillation: Secondary | ICD-10-CM

## 2011-01-20 DIAGNOSIS — I10 Essential (primary) hypertension: Secondary | ICD-10-CM

## 2011-01-20 NOTE — Patient Instructions (Signed)
Limit your sodium (Salt) intake  Monthly prothrombin time  Return in 4 months for follow-up

## 2011-01-20 NOTE — Progress Notes (Signed)
  Subjective:    Patient ID: Bonnie Roberts, female    DOB: 10-02-1922, 77 y.o.   MRN: [id number]  HPI  77 year old patient who is seen today for followup of her chronic atrial fibrillation. She is on anticoagulation as well as rate control medication. She is doing quite well no cardiopulmonary complaints she has treated hypertension which has been stable. Denies any exertional chest pain.    Review of Systems  Constitutional: Negative.   HENT: Negative for hearing loss, congestion, sore throat, rhinorrhea, dental problem, sinus pressure and tinnitus.   Eyes: Negative for pain, discharge and visual disturbance.  Respiratory: Negative for cough and shortness of breath.   Cardiovascular: Negative for chest pain, palpitations and leg swelling.  Gastrointestinal: Negative for nausea, vomiting, abdominal pain, diarrhea, constipation, blood in stool and abdominal distention.  Genitourinary: Negative for dysuria, urgency, frequency, hematuria, flank pain, vaginal bleeding, vaginal discharge, difficulty urinating, vaginal pain and pelvic pain.  Musculoskeletal: Negative for joint swelling, arthralgias and gait problem.  Skin: Negative for rash.  Neurological: Negative for dizziness, syncope, speech difficulty, weakness, numbness and headaches.  Hematological: Negative for adenopathy.  Psychiatric/Behavioral: Negative for behavioral problems, dysphoric mood and agitation. The patient is not nervous/anxious.        Objective:   Physical Exam  Constitutional: She is oriented to person, place, and time. She appears well-developed and well-nourished.  HENT:  Head: Normocephalic.  Right Ear: External ear normal.  Left Ear: External ear normal.  Mouth/Throat: Oropharynx is clear and moist.  Eyes: Conjunctivae and EOM are normal. Pupils are equal, round, and reactive to light.  Neck: Normal range of motion. Neck supple. No thyromegaly present.  Cardiovascular: Normal rate, normal heart sounds and  intact distal pulses.        Controlled ventricular response  Pulmonary/Chest: Effort normal and breath sounds normal.  Abdominal: Soft. Bowel sounds are normal. She exhibits no mass. There is no tenderness.  Musculoskeletal: Normal range of motion. She exhibits edema.       Chronic stable left ankle edema  Lymphadenopathy:    She has no cervical adenopathy.  Neurological: She is alert and oriented to person, place, and time.  Skin: Skin is warm and dry. No rash noted.  Psychiatric: She has a normal mood and affect. Her behavior is normal.          Assessment & Plan:     Chronic atrial fibrillation. We'll continue rate control and anticoagulation therapy Hypertension stable.

## 2011-01-30 ENCOUNTER — Emergency Department (HOSPITAL_COMMUNITY)
Admission: EM | Admit: 2011-01-30 | Discharge: 2011-01-30 | Disposition: A | Discharge status: Home / Self Care | Payer: Medicare Other | Attending: Emergency Medicine | Admitting: Emergency Medicine

## 2011-01-30 DIAGNOSIS — N289 Disorder of kidney and ureter, unspecified: Secondary | ICD-10-CM | POA: Insufficient documentation

## 2011-01-30 DIAGNOSIS — N390 Urinary tract infection, site not specified: Secondary | ICD-10-CM | POA: Insufficient documentation

## 2011-01-30 DIAGNOSIS — I4891 Unspecified atrial fibrillation: Secondary | ICD-10-CM | POA: Insufficient documentation

## 2011-01-30 DIAGNOSIS — R6883 Chills (without fever): Secondary | ICD-10-CM | POA: Insufficient documentation

## 2011-01-30 DIAGNOSIS — Z79899 Other long term (current) drug therapy: Secondary | ICD-10-CM | POA: Insufficient documentation

## 2011-01-30 DIAGNOSIS — Z7901 Long term (current) use of anticoagulants: Secondary | ICD-10-CM | POA: Insufficient documentation

## 2011-01-30 DIAGNOSIS — I10 Essential (primary) hypertension: Secondary | ICD-10-CM | POA: Insufficient documentation

## 2011-01-30 DIAGNOSIS — R300 Dysuria: Secondary | ICD-10-CM | POA: Insufficient documentation

## 2011-01-30 LAB — URINE MICROSCOPIC-ADD ON

## 2011-01-30 LAB — URINALYSIS, ROUTINE W REFLEX MICROSCOPIC
Glucose, UA: NEGATIVE mg/dL
Specific Gravity, Urine: 1.013 (ref 1.005–1.030)
Urobilinogen, UA: 1 mg/dL (ref 0.0–1.0)
pH: 6 (ref 5.0–8.0)

## 2011-01-30 LAB — POCT I-STAT, CHEM 8
BUN: 20 mg/dL (ref 6–23)
Calcium, Ion: 0.98 mmol/L — ABNORMAL LOW (ref 1.12–1.32)
Chloride: 104 meq/L (ref 96–112)
Creatinine, Ser: 1.4 mg/dL — ABNORMAL HIGH (ref 0.4–1.2)
Glucose, Bld: 126 mg/dL — ABNORMAL HIGH (ref 70–99)
HCT: 41 % (ref 36.0–46.0)
Hemoglobin: 13.9 g/dL (ref 12.0–15.0)
Potassium: 3.9 meq/L (ref 3.5–5.1)
Sodium: 138 meq/L (ref 135–145)
TCO2: 27 mmol/L (ref 0–100)

## 2011-02-03 ENCOUNTER — Telehealth: Payer: Self-pay | Admitting: *Deleted

## 2011-02-03 LAB — URINE CULTURE

## 2011-02-03 NOTE — Telephone Encounter (Signed)
Pt. Was seen in the ER Saturday with a UTI and given antibiotics, and appt with Dr. Kirtland Bouchard tomorrow.  Pt is concerned because she is having some night sweats, but fever has broken, fever is down and no dysuria.  She will keep appt tomorrow unless she spikes fever or feels worse, and will call back to see someone else today.

## 2011-02-03 NOTE — Telephone Encounter (Signed)
agree

## 2011-02-04 ENCOUNTER — Encounter: Payer: Self-pay | Admitting: Internal Medicine

## 2011-02-04 ENCOUNTER — Ambulatory Visit: Payer: Medicare Other

## 2011-02-04 ENCOUNTER — Ambulatory Visit (INDEPENDENT_AMBULATORY_CARE_PROVIDER_SITE_OTHER): Payer: Medicare Other | Admitting: Internal Medicine

## 2011-02-04 ENCOUNTER — Encounter: Payer: Medicare Other | Admitting: *Deleted

## 2011-02-04 DIAGNOSIS — N289 Disorder of kidney and ureter, unspecified: Secondary | ICD-10-CM

## 2011-02-04 DIAGNOSIS — I4891 Unspecified atrial fibrillation: Secondary | ICD-10-CM

## 2011-02-04 DIAGNOSIS — Z7901 Long term (current) use of anticoagulants: Secondary | ICD-10-CM

## 2011-02-04 DIAGNOSIS — N390 Urinary tract infection, site not specified: Secondary | ICD-10-CM

## 2011-02-04 NOTE — Patient Instructions (Addendum)
Antibiotic therapy was prescribed for the child due to specific medical indications.  Monthly  Protimes  Return in 6 months for follow-up

## 2011-02-04 NOTE — Progress Notes (Signed)
  Subjective:    Patient ID: Bonnie Roberts, female    DOB: September 24, 1922, 77 y.o.   MRN: [id number]  HPI 77 year old patient who is on chronic anticoagulation due to chronic atrial fibrillation. She was evaluated in the emergency room approximately 5 days ago and treated for a UTI associated with fever and chills. She was noted to have minor renal insufficiency at that time. This was felt related to her acute illness and prerenal factors. She feels much improved today and has had no recurrent fever or chills she is completing antibiotic therapy. INR was checked today and was nicely therapeutic she was placed on cephalexin    Review of Systems  Constitutional: Positive for fatigue. Negative for fever and chills.  HENT: Negative for hearing loss, congestion, sore throat, rhinorrhea, dental problem, sinus pressure and tinnitus.   Eyes: Negative for pain, discharge and visual disturbance.  Respiratory: Negative for cough and shortness of breath.   Cardiovascular: Negative for chest pain, palpitations and leg swelling.  Gastrointestinal: Negative for nausea, vomiting, abdominal pain, diarrhea, constipation, blood in stool and abdominal distention.  Genitourinary: Negative for dysuria, urgency, frequency, hematuria, flank pain, vaginal bleeding, vaginal discharge, difficulty urinating, vaginal pain and pelvic pain.  Musculoskeletal: Negative for joint swelling, arthralgias and gait problem.  Skin: Negative for rash.  Neurological: Negative for dizziness, syncope, speech difficulty, weakness, numbness and headaches.  Hematological: Negative for adenopathy.  Psychiatric/Behavioral: Negative for behavioral problems, dysphoric mood and agitation. The patient is not nervous/anxious.        Objective:   Physical Exam  Constitutional: She is oriented to person, place, and time. She appears well-developed and well-nourished.       No distress  HENT:  Head: Normocephalic.  Right Ear: External ear  normal.  Left Ear: External ear normal.  Mouth/Throat: Oropharynx is clear and moist.  Eyes: Conjunctivae and EOM are normal. Pupils are equal, round, and reactive to light.  Neck: Normal range of motion. Neck supple. No thyromegaly present.  Cardiovascular: Normal rate, normal heart sounds and intact distal pulses.        Irregular rhythm with a controlled ventricular response  Pulmonary/Chest: Effort normal and breath sounds normal.  Abdominal: Soft. Bowel sounds are normal. She exhibits no mass. There is no tenderness.       No flank tenderness or suprapubic tenderness  Musculoskeletal: Normal range of motion.  Lymphadenopathy:    She has no cervical adenopathy.  Neurological: She is alert and oriented to person, place, and time.  Skin: Skin is warm and dry. No rash noted.  Psychiatric: She has a normal mood and affect. Her behavior is normal.          Assessment & Plan:  Status post UTI. Denies clinic response to antibiotic therapy Chronic atrial fibrillation Chronic anticoagulation History of mild azotemia. We'll recheck a bmet today.

## 2011-02-05 LAB — BASIC METABOLIC PANEL
CO2: 30 mEq/L (ref 19–32)
Calcium: 9.3 mg/dL (ref 8.4–10.5)
Creatinine, Ser: 1.1 mg/dL (ref 0.4–1.2)
Glucose, Bld: 109 mg/dL — ABNORMAL HIGH (ref 70–99)

## 2011-02-12 ENCOUNTER — Telehealth: Payer: Self-pay | Admitting: *Deleted

## 2011-02-12 MED ORDER — CIPROFLOXACIN HCL 500 MG PO TABS: 500.0000 mg | ORAL_TABLET | Freq: Two times a day (BID) | ORAL | Status: AC

## 2011-02-12 NOTE — Telephone Encounter (Signed)
ERROR

## 2011-02-12 NOTE — Telephone Encounter (Signed)
Pt is concerned because she has started with "night sweats" again.  She finished her last Keflex last Monday.  She is not having any UTI symptoms, but she started the last UTI with the same "night sweats".  [facility] [address].

## 2011-02-12 NOTE — Telephone Encounter (Signed)
Has this prescription being called in for the patient and the patient notified

## 2011-02-12 NOTE — Telephone Encounter (Signed)
Yes

## 2011-02-12 NOTE — Telephone Encounter (Signed)
Generic Cipro 500 mg #6 to take and twice daily for 3 days

## 2011-02-23 NOTE — Assessment & Plan Note (Signed)
[facility]                            CARDIOLOGY OFFICE NOTE   NAME:Shoun, JAHNAVI MURATORE                      MRN:          [id number]  DATE:03/18/2008                            DOB:          June 08, 1922    Bonnie Roberts returns today for followup.  She has chronic AFib.  She has been  seen in the Coumadin Clinic.  Her INRs were a little bit off when she  was on doxycycline.  Apparently, she saw an eye doctor and has dry eyes.  She thought that might clear up her tear ducts.  It did not work very  well.  She is otherwise doing well.  She has not had any significant  palpitations, PND, or orthopnea.  She has had increasing lower extremity  edema.  She has some venous insufficiency.  She needs to take more Lasix  and potassium.   Unfortunately, she has not cut out all of the salt in her diet.  She  needs to do this and elevate her legs at the end of the day.   She has otherwise hypertension with no known coronary artery disease.  Her blood pressures have been under good control.  She has had a  previous rectocele and hemorrhoids and may need surgery.  I told her she  should be fine for this.  We would need to stop her Coumadin 5 days  before and follow her in the hospital for rate control of her AFib.   CURRENT MEDS:  1. Toprol 25 a day.  2. Centrum Silver.  3. Lasix 40 a day, to be increased to 40 in the morning and 20 at      night.  4. Cardizem 240 a day.  5. Coumadin as directed.  6. Klor-Con, to be increased from 20 in the morning to 20 in the      morning and 10 at night.   ALLERGIES:  She is allergic to FLAGYL, PENICILLIN, CODEINE, SULFA, and  NONSTEROIDALS.   PHYSICAL EXAM:  Remarkable for an elderly white female in no distress.  Affect is appropriate.  Weight is 183, blood pressure is 140/70, pulse 71 and regular,  respiratory rate is 14, and afebrile.  HEENT:  Unremarkable.Carotids are normal without bruit.  No  lymphadenopathy, thyromegaly,  or JVP elevation.  LUNGS:  Clear.  Good diaphragmatic motion.  No wheezing.  CARDIAC:  S1 and S2.  Normal heart sounds.  PMI is normal.  ABDOMEN:  Benign.  Bowel sounds positive.  No AAA.  No tenderness.  No  hepatosplenomegaly. No hepatojugular reflux.  Distal pulses are intact, +1 edema on the right and +2 edema on the  left.  NEURO:  Nonfocal.  SKIN:  Warm and dry.  No muscular weakness.   IMPRESSION:  1. Lower extremity edema secondary to venous insufficiency and salt      intake.  Increase Lasix 40 in the morning 20 at night, increase      potassium 20 in the morning and 10 at night.  2. Hypertension.  Currently, well controlled.  Continue current dose  of diuretic, calcium blocker, and beta-blocker.  3. Atrial fibrillation, rate control fine.  Follow up in the Coumadin      Clinic.  Doxycycline is off now, so she should get better      regulated.  Stop Coumadin 5 days before any surgery.  4. Rectocele with possible need for surgery.  Follow up with Dr.      Earlene Plater.  She is cleared to have surgery.  We would like to follow      her in the hospital and be notified.  5. Anticoagulation.  Follow up to see the Coumadin Clinic today.      Reestablish INRs after being on doxycycline.  6. Dry eyes.  Follow up with ophthalmologist.     Bonnie Roberts. Eden Emms, MD, [facility]  Electronically Signed    PCN/MedQ  DD: 03/18/2008  DT: 03/19/2008  Job #: [id number]

## 2011-02-23 NOTE — Assessment & Plan Note (Signed)
[facility]                            CARDIOLOGY OFFICE NOTE   NAME:Bonnie Roberts, Bonnie Roberts                      MRN:          [id number]  DATE:09/18/2007                            DOB:          [birth year]    Bonnie Roberts is seen today as a new patient by me.  She has previously  been seen by Dr. Samule Ohm.  She has chronic atrial fibrillation on  Coumadin, she is to see the Coumadin clinic today.  She does get  occasional palpitations, I told her it would be fine to take an extra  1/2 Toprol.  In general she has not had any significant syncope, she  does not have coronary disease.  She had a cath in 2006 which was clean.  She occasionally gets atypical chest pain.   She has not had any bleeding diathesis, her Coumadin levels in general  have been good.  She does have some mild chronic lower extremity edema,  it is a little bit bad this morning but she tends not to take her  morning Lasix when she is out and about.  She drives herself everywhere  and has no bladder control.   REVIEW OF SYSTEMS:  Otherwise negative.   CURRENT MEDICATIONS:  1. Toprol 25 a day.  2. Centrum vitamins.  3. Lasix 40 a day.  4. Cardizem LA 240 a day.  5. Coumadin as directed.  6. Klor-Con 20 a day.   EXAMINATION:  Remarkable for healthy-appearing octogenarian, no  distress, affect is appropriate.  She is in atrial fibrillation at a  rate of 80, weight is 180, blood pressure is 140/80, pulse is 80 and  irregular.  HEENT:  Unremarkable.  Carotids normal without bruit, no lymphadenopathy  or thyromegaly or JVP elevation.  LUNGS:  Clear with diaphragmatic motion, no wheezing.  S1 and S2 with normal heart sounds.  PMI normal.  ABDOMEN:  Benign, bowel sounds positive, no bruit, no  hepatosplenomegaly, no hepatojugular reflux.  Pulses intact, no edema.  NEURO:  Nonfocal.  SKIN:  Warm and dry.   EKG shows atrial fibrillation with nonspecific ST-T wave changes.   IMPRESSION:  1.  Chronic atrial fibrillation, good rate control anticoagulation.      Followup in the Coumadin clinic.  Continue current doses of AV      nodal blocking drugs.  2. Hypertension, currently well-controlled.  Continue current dose of      beta blocker, Cardizem and Lasix.  3. Lower extremity edema, continue low-salt diet, elevated legs at the      end of the day.  Encouraged her to take an extra 1/2 of Lasix and      potassium at night for 2 or 3 days as needed.  Continue to follow      weight at home.   Overall, the patient is doing well, will see her back in 6 months.     Noralyn Pick. Eden Emms, MD, [facility]  Electronically Signed    PCN/MedQ  DD: 09/18/2007  DT: 09/18/2007  Job #: [id number]

## 2011-02-23 NOTE — Assessment & Plan Note (Signed)
[facility]                            CARDIOLOGY OFFICE NOTE   NAME:[name], [name]                      MRN:          [id number]  DATE:09/16/2008                            DOB:          1922-02-28    Bonnie Roberts returns today for followup.  She actually has a birthday coming  up his Christmas.  She is 85.  She is doing well.  She is still a good  candidate for Coumadin.  She is in chronic AFib.  Rate control has been  good.  Her INRs have been more therapeutic now that she is off  doxycycline.  She is not having significant palpitations, PND, or  orthopnea.  There have been no presyncopal spells.  She continues to  have trace lower extremity edema which is improved.   REVIEW OF SYSTEMS:  Remarkable for continued dry eyes.  She continues to  see the ophthalmologist   Current medications include:  1. Toprol 25 a day.  2. Centrum.  3. Lasix 40 a day.  4. Cardizem 240 a day.  5. Coumadin as directed.  6. Klor-Con 20 a day.  7. Fish oil.   PHYSICAL EXAMINATION:  GENERAL:  Remarkable for an elderly female in no  distress.  VITAL SIGNS:  She is in AFib at a rate of 81, blood pressure 140/77,  weight 179.  HEENT:  Unremarkable.  NECK:  Carotids normal without bruit.  No lymphadenopathy, thyromegaly,  JVP elevation.  LUNGS:  Clear.  Good diaphragmatic motion.  No wheezing.  S1 and S2 with  soft systolic murmur.  PMI normal.  ABDOMEN:  Benign.  Bowel sounds positive.  No AAA.  No tenderness.  No  bruit.  No hepatosplenomegaly, hepatojugular reflux, or tenderness.  EXTREMITIES:  Distal pulses are intact with +1 edema bilaterally.  Small  varicosities.  NEUROLOGIC:  Nonfocal.  SKIN:  Warm and dry.  MUSCULOSKELETAL:  No muscular weakness.   EKG shows AFib and nonspecific ST-T wave changes.   IMPRESSION:  1. Chronic atrial fibrillation, good anticoagulation and rate control.      Continue current medications.  2. Lower extremity edema, stable.   Continue a low-sodium diet.      Continue current dose of Lasix and potassium.  3. dry eyes.  Continue to follow up with ophthalmologist.  Currently      not on any antibiotics.   Overall, I think the patient's heart is stable.  Despite her advanced  age, she is a good candidate for Coumadin still.     Noralyn Pick. Eden Emms, MD, [facility]  Electronically Signed    PCN/MedQ  DD: 09/16/2008  DT: 09/17/2008  Job #: [id number]

## 2011-02-26 NOTE — Discharge Summary (Signed)
[place]  Patient:    Bonnie Roberts, Bonnie Roberts Visit Number: [id number] MRN: [id number]          Service Type: [facility] Location: [id number] Attending Physician:  Faith Rogue T Dictated by:   Mcarthur Rossetti. Angiulli, P.A. Admit Date:  02/15/2002 Discharge Date: 02/22/2002   CC:         John L. Dorothyann Gibbs, M.D.  Gordy Savers, M.D. [facility]   Discharge Summary  DISCHARGE DIAGNOSES: 1. Right total-knee arthroplasty Feb 12, 2002 secondary to degenerative joint    disease. 2. Postoperative anemia. 3. Hypertension. 4. Enterococcus urinary tract infection. 5. Hypertension. 6. Gastroesophageal reflux disease.  HISTORY OF PRESENT ILLNESS:  77 year old white female admitted on Feb 12, 2002 with end-stage degenerative joint disease of the right knee.  No relief with conservative care.  She underwent right total knee arthroplasty on Feb 12, 2002 per Dr. Priscille Kluver.  She was placed on Arixtra for deep vein thrombosis prophylaxis and weightbearing as tolerated.  Postoperative pain management.  HOSPITAL COURSE:  Uneventful.  Minimal assistance for ambulation.  Latest hemoglobin 10.4  Chemistries unremarkable.  Venous Doppler study Feb 14, 2002 was negative for deep vein thrombosis.  She was admitted for comprehensive rehabilitation program.  PAST MEDICAL HISTORY:  See discharge diagnoses.  His prior M.D. is Dr. Gordy Savers, M.D. [facility].  PAST SURGICAL HISTORY: 1. Right carpal tunnel release. 2. Bladder tacking. 3. Cataract surgery. 4. Hysterectomy.  MEDICATION PRIOR TO ADMISSION: 1. Hydrochlorothiazide. 2. Premarin. 3. Darvocet.  ALLERGIES:  CODEINE, PENICILLIN, SULFA AND NONSTEROIDALS.  SOCIAL HISTORY:  Patient lives in [place] with a mentally challenged daughter.  Independent prior to admission.  One-level home.  One step to entry. Son in [place], [place] currently caring for his sister.  HOSPITAL COURSE:  Patient did well on rehabilitation  services with therapies initiated on a b.i.d. basis.  The following issues were followed during patients rehabilitation course.  Pertaining to Ms. Hodgins right total knee arthroplasty on Feb 12, 2002, surgical site healing nicely.  No signs of infection.  Ambulating supervision with a walker.  Weightbearing as tolerated.  Follow up with Dr. Priscille Kluver.  Home health therapies had been arranged.  Arixtra had been completed for deep vein thrombosis prophylaxis.  Venous Doppler study of lower extremities on Feb 14, 2002 was negative.  Postoperative anemia.  Latest hemoglobin 11.4 and hematocrit 33.3.  There were no bleeding episodes. Blood pressures remain controlled on hydrochlorothiazide.  There was no headache or dizziness.  She was treated for an E-coli urinary tract infection with amoxicillin x seven days.  She denied any dysuria or hematuria.  Overall, for her functional mobility, she was supervision with all acts of mobility as well as activities of daily living.  She will be discharged to home with physical and occupational therapy.  Latest laboratories showed hemoglobin 11.4, hematocrit 33.3, sodium 140, potassium 3.1, BUN 8 and creatinine 0.8.  DISCHARGE MEDICATIONS: 1. Hydrochlorothiazide 25 mg daily. 2. Trinsicon twice daily. 3. Estrace Monday, Wednesday and Friday. 4. Protonix 40 mg daily. 5. Amoxicillin 250 mg three times daily to be completed Feb 24, 2002. 6. Tylox as needed for pain.  ACTIVITY:  Weightbearing as tolerated with walker.  DIET:  Regular.  WOUND CARE:  Cleanse incision daily with warm soap and water.  SPECIAL INSTRUCTIONS:  Home health physical and occupational therapy.  Follow up with Dr. Priscille Kluver of orthopedic services as advised and Dr. Eleonore Chiquito for medical management. Dictated by:  Mcarthur Rossetti. Angiulli, P.A. Attending Physician:  Faith Rogue T DD:  02/21/02 TD:  02/22/02 Job: [id number]

## 2011-02-26 NOTE — Discharge Summary (Signed)
[place]  Patient:    Bonnie Roberts, Bonnie Roberts Visit Number: [id number] MRN: [id number]          Service Type: [facility] Location: [id number] Attending Physician:  Faith Rogue T Dictated by:   Jamelle Rushing, P.A. Admit Date:  02/15/2002 Disc. Date: 02/15/02                             Discharge Summary  DATE OF BIRTH:  1922-07-12  ADMISSION DIAGNOSES: 1. End-stage right knee arthritis. 2. Hypertension. 3. Hiatal hernia with mild gastroesophageal reflux disease. 4. Eczema. 5. History of deep venous thrombosis.  DISCHARGE DIAGNOSES: 1. Right total knee arthroplasty. 2. Mild asymptomatic postoperative blood loss anemia. 3. Hypertension. 4. History of hiatal hernia with mild gastroesophageal reflux disease. 5. Eczema. 6. History of deep venous thrombosis.  HISTORY OF PRESENT ILLNESS:  This is a 77 year old, white female who presents with a five-year history of gradual onset and progressively worsening right knee pain.  It has worsened since December 2002.  There has been no injury or surgery.  The pain is described as an aching sensation of the joint with occasional radiation to the foot and is constant with any type of weightbearing.  It is worse first thing in the morning and improves as the knee pops.  She does have stiffness and gives way with popping and clicking. She has no night pain.  ALLERGIES:  CODEINE, PENICILLIN, SULFA, NSAIDS.  MEDICATIONS: 1. Hydrochlorothiazide 25 mg p.o. q.a.m. 2. Darvocet-N 100 one to two p.o. q.4h. p.r.n. 3. Premarin vaginal cream 0.625 mg inserted three times a week. 4. Ultravate 0.05% cream applied to rash q.d.  PROCEDURE:  On Feb 12, 2002, the patient was admitted to [facility] and taken to the OR by Dr. Erasmo Leventhal, assisted by Arnoldo Morale, P.A.-C. Under anesthesia, the patient had a right total knee arthroplasty with a low contract stress (LCS) fully cemented knee replacement.  The  patient tolerated this procedure well.  Operative time was 1 hour 5 minutes.  The patient was transferred to the recovery room in good condition.  Estimated blood loss was less than 100 cc.  HOSPITAL COURSE:  On Feb 12, 2002, the patient was admitted to [facility] under the care of Dr. Jonny Ruiz Rendall.  The patient was taken to the OR where a right total knee arthroplasty was performed.  The patient tolerated this procedure well and received a postoperative femoral nerve block for postoperative pain management.  The patient was started on Arixtra 2.5 mg subcu q.d. for routine DVT prophylaxis.  The patient spent a total of three days postoperative care on the orthopedic floor in which the only specific untoward event occurred was significant right calf discomfort.  She did have a positive Homans, but a venous Doppler evaluation revealed no evidence of a DVT, superficial thrombus or a Bakers cyst bilaterally.  The patients vital signs remained stable.  The patient did develop some mild postoperative blood loss anemia with her H&H dropping 10.4 and 29.9.  The patient remained asymptomatic with this.  The patients leg remained neurovascularly intact.  The wound remained benign for any signs of infection.  The patient initially was managed with PCA, but was transferred over to p.o. medications with fairly good control.  The patient was evaluated by rehabilitation services and was felt that this would be appropriate due to her progression with physical therapy and  her home situation.  She was accepted in transfer to the rehabilitation floor on postop day #3 in good condition.  LABORATORY DATA AND X-RAY FINDINGS:  CBC on May 8, with WBC 9.9, hemoglobin 10.4, hematocrit 29.9, platelets 184.  Routine chemistries on May 7, with sodium 138, potassium 3.6, chloride 97, CO2 34, BUN 4, creatinine 0.7, glucose 155.  Routine urinalysis on admission shows negative nitrites, large  leukocyte esterase, a few epithelial cells, a few bacteria.  The patient will be treated for a UTI.  EKG on admission was normal sinus rhythm with nonspecific ST abnormalities at 66 beats per minute.  There were no old tracings to compare.  On May 1, EKG was normal sinus rhythm with nonspecific T wave abnormalities at 66 beats per minute.  DISCHARGE MEDICATIONS:  1. Arixtra 2.5 mg subcu q.d. for a total of seven days.  2. Hydrochlorothiazide 25 mg p.o. q.d.  3. Colace 100 mg p.o. b.i.d.  4. Senokot 8.6 mg p.o. b.i.d. a.c.  5. Trinsicon one tablet p.o. t.i.d. PC.  6. Estrace 0.01% cream apply Monday, Wednesday and Friday.  7. Reglan 10 mg p.o. q.8h. p.r.n.  8. Tylenol 650 mg p.o. q.4h. p.r.n.  9. Robaxin 500 mg p.o. q.6h. p.r.n. 10. Restoril 15 mg p.o. q.h.s. p.r.n. 11. Milk of Magnesia 30 ml p.r.n. 12. Percocet one to two tablets every four to six hours p.r.n.  SPECIAL INSTRUCTIONS:  The patient is to continue medications as dispensed on orthopedic floor.  The patient was notified that if she had any increase in urinary tract infection signs such as burning pain, unable to control urine, she should notify the nursing staff and she will be retreated for urinary tract infection.  The patient did receive clindamycin routine postop.  ACTIVITY:  The patient may be weightbearing as tolerated with the use of a walker, no restrictions on right knee.  DIET:  No restrictions.  WOUND CARE:  The patient is to have wound check daily for any signs of infection.  Staples are to be removed on postop day #14.  FOLLOWUP:  The patient should have a postop followup with Dr. Priscille Kluver two weeks from the date of discharge from rehabilitation.  The patient is to call [phone], for a follow-up appointment.  CONDITION ON DISCHARGE:  Condition on discharge to rehabilitation floor is listed as improved and good. Dictated by:   Jamelle Rushing, P.A. Attending Physician:  Faith Rogue T DD:   02/15/02 TD:  02/19/02 Job: [id number]

## 2011-02-26 NOTE — Op Note (Signed)
[place]  Patient:    Bonnie Roberts, Bonnie Roberts Visit Number: [id number] MRN: [id number]          Service Type: SUR Location: [id number] Attending Physician:  Carolan Shiver Ii Dictated by:   Cliffton Asters Ivin Booty, M.D. Proc. Date: 02/12/02 Admit Date:  02/12/2002   CC:         Anesthesia Department   Operative Report  DESCRIPTION OF PROCEDURE:  The patient was brought to the operating room by Dr. Thad Ranger for a knee replacement. She was interviewed preoperatively by me, and I explained the risks and benefits of analgesia postoperatively using a femoral nerve block technique. She understood these and wished to have the procedure performed.  At the conclusion of the operation and while still under general anesthesia, the right groin was prepped in a sterile fashion. At a point just lateral to the right femoral pulse, a 22-gauge, 80-mm stimulator needle was passed at a milliamperage of 1.2 mA. The quadriceps muscles were seen to twitch, and the current was decreased to a level of 0.5 mA. There was no stimulation noted at 0.3 mA. There was a negative aspiration of blood and a total dose of 25 cc of 0.25% bupivacaine and 1% lidocaine with 1:400,000 epinephrine was given slowly over 3 minutes. The needle was removed. The patient was awakened, suctioned, and extubated in the operating room. She was taken to the PACU in good condition. Dictated by:   Cliffton Asters Ivin Booty, M.D. Attending Physician:  Carolan Shiver Ii DD:  02/12/02 TD:  02/14/02 Job: [id number]

## 2011-02-26 NOTE — Op Note (Signed)
[place]. [facility]  Patient:    Bonnie Roberts, Bonnie Roberts Visit Number: [id number] MRN: [id number]          Service Type: SUR Location: 5000 5040 01 Attending Physician:  Carolan Shiver Ii Dictated by:   Carlisle Beers. Dorothyann Gibbs, M.D. Proc. Date: 02/12/02 Admit Date:  02/12/2002                             Operative Report  PREOPERATIVE DIAGNOSIS:  Osteoarthritis, right knee.  POSTOPERATIVE DIAGNOSIS:  Osteoarthritis, right knee.  OPERATION PERFORMED:  LCS total knee replacement, fully cemented.  SURGEON:  John L. Dorothyann Gibbs, M.D.  ASSISTANT:  Arnoldo Morale, P.A.  ANESTHESIA:  General.  PATHOLOGY:  Bone-on-bone medial compartment and patellofemoral articulation, right knee.  DESCRIPTION OF PROCEDURE:  Under general anesthesia, the right leg was prepared with DuraPrep and draped as a sterile field.  A midline incision was made, the patella was everted. The femur was sized as a standard.  Proximal tibial resection was carried out.  The first femoral guide was used for an intercondylar drill hole and the flexion gap was balanced requiring elevation of a periosteal sleeve including the MCL.  It was balanced with a 15 mm spacer.  Using the first femoral guide then, the anterior and posterior flare of the distal femur were resected with a 15 mm flexion gap.  The extension gap was then cut using an intramedullary cutting guide and a 15 mm extension gap. Recessing guide was then used.  The lamina spreader was then inserted and remnants of the cruciates and menisci were resected.  The proximal tibia was then exposed.  I sized it a #3, center peg hole with fins was made. Trial reduction with size 3 tibia, 15 mm bearing and standard right femur revealed excellent fit, alignment and stability.  The patella was then osteotomized and three peg holes placed.  A trial component showed good fit and  stability. Bony surfaces were then prepared with pulse irrigation and  cement was mixed. Permanent components were all cemented in place.  Once it was hardened, excess was removed.  Tourniquet was then let down at approximately 45 minutes.  The wound was then closed in layers with #1 Surgidac, 0 and 2-0 Vicryl and skin clips.  Operative time approximately one hour and five minutes.  The patient tolerated the procedure well and returned to recovery in good condition. Dictated by:   Carlisle Beers. Dorothyann Gibbs, M.D. Attending Physician:  Carolan Shiver Ii DD:  02/12/02 TD:  02/13/02 Job: [id number]

## 2011-02-26 NOTE — Assessment & Plan Note (Signed)
[facility] OFFICE NOTE   NAME:Bonnie Roberts, Bonnie Roberts                      MRN:          [id number]  DATE:05/06/2006                            DOB:          [birth year]    HISTORY:  This 77 year old female seen today for a comprehensive  examination.  She has a history of hypertension and chronic atrial  fibrillation with colonic polyps.  She is doing quite well.   PAST MEDICAL/ SURGICAL HISTORY:  Additionally she has degenerative joint  disease.  She has had a number of surgeries over the years, including a rectocele and  bladder tacking in 1996, a remote hysterectomy in 1959, a cholecystectomy in  1967.  She has had a left hernia repair.  She also had a right carpal tunnel  release procedure, a vein stripping and surgery for a Morton's neuroma.  She  is a gravida 4, para 4, AB 0.  She has also had a right total knee  replacement surgery in 2003.  She did have a heart catheterization in 2006.  She has had atrial fibrillation since 2004.  Her last colonoscopy was in  August 2005.  This was free of polyps.   REVIEW OF SYSTEMS:  Negative.  She is doing quite well.   MEDICATIONS:  Chronic Coumadin.   FAMILY HISTORY:  Positive for cerebrovascular disease, colon and lung  cancer.   PHYSICAL EXAMINATION:  GENERAL:  An elderly white female, mildly overweight,  in no acute distress.  VITAL SIGNS:  Blood pressure 120/76.  HEENT:  Fundi, ears, nose, throat clear.  NECK:  No bruits.  CHEST:  Clear.  CARDIOVASCULAR:  Normal heart sounds.  Rhythm was irregular with controlled  ventricular response.  No murmurs.  BREASTS:  Negative.  ABDOMEN:  Multiple surgical scars.  No organomegaly.  PELVIC:  Revealed absent uterus.  No adnexal masses.  RECTAL:  Heme-negative.  Some external hemorrhoids noted.  EXTREMITIES:  Revealed dorsalis pedis pulses full.  Posterior tibial pulses  are not easily palpable.  Varicosities noted,  but no significant edema.  NEUROLOGIC:  Negative.   IMPRESSION:  1.  Chronic atrial fibrillation.  2.  Colonic polyps.  3.  Hypertension.  4.  Degenerative joint disease.   DISPOSITION:  Her medical regimen is unchanged.  The laboratory update will  be reviewed.  Will reassess here in four months.                                   Gordy Savers, MD   PFK/MedQ  DD:  05/06/2006  DT:  05/06/2006  Job #:  [id number]

## 2011-02-26 NOTE — Op Note (Signed)
[place]. [facility]  Patient:    Bonnie Roberts, Bonnie Roberts Visit Number: [id number] MRN: [id number]          Service Type: DSU Location: [facility] 2899 20 Attending Physician:  Ivor Messier Dictated by:   Guadelupe Sabin, M.D. Proc. Date: 10/24/01 Admit Date:  10/24/2001 Discharge Date: 10/24/2001                             Operative Report  PREOPERATIVE DIAGNOSIS:  Senile nuclear cataract, right eye.  POSTOPERATIVE DIAGNOSIS:  Senile nuclear cataract, right eye.  OPERATION:  Planned extracapsular cataract extraction -- phacoemulsification, primary insertion of posterior chamber intraocular lens implant.  SURGEON:  Guadelupe Sabin, M.D.  ASSISTANT:  Nurse.  ANESTHESIA:  Local 4% Xylocaine, 0.75% Marcaine retrobulbar block, topical tetracaine, intraocular Xylocaine.  Anesthesia standby required in this elderly patient, patient given Ditropan intravenously during the period of retrobulbar blocking.  DESCRIPTION OF PROCEDURE:  After the patient was prepped and draped, a lid speculum was inserted in the right eye.  The eye was turned downward and a superior rectus traction suture placed.  Schiotz tonometry was recorded at 5 scale units with a 5.5 g weight.  A peritomy was performed adjacent to the limbus from the 11 to 1 oclock position.  The corneoscleral junction was cleaned and a corneoscleral groove made with a 45 degree Superblade.  The anterior chamber was then entered with the 2.5-mm diamond keratome at the 12 oclock position and the 15 degree blade at the 2:30 position.  Using a bent 26-gauge needle on a Healon syringe, a circular capsulorhexis was begun and then completed with the Grabow forceps.  Hydrodissection and hydrodelineation were performed using 1% Xylocaine.  A 30 degree phacoemulsification tip was then inserted with slow controlled emulsification with posterior lens chopping.  Following removal of the nucleus, which had a total  ultrasonic time of 1 minute and 5 seconds, average power level 16% and 60 cc of fluid, the cortex was aspirated with the irrigation-aspiration tip. The posterior capsule appeared intact with a brilliant red fundus reflex.  It was therefore elected to insert an [employer] model SI40NB three-piece silicone posterior chamber intraocular lens implant with UV absorber, diopter strength +21.50.  This was inserted with the McDonald forceps into the anterior chamber and then centered into the capsular bag using the [facility] lens rotator.  The lens appeared to be well-centered.  The Healon which had been used during the procedure was then aspirated and replaced with balanced salt solution and Miochol ophthalmic solution.  The operative incisions appeared to be self-sealing and no sutures were required. Maxitrol ointment was instilled in the conjunctival cul-de-sac.  A light patch and protective shield were applied.  Duration of procedure and anesthesia administration:  Forty-five minutes.  Patient tolerated the procedure well in general and left the operating room for the recovery room in good condition. Dictated by:   Guadelupe Sabin, M.D. Attending Physician:  Ivor Messier DD:  10/24/01 TD:  10/24/01 Job: [id number]

## 2011-02-26 NOTE — Cardiovascular Report (Signed)
Bonnie Roberts, Bonnie Roberts               ACCOUNT NO.:  [id number]   MEDICAL RECORD NO.:  [id number]          PATIENT TYPE:  OIB   LOCATION:  2899                         FACILITY:  [facility]   PHYSICIAN:  Salvadore Farber, M.D. LHCDATE OF BIRTH:  May 29, 1922   DATE OF PROCEDURE:  02/26/2005  DATE OF DISCHARGE:                              CARDIAC CATHETERIZATION   PROCEDURE:  Left heart catheterization, left ventriculography, coronary  angiography, Starclose closure of the right common femoral arteriotomy site.   INDICATIONS:  Ms. Mccaig is an 77 year old lady with longstanding atrial  fibrillation, who presents with chest discomfort occurring primarily at  rest.  she had a negative Cardiolite.  However, she has continued to have  symptoms, and Dr. Amador Cunas requested catheterization to definitively  exclude coronary disease as the etiology of her chest pain.  I concurred and  brought her today for planned diagnostic angiography.   PROCEDURAL TECHNIQUE:  Informed consent was obtained.  Under 1% lidocaine  local anesthesia, a 5 French sheath was placed in the right common femoral  artery using the modified Seldinger technique.  Diagnostic angiography and  ventriculography were performed using JL-4, JR-4, and pigtail catheters.  The arteriotomy was then closed using a Starclose device.  Complete  hemostasis was obtained.  She was then transferred to the holding room in  stable condition, having tolerate the procedure well.   COMPLICATIONS:  None.   FINDINGS:  1.  LV:  139/12/38.  EF approximately 65%, without regional wall motion      abnormality.  2.  No aortic stenosis or mitral regurgitation.  3.  Left main:  Angiographically normal.  4.  LAD:  Moderate-size vessel giving rise to a single small diagonal.      There are only minor luminal irregularities.  5.  Circumflex:  Moderate-size vessel, giving rise to a single obtuse      marginal.  There are only minor luminal irregularities  along its course.  6.  RCA:  Moderate-size dominant vessel.  There is a 30% stenosis of the      midvessel.   IMPRESSION/RECOMMENDATIONS:  The patient has minimal coronary disease.  I  suspect a noncardiac etiology to her chest discomfort.  Will resume her  Coumadin tonight for her atrial fibrillation.      WED/MEDQ  D:  02/26/2005  T:  02/26/2005  Job:  [id number]   cc:   Gordy Savers, M.D. [facility]   Salvadore Farber, M.D. [facility]  [address]  [address]  [place]  [place] [address]

## 2011-02-26 NOTE — H&P (Signed)
[place]. [facility]  Patient:    Bonnie Roberts, Bonnie Roberts Visit Number: [id number] MRN: [id number]          Service Type: DSU Location: [facility] 2899 20 Attending Physician:  Ivor Messier Dictated by:   Guadelupe Sabin, M.D. Admit Date:  10/24/2001 Discharge Date: 10/24/2001                           History and Physical  REASON FOR ADMISSION:  This was a planned outpatient surgical admission of this 77 year old white female admitted for cataract/implant surgery of the right eye.  PRESENT ILLNESS:  This patient had previous cataract/implant surgery of the left eye performed on April 25, 2000.  Patient stated that following surgery there was some infection and noted at the first postoperative visit.  This, however, fortunately cleared but the patient still feels that she has a haze in front of her left eye.  Examination in my office on July 20, 2000 revealed a visual acuity of 20/40, right eye, 20/20, left eye.  Slitlamp examination revealed nuclear cataract formation of the right eye, posterior chamber intraocular lens implant with a hazy posterior capsule.  Patient continued to complain of deterioration of vision in the right eye when she was last seen in the office on July 17, 2001.  Examination revealed 20/40 compared to 20/20 in the left eye.  More advanced nuclear cataract formation was noted and the patient elected to proceed with cataract/implant surgery due to her blurred vision.  She signed an informed consent and arrangements were made for her outpatient admission at this time.  PAST MEDICAL HISTORY:  Patient is under the care of Dr. Gordy Savers with the [facility] and is felt to be in stable general condition and an average risk.  Dr. Amador Cunas notes the patients underlying problems of hypertension, osteoarthritis, colon polyps.  CURRENT MEDICATIONS:  Her current medications include Premarin, HCTZ, ______.  ALLERGIES:   Said to be allergic to PENICILLIN, SULFA, CODEINE and FLAGYL.  REVIEW OF SYSTEMS:  No cardiorespiratory complaints.  PHYSICAL EXAMINATION:  VITAL SIGNS:  As record on admission, blood pressure 139/83, temperature 96.8, heart rate 71, respirations 18.  GENERAL APPEARANCE:  Patient is a pleasant, well-nourished, well-developed 77 year old white female in no acute distress.  HEENT:  Eyes:  Visual acuity as noted above.  Nuclear cataract formation is noted in the right eye and a posterior chamber intraocular lens in the left eye.  Applanation tonometry is normal and fundus examination normal.  CHEST:  Lungs clear to percussion and auscultation.  HEART:  Normal sinus rhythm.  No cardiomegaly.  No murmurs.  ABDOMEN:  Negative.  EXTREMITIES:  Negative.  ADMISSION DIAGNOSES: 1. Senile cataract, right eye. 2. Pseudophakia, left eye.  SURGICAL PLAN:  Cataract/implant surgery, right eye. Dictated by:   Guadelupe Sabin, M.D. Attending Physician:  Ivor Messier DD:  10/24/01 TD:  10/24/01 Job: [id number]

## 2011-02-26 NOTE — H&P (Signed)
[place]. [facility]  Patient:    Bonnie Roberts, STRUVE Visit Number: [id number] MRN: [id number]          Service Type: SUR Location: 5000 5040 01 Attending Physician:  Carolan Shiver Ii Dictated by:   Arnoldo Morale, P.A. Admit Date:  02/12/2002                           History and Physical  DATE OF BIRTH:  03-06-1922  CHIEF COMPLAINT:  Progressively worsening right knee pain for the last five years.  HISTORY OF PRESENT ILLNESS:  This 77 year old white female patient presented to Dr. Priscille Kluver with a five year history of gradual onset of progressively worsening right knee pain.  She has had no known injury or prior surgery to her knee, but the pain has gotten much worse since December of 2002.  At this point, the right knee pain is intermittent but constant with any weightbearing activity.  The pain is described as an aching sensation on the anterior joint line with occasional radiation of the pain distally to her foot.  Pain increases first thing in the morning when she gets up and is accompanied by a lot of stiffness.  It does get better a little bit with time.  The pain does decrease once she kind of maneuvers around and she can get her knee to pop. The knee does giveway at times, it also pops.  It does swell.  There is no real night pain, locking, catching, or grinding.  She is currently taking Darvocet for pain and that provides a moderate amount of relief.  She has had cortisone injections throughout the years and the first one helped but the most recent one did not.  She does not ambulate with any assistive devices.  ALLERGIES:  1. CODEINE causes nausea and vomiting.  2. PENICILLIN causes a rash.  3. SULFA causes a rash.  4. NSAIDS caused a GI bleed.  CURRENT MEDICATIONS:  1. Hydrochlorothiazide 25 mg one tablet p.o. q.a.m.  2. Darvocet-N 100 one to two p.o. q.4. hours p.r.n. for pain.  3. Premarin vaginal cream 0.625 mg/gm inserted  vaginally three times a week.  4. Ultravate 0.05% cream apply once daily to area of erythema on right shin.  PAST MEDICAL HISTORY:  1. She was diagnosed with hypertension four to five years ago.  2. She does have a hiatal hernia with mild gastroesophageal reflux disease.     This really does not cause her much problem at this time.  3. She has had eczema on her legs in the past.  4. She does have a history of DVT times two; the first one after childbirth     and then one another time which she cannot remember.  5. She also has had a history of superficial phlebitis in the past.  6. She denies any history of diabetes mellitus, thyroid disease, peptic ulcer     disease, heart disease, asthma, or any other chronic medical condition     other than noted previously.  PAST SURGICAL HISTORY:  1. Tonsillectomy in 1929.  2. Vein ligation and left groin hernia repair 1949.  3. Complete hysterectomy in 1959.  4. Open cholecystectomy in 1960 by Dr. Terri Piedra.  5. Excision of right arm lipoma by Dr. Rozetta Nunnery.  6. Mortons neuroma excision left foot in 1987 by Dr. Fannie Knee.  7. Excision of Mortons neuroma right foot in 1987 by Dr.  Fannie Knee.  8. Excision Mortons neuroma left foot by Dr. Priscille Kluver.  9. Rectocele and bladder tuck by Dr. Richardean Chimera in 1995. 10. Left eye cataract removal by Dr. Dione Booze in July of 2001. 11. Right eye cataract removal by Dr. Cecilie Kicks in January of 2003. 12. Right carpal tunnel release in the 1970s by Dr. Fannie Knee.  SOCIAL HISTORY:  She denies any history of cigarette smoking, alcohol use or drug use.  She is a widow and lives with one of her daughters who is handicapped in a one-story house with one step into the main entrance.  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  She has three living children and one son who passed away.  Her medical doctor is Dr. Eleonore Chiquito and his phone number is [phone].  FAMILY HISTORY:  Her mother died at  the age of 71 with a history of irregular heartbeat and hypertension.  Her father died at the age of 51 with a stroke and cancer of the throat.  She has one brother who is alive at age 67 with colon and lung cancer.  She had two brothers who passed away; one at age 73 due to asthma and pneumonia and one in his 78s or 65s due to a gunshot wound.  She has one sister age 75 who is alive and well, and one sister who passed away at age 67 with lung cancer.  She has one son age 37, one age 56, and then the daughter age 74, and they are all alive and healthy.  She had one son who died at the age of 31 with a myocardial infarction.  REVIEW OF SYSTEMS:  RECTAL:  She does have a history of bleeding hemorrhoids about two weeks ago which she treated with Anusol suppositories.  HEENT:  She does have some problems with decreased hearing acuity but does not use hearing aids.  She does wear glasses.  No dentures.  She does have an occasional allergy and she is especially allergic to perfume.  She does complain of hoarseness on-and-off.  She has some osteoarthritis and stiffness in her neck.  GI:  She does have an occasional bout of constipation which she treats with prunes.  She did have jaundice at age 4 but has had no recurrence of that. GU:  She does have some urinary urgency at times.  MUSCULOSKELETAL:  She has been diagnosed with bursitis of the hip in the past.  She does have a living will and her power of attorney are her sons.  All other systems are negative and noncontributory at this time.  PHYSICAL EXAMINATION:  GENERAL:  Well-developed, well-nourished overweight white female who walks with an antalgic gait.  Mood and affect are appropriate.  Talks easily with examiner.  Height is 5 feet 2 inches, weight 174 pounds, BMI is 31. VITAL SIGNS:  Temperature 97.6 degrees Fahrenheit, pulse 84, respirations 14, and blood pressure 158/88.  HEENT:  Normocephalic, atraumatic without  frontal or maxillary sinus tenderness to palpation.  Conjunctivae are pink, sclerae anicteric.  PERRLA.  EOMs intact.  No visible external ear deformities.  Hearing is slightly decreased bilaterally.  Tympanic membranes are pearly gray bilaterally with good light reflex.  Nose and nasal septum midline.  Nasal mucosa pink and moist without exudates or polyps noted.  The oral mucosa is pink and moist. Good dentition.  Pharynx without erythema or exudates.  Tongue and uvula midline.  Tongue without  fasciculations and uvula rises equally with phonation.  NECK:  No visible masses or lesions noted.  Trachea midline.  No palpable lymphadenopathy, no thyromegaly.  She has full flexion and extension but decreased lateral bending and rotation to both sides.  Nontender to palpation along the cervical spine.  CARDIOVASCULAR:  Heart rate and rhythm regular.  S1 and S2 present without rubs, clicks, or murmurs noted.  RESPIRATORY:  Respirations even and unlabored.  Breath sounds clear to auscultation bilaterally without rales or wheezes noted.  ABDOMEN:  Rounded abdominal contour.  Well healed right upper quadrant and kind of curvilinear midline incision.  Bowel sounds present times four quadrants.  Soft and nontender to palpation without hepatosplenomegaly nor CVA tenderness.  Femoral pulses are +2 bilaterally.  Nontender to palpation along the entire length of the vertebral column.  BREASTS/GU/RECTAL/PELVIC:  These exams deferred at this time.  MUSCULOSKELETAL:  The only deformity noted of her upper extremities is her right fourth finger.  She has no range of motion at the DIP joint.  She cannot make a tight fist with that one finger.  No other deformity noted.  She does have tenderness to palpation over the right fourth finger DIP joint.  She has full range of motion of her shoulders, elbows, and wrists without pain. Radial pulses +2.  She has full range of motion of her hips, ankles, and  toes bilaterally.  DP and PT pulses are +2.  There is no lower extremity edema. The left knee has about a 10 to 15 degree valgus deformity.  It has full extension with flexion to 130 degrees.  There is no crepitus with range of motion of the left knee but she does have some mild pain with palpation along the medial joint line.  No effusion and collateral ligaments otherwise stable.  The right knee also appears to be in a slight valgus position.  It has full extension and flexion only to 115 degrees.  There is a minimal amount of crepitus with range of motion of the knee and a +1 effusion.  She does have pain with patellar apprehension test.  She is acutely tender to palpation over the medial joint line and less so over the lateral joint line.  Unable to check collateral stability due to guarding.  NEUROLOGIC:  Alert and oriented times three.  Cranial nerves II-XII are grossly intact.  Strength 5/5 bilateral upper and lower extremities.  Rapid alternating movements intact.  Deep tendon reflexes 2+ bilateral upper and lower extremities.  Sensation intact to light touch.  RADIOLOGIC FINDINGS:  X-rays taken in April of 2003 show bone against bone contact in the medial compartment of the right knee.  There is no significant arthritis noted in her hips bilaterally.  IMPRESSION: 1. End-stage osteoarthritis right knee. 2. Hypertension. 3. Hiatal hernia with mild gastroesophageal reflux disease. 4. Eczema. 5. History of deep vein thrombosis times two. 6. History of superficial thrombophlebitis. 7. History of hemorrhoids.  PLAN:  Ms. Teed will be admitted to [facility] on Feb 12, 2002, where she will undergo a right total knee arthroplasty by Dr. Jonny Ruiz L. Rendall.  She will undergo all the routine preoperative laboratory tests and studies prior to this procedure. Dictated by:   Arnoldo Morale, P.A. Attending Physician:  Carolan Shiver Ii DD:  02/02/02 TD:  02/03/02 Job:  [id number]

## 2011-03-04 ENCOUNTER — Ambulatory Visit (INDEPENDENT_AMBULATORY_CARE_PROVIDER_SITE_OTHER): Payer: Medicare Other | Admitting: Internal Medicine

## 2011-03-04 DIAGNOSIS — Z7901 Long term (current) use of anticoagulants: Secondary | ICD-10-CM

## 2011-03-04 DIAGNOSIS — I4891 Unspecified atrial fibrillation: Secondary | ICD-10-CM

## 2011-03-04 NOTE — Patient Instructions (Signed)
Same dose 

## 2011-04-01 ENCOUNTER — Encounter: Payer: Self-pay | Admitting: Internal Medicine

## 2011-04-01 ENCOUNTER — Ambulatory Visit (INDEPENDENT_AMBULATORY_CARE_PROVIDER_SITE_OTHER): Payer: Medicare Other | Admitting: Internal Medicine

## 2011-04-01 ENCOUNTER — Ambulatory Visit: Payer: Medicare Other

## 2011-04-01 DIAGNOSIS — Z7901 Long term (current) use of anticoagulants: Secondary | ICD-10-CM

## 2011-04-01 DIAGNOSIS — I4891 Unspecified atrial fibrillation: Secondary | ICD-10-CM

## 2011-04-01 DIAGNOSIS — I10 Essential (primary) hypertension: Secondary | ICD-10-CM

## 2011-04-01 MED ORDER — TRAMADOL HCL 50 MG PO TABS: 50.0000 mg | ORAL_TABLET | Freq: Four times a day (QID) | ORAL | Status: AC | PRN

## 2011-04-01 NOTE — Patient Instructions (Addendum)
Same dose of coumadin  You  may move around, but avoid painful motions and activities.  Apply ice to the sore area for 15 to 20 minutes 3 or 4 times daily for the next two to 3 days.  Tramadol for pain as directed

## 2011-04-01 NOTE — Progress Notes (Signed)
  Subjective:    Patient ID: Bonnie Roberts, female    DOB: [birth year], 77 y.o.   MRN: [id number]  HPI  an 77 year old patient who presents with a four-week history of left shoulder pain. There's been no recent trauma. She does have a history of osteoarthritis. She is on chronic Coumadin anticoagulation.    Review of Systems  Musculoskeletal: Positive for back pain.       Objective:   Physical Exam  Constitutional: She appears well-nourished. No distress.       Blood pressure well controlled. No distress  Musculoskeletal:       The patient had tenderness involving her left upper back region. Range of motion of the left shoulder intact without tenderness          Assessment & Plan:  Musculoligamentous left upper back pain. Will continue on warm compresses Tylenol. Was given a prescription for tramadol. Will call if unimproved Atrial fibrillation stable Hypertension stable

## 2011-04-09 ENCOUNTER — Other Ambulatory Visit: Payer: Self-pay | Admitting: Dermatology

## 2011-04-29 ENCOUNTER — Ambulatory Visit (INDEPENDENT_AMBULATORY_CARE_PROVIDER_SITE_OTHER): Payer: Medicare Other | Admitting: Internal Medicine

## 2011-04-29 DIAGNOSIS — Z7901 Long term (current) use of anticoagulants: Secondary | ICD-10-CM

## 2011-04-29 DIAGNOSIS — I4891 Unspecified atrial fibrillation: Secondary | ICD-10-CM

## 2011-04-29 LAB — POCT INR: INR: 2

## 2011-04-29 NOTE — Patient Instructions (Signed)
Same dose of coumadin 

## 2011-05-20 ENCOUNTER — Other Ambulatory Visit: Payer: Self-pay | Admitting: Internal Medicine

## 2011-05-21 ENCOUNTER — Ambulatory Visit: Payer: Medicare Other | Admitting: Internal Medicine

## 2011-05-27 ENCOUNTER — Ambulatory Visit (INDEPENDENT_AMBULATORY_CARE_PROVIDER_SITE_OTHER): Payer: Medicare Other | Admitting: Internal Medicine

## 2011-05-27 DIAGNOSIS — Z7901 Long term (current) use of anticoagulants: Secondary | ICD-10-CM

## 2011-05-27 DIAGNOSIS — I4891 Unspecified atrial fibrillation: Secondary | ICD-10-CM

## 2011-05-27 LAB — POCT INR: INR: 2.5

## 2011-05-27 NOTE — Patient Instructions (Signed)
Same dose 

## 2011-06-28 ENCOUNTER — Other Ambulatory Visit (INDEPENDENT_AMBULATORY_CARE_PROVIDER_SITE_OTHER): Payer: Medicare Other | Admitting: Internal Medicine

## 2011-06-28 DIAGNOSIS — I4891 Unspecified atrial fibrillation: Secondary | ICD-10-CM

## 2011-06-28 DIAGNOSIS — Z7901 Long term (current) use of anticoagulants: Secondary | ICD-10-CM

## 2011-06-28 NOTE — Patient Instructions (Signed)
Same dose 

## 2011-07-06 ENCOUNTER — Encounter: Payer: Self-pay | Admitting: Internal Medicine

## 2011-07-06 ENCOUNTER — Ambulatory Visit (INDEPENDENT_AMBULATORY_CARE_PROVIDER_SITE_OTHER): Payer: Medicare Other | Admitting: Internal Medicine

## 2011-07-06 VITALS — BP 116/80 | Temp 97.8°F | Wt 172.0 lb

## 2011-07-06 DIAGNOSIS — I10 Essential (primary) hypertension: Secondary | ICD-10-CM

## 2011-07-06 DIAGNOSIS — I872 Venous insufficiency (chronic) (peripheral): Secondary | ICD-10-CM

## 2011-07-06 DIAGNOSIS — I4891 Unspecified atrial fibrillation: Secondary | ICD-10-CM

## 2011-07-06 DIAGNOSIS — T148XXA Other injury of unspecified body region, initial encounter: Secondary | ICD-10-CM

## 2011-07-06 DIAGNOSIS — Z0000 Encounter for general adult medical examination without abnormal findings: Secondary | ICD-10-CM

## 2011-07-06 DIAGNOSIS — Z23 Encounter for immunization: Secondary | ICD-10-CM

## 2011-07-06 NOTE — Progress Notes (Signed)
  Subjective:    Patient ID: Bonnie Roberts, female    DOB: [birth year], 77 y.o.   MRN: [id number]  HPI  77 year old patient who has a history of chronic venous insufficiency picture for ablation who has been on chronic Coumadin anticoagulation. Approximately one week ago she dropped an object on the dorsal aspect of her left foot.  She initially ice this down for 24 hours and the pain and swelling improved. Sunday she went to church and was more active and yesterday she went to a wedding. Subsequently she has developed more pain involving the dorsal aspect of the left foot. An INR was 2.9  7 days ago.    Review of Systems  Constitutional: Negative.   HENT: Negative for hearing loss, congestion, sore throat, rhinorrhea, dental problem, sinus pressure and tinnitus.   Eyes: Negative for pain, discharge and visual disturbance.  Respiratory: Negative for cough and shortness of breath.   Cardiovascular: Negative for chest pain, palpitations and leg swelling.  Gastrointestinal: Negative for nausea, vomiting, abdominal pain, diarrhea, constipation, blood in stool and abdominal distention.  Genitourinary: Negative for dysuria, urgency, frequency, hematuria, flank pain, vaginal bleeding, vaginal discharge, difficulty urinating, vaginal pain and pelvic pain.  Musculoskeletal: Positive for joint swelling and gait problem. Negative for arthralgias.  Skin: Negative for rash.  Neurological: Negative for dizziness, syncope, speech difficulty, weakness, numbness and headaches.  Hematological: Negative for adenopathy.  Psychiatric/Behavioral: Negative for behavioral problems, dysphoric mood and agitation. The patient is not nervous/anxious.        Objective:   Physical Exam  Constitutional: She appears well-developed and well-nourished. No distress.       No acute distress. Blood pressure normal. Weight 172  Musculoskeletal:       There is minimal soft tissue swelling and tenderness involving the dorsal  aspect of her left foot. No signs of active infection          Assessment & Plan:   Contusion dorsal aspect left foot. The patient will attempt to keep off the foot and elevate over the next few days. She was concerned about infection or possibly recurrent blood clot. She was reassured she will call if there is any worsening pain or swelling Atrial fibrillation with chronic Coumadin anticoagulation  She return as scheduled for followup. Low salt diet encouraged

## 2011-07-06 NOTE — Patient Instructions (Signed)
Keep left leg elevated as much as possible  Tylenol 1-2 tabs po q4h prn  Call or return to clinic prn if these symptoms worsen or fail to improve as anticipated.

## 2011-07-26 ENCOUNTER — Ambulatory Visit (INDEPENDENT_AMBULATORY_CARE_PROVIDER_SITE_OTHER): Payer: Medicare Other | Admitting: Internal Medicine

## 2011-07-26 ENCOUNTER — Encounter: Payer: Self-pay | Admitting: Internal Medicine

## 2011-07-26 ENCOUNTER — Ambulatory Visit: Payer: Medicare Other

## 2011-07-26 DIAGNOSIS — I4891 Unspecified atrial fibrillation: Secondary | ICD-10-CM

## 2011-07-26 DIAGNOSIS — I10 Essential (primary) hypertension: Secondary | ICD-10-CM

## 2011-07-26 DIAGNOSIS — Z7901 Long term (current) use of anticoagulants: Secondary | ICD-10-CM

## 2011-07-26 DIAGNOSIS — I872 Venous insufficiency (chronic) (peripheral): Secondary | ICD-10-CM

## 2011-07-26 LAB — POCT INR: INR: 2.9

## 2011-07-26 NOTE — Patient Instructions (Addendum)
Limit your sodium (Salt) intake    It is important that you exercise regularly, at least 20 minutes 3 to 4 times per week.  If you develop chest pain or shortness of breath seek  medical attention.  Monthly prothrombin times  Return in 4 months for follow-up  Coumadin dose   Latest dosing instructions   Total Sun Mon Tue Wed Thu Fri Sat   13 2 mg 1 mg 2 mg 2 mg 2 mg 2 mg 2 mg    (2 mg1) (2 mg0.5) (2 mg1) (2 mg1) (2 mg1) (2 mg1) (2 mg1)

## 2011-07-26 NOTE — Progress Notes (Signed)
  Subjective:    Patient ID: Bonnie Roberts, female    DOB: October 07, 1922, 77 y.o.   MRN: [id number]  HPI  77 year old patient who is seen today for her quarterly followup. She has a history of treated hypertension and chronic atrial fibrillation. She remains on chronic Coumadin anticoagulation. She also has a history of chronic lower extremity venous insufficiency and chronic left leg edema. She has done reasonably well today. No concerns or complaints. Denies any symptoms of heart failure. Her left leg edema is about the same she also has a history of anxiety depression which has been stable.   Review of Systems  Constitutional: Negative.   HENT: Negative for hearing loss, congestion, sore throat, rhinorrhea, dental problem, sinus pressure and tinnitus.   Eyes: Negative for pain, discharge and visual disturbance.  Respiratory: Negative for cough and shortness of breath.   Cardiovascular: Positive for leg swelling. Negative for chest pain and palpitations.  Gastrointestinal: Negative for nausea, vomiting, abdominal pain, diarrhea, constipation, blood in stool and abdominal distention.  Genitourinary: Negative for dysuria, urgency, frequency, hematuria, flank pain, vaginal bleeding, vaginal discharge, difficulty urinating, vaginal pain and pelvic pain.  Musculoskeletal: Negative for joint swelling, arthralgias and gait problem.  Skin: Negative for rash.  Neurological: Negative for dizziness, syncope, speech difficulty, weakness, numbness and headaches.  Hematological: Negative for adenopathy.  Psychiatric/Behavioral: Negative for behavioral problems, dysphoric mood and agitation. The patient is not nervous/anxious.        Objective:   Physical Exam  Constitutional: She is oriented to person, place, and time. She appears well-developed and well-nourished.  HENT:  Head: Normocephalic.  Right Ear: External ear normal.  Left Ear: External ear normal.  Mouth/Throat: Oropharynx is clear and  moist.  Eyes: Conjunctivae and EOM are normal. Pupils are equal, round, and reactive to light.  Neck: Normal range of motion. Neck supple. No thyromegaly present.  Cardiovascular: Normal rate, regular rhythm, normal heart sounds and intact distal pulses.   Pulmonary/Chest: Effort normal and breath sounds normal.  Abdominal: Soft. Bowel sounds are normal. She exhibits no mass. There is no tenderness.  Musculoskeletal: Normal range of motion.       Chronic left lower leg edema Soft subcutaneous nodule present involving the ulnar aspect of the left wrist- appear to be cystic  Lymphadenopathy:    She has no cervical adenopathy.  Neurological: She is alert and oriented to person, place, and time.  Skin: Skin is warm and dry. No rash noted.  Psychiatric: She has a normal mood and affect. Her behavior is normal.          Assessment & Plan:   Chronic atrial fibrillation. We'll continue rate control medications and Coumadin anticoagulation. We'll check a pro time Hypertension stable Chronic venous insufficiency Anxiety depression stable  Low salt diet recommended  recheck in 4 months

## 2011-08-05 ENCOUNTER — Ambulatory Visit: Payer: Medicare Other | Admitting: Internal Medicine

## 2011-08-10 ENCOUNTER — Encounter: Payer: Self-pay | Admitting: Cardiovascular Disease

## 2011-08-10 ENCOUNTER — Ambulatory Visit (INDEPENDENT_AMBULATORY_CARE_PROVIDER_SITE_OTHER): Payer: Medicare Other | Admitting: Cardiovascular Disease

## 2011-08-10 VITALS — BP 142/94 | HR 82 | Ht 61.0 in | Wt 172.0 lb

## 2011-08-10 DIAGNOSIS — I4891 Unspecified atrial fibrillation: Secondary | ICD-10-CM

## 2011-08-10 DIAGNOSIS — R609 Edema, unspecified: Secondary | ICD-10-CM

## 2011-08-10 DIAGNOSIS — I10 Essential (primary) hypertension: Secondary | ICD-10-CM

## 2011-08-10 NOTE — Assessment & Plan Note (Signed)
Well controlled.  Continue current medications and low sodium Dash type diet.    

## 2011-08-10 NOTE — Assessment & Plan Note (Signed)
Good rate control and anticoagulation  

## 2011-08-10 NOTE — Patient Instructions (Signed)
Your physician wants you to follow-up in: 6 MONTHS You will receive a reminder letter in the mail two months in advance. If you don't receive a letter, please call our office to schedule the follow-up appointment. 

## 2011-08-10 NOTE — Progress Notes (Signed)
Bonnie Roberts was recently hospitalized for SOB and palpitations. She is getting some forgetful and anxious. She has chronic afib with good rate control and anticoagulation. INR was 2.3 today. She had a F/U echo which showed normal EF and only moderate TR. Chronic LE edema from venous insuficiency. No palpitaitons, SOB, or SSCP. No history of CAD. Still a good coumadin candidate despite age.   Getting INR checke at Dr Charm Rings office as it is less expensive  ROS: Denies fever, malais, weight loss, blurry vision, decreased visual acuity, cough, sputum, SOB, hemoptysis, pleuritic pain, palpitaitons, heartburn, abdominal pain, melena, lower extremity edema, claudication, or rash.  All other systems reviewed and negative  General: Affect appropriate Healthy:  appears stated age HEENT: normal Neck supple with no adenopathy JVP normal no bruits no thyromegaly Lungs clear with no wheezing and good diaphragmatic motion Heart:  S1/S2 no murmur,rub, gallop or click PMI normal Abdomen: benighn, BS positve, no tenderness, no AAA no bruit.  No HSM or HJR Distal pulses intact with no bruits No edema Neuro non-focal Skin warm and dry No muscular weakness   Current Outpatient Prescriptions  Medication Sig Dispense Refill  . atenolol (TENORMIN) 25 MG tablet Take 25 mg by mouth daily.        Marland Kitchen b complex vitamins capsule Take 1 capsule by mouth daily.        Marland Kitchen diltiazem (CARDIZEM CD) 120 MG 24 hr capsule TAKE 1 CAPSULE EVERY DAY  90 capsule  3  . docusate sodium (COLACE) 100 MG capsule Take 100 mg by mouth 2 (two) times daily.        . Flaxseed, Linseed, (BL FLAX SEED OIL) 1000 MG CAPS Take by mouth daily.        . furosemide (LASIX) 40 MG tablet Take 40 mg by mouth 2 (two) times daily.        . Multiple Vitamins-Minerals (CENTRUM SILVER PO) Take by mouth daily.        . Omega-3 Fatty Acids (FISH OIL) 1000 MG CAPS Take by mouth daily.        . potassium chloride SA (K-DUR,KLOR-CON) 20 MEQ tablet Take 20 mEq by  mouth 2 (two) times daily.        Marland Kitchen warfarin (COUMADIN) 2 MG tablet Take by mouth as directed.          Allergies  Codeine; Nsaids; Penicillins; and Sulfonamide derivatives  Electrocardiogram:   Afib 82  Nonspecific ST/T wave changes  Assessment and Plan

## 2011-08-10 NOTE — Assessment & Plan Note (Signed)
Venous insuf with spider veins.  Continue diuretics and support hose

## 2011-08-23 ENCOUNTER — Ambulatory Visit (INDEPENDENT_AMBULATORY_CARE_PROVIDER_SITE_OTHER): Payer: Medicare Other | Admitting: Internal Medicine

## 2011-08-23 DIAGNOSIS — I4891 Unspecified atrial fibrillation: Secondary | ICD-10-CM

## 2011-08-23 DIAGNOSIS — Z7901 Long term (current) use of anticoagulants: Secondary | ICD-10-CM

## 2011-08-23 LAB — POCT INR: INR: 2.8

## 2011-08-23 NOTE — Patient Instructions (Signed)
  Latest dosing instructions   Total Sun Mon Tue Wed Thu Fri Sat   13 2 mg 1 mg 2 mg 2 mg 2 mg 2 mg 2 mg    (2 mg1) (2 mg0.5) (2 mg1) (2 mg1) (2 mg1) (2 mg1) (2 mg1)

## 2011-09-22 ENCOUNTER — Ambulatory Visit (INDEPENDENT_AMBULATORY_CARE_PROVIDER_SITE_OTHER): Payer: Medicare Other

## 2011-09-22 DIAGNOSIS — I4891 Unspecified atrial fibrillation: Secondary | ICD-10-CM

## 2011-09-22 DIAGNOSIS — Z7901 Long term (current) use of anticoagulants: Secondary | ICD-10-CM

## 2011-09-22 NOTE — Patient Instructions (Signed)
  Latest dosing instructions   Total Sun Mon Tue Wed Thu Fri Sat   13 2 mg 1 mg 2 mg 2 mg 2 mg 2 mg 2 mg    (2 mg1) (2 mg0.5) (2 mg1) (2 mg1) (2 mg1) (2 mg1) (2 mg1)         

## 2011-09-23 ENCOUNTER — Encounter: Payer: Self-pay | Admitting: Internal Medicine

## 2011-10-09 ENCOUNTER — Other Ambulatory Visit: Payer: Self-pay | Admitting: Internal Medicine

## 2011-10-28 ENCOUNTER — Ambulatory Visit (INDEPENDENT_AMBULATORY_CARE_PROVIDER_SITE_OTHER): Payer: Medicare Other | Admitting: *Deleted

## 2011-10-28 DIAGNOSIS — I4891 Unspecified atrial fibrillation: Secondary | ICD-10-CM

## 2011-10-28 DIAGNOSIS — Z7901 Long term (current) use of anticoagulants: Secondary | ICD-10-CM

## 2011-10-28 DIAGNOSIS — Z5181 Encounter for therapeutic drug level monitoring: Secondary | ICD-10-CM

## 2011-10-28 NOTE — Patient Instructions (Signed)
  Latest dosing instructions   Total Sun Mon Tue Wed Thu Fri Sat   13 2 mg 1 mg 2 mg 2 mg 2 mg 2 mg 2 mg    (2 mg1) (2 mg0.5) (2 mg1) (2 mg1) (2 mg1) (2 mg1) (2 mg1)         

## 2011-11-22 ENCOUNTER — Ambulatory Visit: Payer: Medicare Other | Admitting: Internal Medicine

## 2011-11-29 ENCOUNTER — Ambulatory Visit (INDEPENDENT_AMBULATORY_CARE_PROVIDER_SITE_OTHER): Payer: Medicare Other | Admitting: Internal Medicine

## 2011-11-29 ENCOUNTER — Encounter: Payer: Self-pay | Admitting: Internal Medicine

## 2011-11-29 DIAGNOSIS — M1990 Unspecified osteoarthritis, unspecified site: Secondary | ICD-10-CM

## 2011-11-29 DIAGNOSIS — I872 Venous insufficiency (chronic) (peripheral): Secondary | ICD-10-CM

## 2011-11-29 DIAGNOSIS — I4891 Unspecified atrial fibrillation: Secondary | ICD-10-CM

## 2011-11-29 DIAGNOSIS — I10 Essential (primary) hypertension: Secondary | ICD-10-CM

## 2011-11-29 DIAGNOSIS — Z7901 Long term (current) use of anticoagulants: Secondary | ICD-10-CM

## 2011-11-29 LAB — POCT INR: INR: 2.3

## 2011-11-29 MED ORDER — ATENOLOL 25 MG PO TABS: 25.0000 mg | ORAL_TABLET | Freq: Every day | ORAL | Status: DC

## 2011-11-29 MED ORDER — DILTIAZEM HCL ER COATED BEADS 120 MG PO CP24: 120.0000 mg | ORAL_CAPSULE | Freq: Every day | ORAL | Status: DC

## 2011-11-29 MED ORDER — FUROSEMIDE 40 MG PO TABS: 40.0000 mg | ORAL_TABLET | Freq: Two times a day (BID) | ORAL | Status: DC

## 2011-11-29 NOTE — Patient Instructions (Addendum)
  Latest dosing instructions   Total Sun Mon Tue Wed Thu Fri Sat   13 2 mg 1 mg 2 mg 2 mg 2 mg 2 mg 2 mg    (2 mg1) (2 mg0.5) (2 mg1) (2 mg1) (2 mg1) (2 mg1) (2 mg1)       Return in 4 months for follow-up  Limit your sodium (Salt) intake

## 2011-11-29 NOTE — Progress Notes (Signed)
  Subjective:    Patient ID: Bonnie Roberts, female    DOB: 19-Oct-1921, 78 y.o.   MRN: [id number]  HPI  78 year old patient who is seen today for followup. She has a history of hypertension and permanent atrial for ablation. She remains on Coumadin anticoagulation and has done quite well. She has chronic venous insufficiency and does use furosemide with benefit. No major concerns or complaints except for some left ear pain. INR today 2.3 denies any chest pain or shortness of breath She does have osteoarthritis and is requesting a handicap placard. This was completed.    Review of Systems  Constitutional: Negative.   HENT: Negative for hearing loss, congestion, sore throat, rhinorrhea, dental problem, sinus pressure and tinnitus.   Eyes: Negative for pain, discharge and visual disturbance.  Respiratory: Negative for cough and shortness of breath.   Cardiovascular: Positive for leg swelling. Negative for chest pain and palpitations.  Gastrointestinal: Negative for nausea, vomiting, abdominal pain, diarrhea, constipation, blood in stool and abdominal distention.  Genitourinary: Negative for dysuria, urgency, frequency, hematuria, flank pain, vaginal bleeding, vaginal discharge, difficulty urinating, vaginal pain and pelvic pain.  Musculoskeletal: Positive for arthralgias and gait problem. Negative for joint swelling.  Skin: Negative for rash.  Neurological: Negative for dizziness, syncope, speech difficulty, weakness, numbness and headaches.  Hematological: Negative for adenopathy.  Psychiatric/Behavioral: Negative for behavioral problems, dysphoric mood and agitation. The patient is not nervous/anxious.        Objective:   Physical Exam  Constitutional: She is oriented to person, place, and time. She appears well-developed and well-nourished.  HENT:  Head: Normocephalic.  Right Ear: External ear normal.  Left Ear: External ear normal.  Mouth/Throat: Oropharynx is clear and moist.   Left tympanic membrane normal except for chronic changes. She has an old healed perforation the left canal is normal  She did have tenderness over the left TMJ that reproduced her pain  Eyes: Conjunctivae and EOM are normal. Pupils are equal, round, and reactive to light.  Neck: Normal range of motion. Neck supple. No thyromegaly present.  Cardiovascular: Normal rate, regular rhythm, normal heart sounds and intact distal pulses.   Pulmonary/Chest: Effort normal and breath sounds normal.  Abdominal: Soft. Bowel sounds are normal. She exhibits no mass. There is no tenderness.  Musculoskeletal: Normal range of motion.  Lymphadenopathy:    She has no cervical adenopathy.  Neurological: She is alert and oriented to person, place, and time.  Skin: Skin is warm and dry. No rash noted.  Psychiatric: She has a normal mood and affect. Her behavior is normal.          Assessment & Plan:  Hypertension well controlled Chronic atrial fibrillation. We'll continue chronic Coumadin anticoagulation we'll check INR in one month Osteoarthritis and chronic venous insufficiency. A handicap placard completed  Recheck in 4 months

## 2011-12-27 ENCOUNTER — Ambulatory Visit (INDEPENDENT_AMBULATORY_CARE_PROVIDER_SITE_OTHER): Payer: Medicare Other | Admitting: Internal Medicine

## 2011-12-27 DIAGNOSIS — Z7901 Long term (current) use of anticoagulants: Secondary | ICD-10-CM

## 2011-12-27 DIAGNOSIS — I4891 Unspecified atrial fibrillation: Secondary | ICD-10-CM

## 2011-12-27 LAB — POCT INR: INR: 2.6

## 2011-12-27 NOTE — Patient Instructions (Signed)
  Latest dosing instructions   Total Sun Mon Tue Wed Thu Fri Sat   13 2 mg 1 mg 2 mg 2 mg 2 mg 2 mg 2 mg    (2 mg1) (2 mg0.5) (2 mg1) (2 mg1) (2 mg1) (2 mg1) (2 mg1)         

## 2012-01-01 ENCOUNTER — Other Ambulatory Visit: Payer: Self-pay | Admitting: Internal Medicine

## 2012-01-24 ENCOUNTER — Ambulatory Visit (INDEPENDENT_AMBULATORY_CARE_PROVIDER_SITE_OTHER): Payer: Medicare Other | Admitting: Family

## 2012-01-24 ENCOUNTER — Encounter: Payer: Self-pay | Admitting: Family

## 2012-01-24 ENCOUNTER — Ambulatory Visit (INDEPENDENT_AMBULATORY_CARE_PROVIDER_SITE_OTHER)
Admission: RE | Admit: 2012-01-24 | Discharge: 2012-01-24 | Disposition: A | Discharge status: Home / Self Care | Payer: Medicare Other | Source: Ambulatory Visit | Attending: Family | Admitting: Family

## 2012-01-24 VITALS — BP 136/80 | Temp 99.0°F | Wt 166.0 lb

## 2012-01-24 DIAGNOSIS — I4891 Unspecified atrial fibrillation: Secondary | ICD-10-CM

## 2012-01-24 DIAGNOSIS — J209 Acute bronchitis, unspecified: Secondary | ICD-10-CM

## 2012-01-24 DIAGNOSIS — Z7901 Long term (current) use of anticoagulants: Secondary | ICD-10-CM

## 2012-01-24 DIAGNOSIS — J309 Allergic rhinitis, unspecified: Secondary | ICD-10-CM

## 2012-01-24 MED ORDER — BENZONATATE 100 MG PO CAPS: 100.0000 mg | ORAL_CAPSULE | Freq: Two times a day (BID) | ORAL | Status: AC | PRN

## 2012-01-24 MED ORDER — METHYLPREDNISOLONE 4 MG PO KIT: PACK | ORAL | Status: AC

## 2012-01-24 MED ORDER — LORAZEPAM 0.5 MG PO TABS: 0.5000 mg | ORAL_TABLET | Freq: Three times a day (TID) | ORAL | Status: AC

## 2012-01-24 NOTE — Patient Instructions (Addendum)
1. Xray at [facility] today.  Bronchitis Bronchitis is the body's way of reacting to injury and/or infection (inflammation) of the bronchi. Bronchi are the air tubes that extend from the windpipe into the lungs. If the inflammation becomes severe, it may cause shortness of breath. CAUSES  Inflammation may be caused by:  A virus.   Germs (bacteria).   Dust.   Allergens.   Pollutants and many other irritants.  The cells lining the bronchial tree are covered with tiny hairs (cilia). These constantly beat upward, away from the lungs, toward the mouth. This keeps the lungs free of pollutants. When these cells become too irritated and are unable to do their job, mucus begins to develop. This causes the characteristic cough of bronchitis. The cough clears the lungs when the cilia are unable to do their job. Without either of these protective mechanisms, the mucus would settle in the lungs. Then you would develop pneumonia. Smoking is a common cause of bronchitis and can contribute to pneumonia. Stopping this habit is the single most important thing you can do to help yourself. TREATMENT   Your caregiver may prescribe an antibiotic if the cough is caused by bacteria. Also, medicines that open up your airways make it easier to breathe. Your caregiver may also recommend or prescribe an expectorant. It will loosen the mucus to be coughed up. Only take over-the-counter or prescription medicines for pain, discomfort, or fever as directed by your caregiver.   Removing whatever causes the problem (smoking, for example) is critical to preventing the problem from getting worse.   Cough suppressants may be prescribed for relief of cough symptoms.   Inhaled medicines may be prescribed to help with symptoms now and to help prevent problems from returning.   For those with recurrent (chronic) bronchitis, there may be a need for steroid medicines.  SEEK IMMEDIATE MEDICAL CARE IF:   During treatment, you  develop more pus-like mucus (purulent sputum).   You have a fever.   Your baby is older than 3 months with a rectal temperature of 102 F (38.9 C) or higher.   Your baby is 55 months old or younger with a rectal temperature of 100.4 F (38 C) or higher.   You become progressively more ill.   You have increased difficulty breathing, wheezing, or shortness of breath.  It is necessary to seek immediate medical care if you are elderly or sick from any other disease. MAKE SURE YOU:   Understand these instructions.   Will watch your condition.   Will get help right away if you are not doing well or get worse.  Document Released: 09/27/2005 Document Revised: 09/16/2011 Document Reviewed: 08/06/2008 [facility] Patient Information 2012 [place], [place].    Latest dosing instructions   Total Sun Mon Tue Wed Thu Fri Sat   13 2 mg 1 mg 2 mg 2 mg 2 mg 2 mg 2 mg    (2 mg1) (2 mg0.5) (2 mg1) (2 mg1) (2 mg1) (2 mg1) (2 mg1)

## 2012-01-24 NOTE — Progress Notes (Signed)
Subjective:    Patient ID: Bonnie Roberts, female    DOB: 1922/09/05, 78 y.o.   MRN: [id number]  HPI Comments: 78 yo white female presented with c/o congestion, constant/hacking cough, runny nose, sinus drainage, sorethroat, and chills x one week. Denies cp, nausea, vomiting, or shortness of breath. C/o "crying spells and increased anxiety" getting progressively worse x months. Denies suicidal/homocidal ideations. "I have fought these s/s all my life, I need a refill on ativan." I need my coumadin labs drawn"  Sinusitis Associated symptoms include congestion, coughing, sinus pressure and a sore throat. Pertinent negatives include no ear pain, neck pain, shortness of breath or sneezing.      Review of Systems  Constitutional: Negative.   HENT: Positive for hearing loss, congestion, sore throat, rhinorrhea, voice change and sinus pressure. Negative for ear pain, nosebleeds, facial swelling, sneezing, drooling, mouth sores, trouble swallowing, neck pain, neck stiffness, tinnitus and ear discharge.   Eyes: Negative.   Respiratory: Positive for cough. Negative for apnea, choking, chest tightness, shortness of breath, wheezing and stridor.   Cardiovascular: Negative.   Psychiatric/Behavioral: Negative for suicidal ideas, behavioral problems, confusion, sleep disturbance, self-injury, dysphoric mood, decreased concentration and agitation. The patient is nervous/anxious.        C/o increased anxiety and intermittent crying spells   Past Medical History  Diagnosis Date  . ANXIETY DEPRESSION 12/11/2009  . Atrial fibrillation 04/10/2007  . COLONIC POLYPS, HX OF 04/10/2007  . DISORDER, MENOPAUSAL NOS 05/15/2007  . DYSPNEA 02/11/2009  . Edema 03/13/2009  . HYPERTENSION 04/10/2007  . Osteoarth NOS-Unspec 04/10/2007  . VENOUS INSUFFICIENCY, CHRONIC 01/22/2010    History   Social History  . Marital Status: Widowed    Spouse Name: N/A    Number of Children: N/A  . Years of Education: N/A   Occupational  History  . Not on file.   Social History Main Topics  . Smoking status: Never Smoker   . Smokeless tobacco: Never Used  . Alcohol Use: No  . Drug Use: No  . Sexually Active: Not on file   Other Topics Concern  . Not on file   Social History Narrative  . No narrative on file    Past Surgical History  Procedure Date  . Cholecystectomy   . Carpal tunnel release   . Abdominal hysterectomy   . Total knee arthroplasty     right  . Rectocele repair   . Bladder surgery     tacking  . Hernia repair   . Cardiac catheterization     No family history on file.  Allergies  Allergen Reactions  . Codeine     REACTION: n/v  . Nsaids     REACTION: gib  . Penicillins     REACTION: rash  . Sulfonamide Derivatives     REACTION: rash    Current Outpatient Prescriptions on File Prior to Visit  Medication Sig Dispense Refill  . atenolol (TENORMIN) 25 MG tablet Take 1 tablet (25 mg total) by mouth daily.  90 tablet  3  . b complex vitamins capsule Take 1 capsule by mouth daily.        Marland Kitchen diltiazem (CARDIZEM CD) 120 MG 24 hr capsule Take 1 capsule (120 mg total) by mouth daily.  90 capsule  3  . docusate sodium (COLACE) 100 MG capsule Take 100 mg by mouth 2 (two) times daily.        . Flaxseed, Linseed, (BL FLAX SEED OIL) 1000 MG CAPS Take by mouth  daily.        . furosemide (LASIX) 40 MG tablet Take 1 tablet (40 mg total) by mouth 2 (two) times daily.  180 tablet  3  . KLOR-CON M20 20 MEQ tablet TAKE 1 TABLET BY MOUTH TWICE A DAY  180 tablet  1  . Multiple Vitamins-Minerals (CENTRUM SILVER PO) Take by mouth daily.        . Omega-3 Fatty Acids (FISH OIL) 1000 MG CAPS Take by mouth daily.        Marland Kitchen warfarin (COUMADIN) 2 MG tablet TAKE AS DIRECTED BY COUMADIN CLINIC.  90 tablet  2    BP 136/80  Temp(Src) 99 F (37.2 C) (Oral)  Wt 166 lb (75.297 kg)chart     Objective:   Physical Exam  Constitutional: She is oriented to person, place, and time. She appears well-developed and  well-nourished. No distress.  HENT:  Right Ear: External ear normal.  Left Ear: External ear normal.  Mouth/Throat: Oropharyngeal exudate present.  Eyes: Conjunctivae are normal. Pupils are equal, round, and reactive to light. Right eye exhibits no discharge. Left eye exhibits no discharge.  Cardiovascular: Normal rate, regular rhythm, normal heart sounds and intact distal pulses.  Exam reveals no gallop and no friction rub.   No murmur heard. Pulmonary/Chest: Effort normal and breath sounds normal. No respiratory distress. She has no wheezes. She has no rales. She exhibits no tenderness.       Rhonchi right middle lobe  Neurological: She is alert and oriented to person, place, and time.  Skin: Skin is warm and dry. She is not diaphoretic.  Psychiatric: She has a normal mood and affect. Her behavior is normal. Judgment and thought content normal.          Assessment & Plan:  Assessment: Bronchitis Plan: Tesilon perrls, prednisone, z-pack, increase po fluids and rest. Lorazepam prn, RTC if s/s get worse. Go to ED if experience any suicidal ideations. Teaching handout on diagnosis and treatment provided. Labs: PT/INR. Chest xray

## 2012-02-08 ENCOUNTER — Ambulatory Visit (INDEPENDENT_AMBULATORY_CARE_PROVIDER_SITE_OTHER): Payer: Medicare Other | Admitting: Cardiovascular Disease

## 2012-02-08 ENCOUNTER — Encounter: Payer: Self-pay | Admitting: Cardiovascular Disease

## 2012-02-08 VITALS — BP 134/85 | HR 80 | Wt 163.0 lb

## 2012-02-08 DIAGNOSIS — I4891 Unspecified atrial fibrillation: Secondary | ICD-10-CM

## 2012-02-08 DIAGNOSIS — I872 Venous insufficiency (chronic) (peripheral): Secondary | ICD-10-CM

## 2012-02-08 DIAGNOSIS — I10 Essential (primary) hypertension: Secondary | ICD-10-CM

## 2012-02-08 DIAGNOSIS — R0602 Shortness of breath: Secondary | ICD-10-CM

## 2012-02-08 NOTE — Assessment & Plan Note (Signed)
Stable continue diuretics as needes.  Support hose and low sodium diet

## 2012-02-08 NOTE — Assessment & Plan Note (Signed)
Good rate control and anticoagulation  

## 2012-02-08 NOTE — Patient Instructions (Signed)
Your physician wants you to follow-up in:  6 MONTHS WITH DR NISHAN  You will receive a reminder letter in the mail two months in advance. If you don't receive a letter, please call our office to schedule the follow-up appointment. Your physician recommends that you continue on your current medications as directed. Please refer to the Current Medication list given to you today. 

## 2012-02-08 NOTE — Progress Notes (Signed)
Patient ID: Bonnie Roberts, female   DOB: [birth year], 78 y.o.   MRN: [id number] Bonnie Roberts was recently hospitalized for SOB and palpitations. She is getting some forgetful and anxious. She has chronic afib with good rate control and anticoagulation. INR was 2.3 today. She had a F/U echo which showed normal EF and only moderate TR. Chronic LE edema from venous insuficiency. No palpitaitons, SOB, or SSCP. No history of CAD. Still a good coumadin candidate despite age. Getting INR checke at Dr Charm Rings office as it is less expensive  ROS: Denies fever, malais, weight loss, blurry vision, decreased visual acuity, cough, sputum, SOB, hemoptysis, pleuritic pain, palpitaitons, heartburn, abdominal pain, melena, lower extremity edema, claudication, or rash.  All other systems reviewed and negative  General: Affect appropriate Healthy:  appears stated age HEENT: normal Neck supple with no adenopathy JVP normal no bruits no thyromegaly Lungs clear with no wheezing and good diaphragmatic motion Heart:  S1/S2 no murmur, no rub, gallop or click PMI normal Abdomen: benighn, BS positve, no tenderness, no AAA no bruit.  No HSM or HJR Distal pulses intact with no bruits No edema Neuro non-focal Skin warm and dry No muscular weakness   Current Outpatient Prescriptions  Medication Sig Dispense Refill  . atenolol (TENORMIN) 25 MG tablet Take 1 tablet (25 mg total) by mouth daily.  90 tablet  3  . b complex vitamins capsule Take 1 capsule by mouth daily.        Marland Kitchen diltiazem (CARDIZEM CD) 120 MG 24 hr capsule Take 1 capsule (120 mg total) by mouth daily.  90 capsule  3  . docusate sodium (COLACE) 100 MG capsule Take 100 mg by mouth 2 (two) times daily.        . Flaxseed, Linseed, (BL FLAX SEED OIL) 1000 MG CAPS Take by mouth daily.        . furosemide (LASIX) 40 MG tablet Take 1 tablet (40 mg total) by mouth 2 (two) times daily.  180 tablet  3  . KLOR-CON M20 20 MEQ tablet TAKE 1 TABLET BY MOUTH TWICE A DAY  180  tablet  1  . Multiple Vitamins-Minerals (CENTRUM SILVER PO) Take by mouth daily.        . Omega-3 Fatty Acids (FISH OIL) 1000 MG CAPS Take by mouth daily.        Marland Kitchen warfarin (COUMADIN) 2 MG tablet TAKE AS DIRECTED BY COUMADIN CLINIC.  90 tablet  2    Allergies  Codeine; Nsaids; Penicillins; and Sulfonamide derivatives  Electrocardiogram:  08/10/11  afib rate 82 otherwise normal  Assessment and Plan

## 2012-02-08 NOTE — Assessment & Plan Note (Signed)
Well controlled.  Continue current medications and low sodium Dash type diet.    

## 2012-02-08 NOTE — Assessment & Plan Note (Signed)
Recent URI  Clear today.  Has large hiatal hernia that bothers her as well

## 2012-02-21 ENCOUNTER — Ambulatory Visit (INDEPENDENT_AMBULATORY_CARE_PROVIDER_SITE_OTHER): Payer: Medicare Other | Admitting: Internal Medicine

## 2012-02-21 DIAGNOSIS — Z7901 Long term (current) use of anticoagulants: Secondary | ICD-10-CM

## 2012-02-21 DIAGNOSIS — I4891 Unspecified atrial fibrillation: Secondary | ICD-10-CM

## 2012-02-21 NOTE — Patient Instructions (Signed)
  Latest dosing instructions   Total Sun Mon Tue Wed Thu Fri Sat   13 2 mg 1 mg 2 mg 2 mg 2 mg 2 mg 2 mg    (2 mg1) (2 mg0.5) (2 mg1) (2 mg1) (2 mg1) (2 mg1) (2 mg1)         

## 2012-03-22 ENCOUNTER — Ambulatory Visit (INDEPENDENT_AMBULATORY_CARE_PROVIDER_SITE_OTHER): Payer: Medicare Other | Admitting: Family

## 2012-03-22 ENCOUNTER — Encounter: Payer: Self-pay | Admitting: Internal Medicine

## 2012-03-22 ENCOUNTER — Ambulatory Visit (INDEPENDENT_AMBULATORY_CARE_PROVIDER_SITE_OTHER): Payer: Medicare Other | Admitting: Internal Medicine

## 2012-03-22 VITALS — BP 110/70 | Wt 168.0 lb

## 2012-03-22 DIAGNOSIS — Z7901 Long term (current) use of anticoagulants: Secondary | ICD-10-CM

## 2012-03-22 DIAGNOSIS — F341 Dysthymic disorder: Secondary | ICD-10-CM

## 2012-03-22 DIAGNOSIS — I4891 Unspecified atrial fibrillation: Secondary | ICD-10-CM

## 2012-03-22 DIAGNOSIS — I10 Essential (primary) hypertension: Secondary | ICD-10-CM

## 2012-03-22 NOTE — Progress Notes (Signed)
  Subjective:    Patient ID: Bonnie Roberts, female    DOB: 1922/03/22, 78 y.o.   MRN: [id number]  HPI  78 year old patient who is seen today for followup. She has chronic atrial fibrillation on rate control and Coumadin anticoagulation. She is doing quite well and denies any cardiopulmonary complaints. She has osteoarthritis and one complaint is low back pain with stooping and bending. Other complaints include some altered taste sensation after using a tooth on whitening toothpaste. She has a history of anxiety depression which has been stable. She has chronic venous insufficiency but no history of lower extremity edema lately.    Review of Systems  Constitutional: Negative.   HENT: Negative for hearing loss, congestion, sore throat, rhinorrhea, dental problem, sinus pressure and tinnitus.   Eyes: Negative for pain, discharge and visual disturbance.  Respiratory: Negative for cough and shortness of breath.   Cardiovascular: Negative for chest pain, palpitations and leg swelling.  Gastrointestinal: Negative for nausea, vomiting, abdominal pain, diarrhea, constipation, blood in stool and abdominal distention.  Genitourinary: Negative for dysuria, urgency, frequency, hematuria, flank pain, vaginal bleeding, vaginal discharge, difficulty urinating, vaginal pain and pelvic pain.  Musculoskeletal: Positive for back pain. Negative for joint swelling, arthralgias and gait problem.  Skin: Negative for rash.  Neurological: Negative for dizziness, syncope, speech difficulty, weakness, numbness and headaches.  Hematological: Negative for adenopathy.  Psychiatric/Behavioral: Negative for behavioral problems, dysphoric mood and agitation. The patient is not nervous/anxious.        Objective:   Physical Exam  Constitutional: She is oriented to person, place, and time. She appears well-developed and well-nourished.  HENT:  Head: Normocephalic.  Right Ear: External ear normal.  Left Ear: External ear  normal.  Mouth/Throat: Oropharynx is clear and moist.  Eyes: Conjunctivae and EOM are normal. Pupils are equal, round, and reactive to light.  Neck: Normal range of motion. Neck supple. No thyromegaly present.  Cardiovascular: Normal rate, regular rhythm, normal heart sounds and intact distal pulses.   Pulmonary/Chest: Effort normal and breath sounds normal.  Abdominal: Soft. Bowel sounds are normal. She exhibits no mass. There is no tenderness.  Musculoskeletal: Normal range of motion.  Lymphadenopathy:    She has no cervical adenopathy.  Neurological: She is alert and oriented to person, place, and time.  Skin: Skin is warm and dry. No rash noted.  Psychiatric: She has a normal mood and affect. Her behavior is normal.          Assessment & Plan:   Chronic atrial fibrillation. Patient is stable we'll continue rate control medication as well as Coumadin anticoagulation Hypertension well controlled Low back pain secondary to osteoarthritis. History of anxiety depression stable  Medical regimen unchanged. We'll see in 3 months for a annual exam with laboratory update.

## 2012-03-22 NOTE — Patient Instructions (Signed)
Limit your sodium (Salt) intake  Return in 3 months for follow-up   

## 2012-03-22 NOTE — Patient Instructions (Addendum)
Take 2 tabs today, then resume usual dose.     Latest dosing instructions   Total Sun Mon Tue Wed Thu Fri Sat   13 2 mg 1 mg 2 mg 2 mg 2 mg 2 mg 2 mg    (2 mg1) (2 mg0.5) (2 mg1) (2 mg1) (2 mg1) (2 mg1) (2 mg1)

## 2012-03-28 ENCOUNTER — Ambulatory Visit: Payer: Medicare Other | Admitting: Internal Medicine

## 2012-04-05 ENCOUNTER — Ambulatory Visit (INDEPENDENT_AMBULATORY_CARE_PROVIDER_SITE_OTHER): Payer: Medicare Other | Admitting: Family

## 2012-04-05 DIAGNOSIS — I4891 Unspecified atrial fibrillation: Secondary | ICD-10-CM

## 2012-04-05 DIAGNOSIS — Z7901 Long term (current) use of anticoagulants: Secondary | ICD-10-CM

## 2012-04-05 NOTE — Patient Instructions (Addendum)
Continue same dose,1 mg only on mondays only,  And 2mg  all other days, check in 3 weeks.    Latest dosing instructions   Total Sun Mon Tue Wed Thu Fri Sat   13 2 mg 1 mg 2 mg 2 mg 2 mg 2 mg 2 mg    (2 mg1) (2 mg0.5) (2 mg1) (2 mg1) (2 mg1) (2 mg1) (2 mg1)

## 2012-04-26 ENCOUNTER — Ambulatory Visit (INDEPENDENT_AMBULATORY_CARE_PROVIDER_SITE_OTHER): Payer: Medicare Other | Admitting: Family

## 2012-04-26 DIAGNOSIS — I4891 Unspecified atrial fibrillation: Secondary | ICD-10-CM

## 2012-04-26 DIAGNOSIS — Z7901 Long term (current) use of anticoagulants: Secondary | ICD-10-CM

## 2012-04-26 LAB — POCT INR: INR: 2

## 2012-04-26 NOTE — Patient Instructions (Addendum)
Continue same dose,1 mg only on mondays only,  And 2mg  all other days, check in 4 weeks    Latest dosing instructions   Total Sun Mon Tue Wed Thu Fri Sat   13 2 mg 1 mg 2 mg 2 mg 2 mg 2 mg 2 mg    (2 mg1) (2 mg0.5) (2 mg1) (2 mg1) (2 mg1) (2 mg1) (2 mg1)

## 2012-05-26 ENCOUNTER — Ambulatory Visit (INDEPENDENT_AMBULATORY_CARE_PROVIDER_SITE_OTHER): Payer: Medicare Other | Admitting: Family

## 2012-05-26 DIAGNOSIS — Z7901 Long term (current) use of anticoagulants: Secondary | ICD-10-CM

## 2012-05-26 DIAGNOSIS — I4891 Unspecified atrial fibrillation: Secondary | ICD-10-CM

## 2012-05-26 LAB — POCT INR: INR: 2.3

## 2012-05-26 NOTE — Patient Instructions (Signed)
Continue same dose,1 mg only on mondays only,  And 2mg all other days, check in 6 weeks.    Latest dosing instructions   Total Sun Mon Tue Wed Thu Fri Sat   13 2 mg 1 mg 2 mg 2 mg 2 mg 2 mg 2 mg    (2 mg1) (2 mg0.5) (2 mg1) (2 mg1) (2 mg1) (2 mg1) (2 mg1)        

## 2012-06-27 ENCOUNTER — Encounter: Payer: Medicare Other | Admitting: Internal Medicine

## 2012-07-01 ENCOUNTER — Other Ambulatory Visit: Payer: Self-pay | Admitting: Internal Medicine

## 2012-07-07 ENCOUNTER — Ambulatory Visit (INDEPENDENT_AMBULATORY_CARE_PROVIDER_SITE_OTHER): Payer: Medicare Other | Admitting: Family

## 2012-07-07 DIAGNOSIS — I4891 Unspecified atrial fibrillation: Secondary | ICD-10-CM

## 2012-07-07 DIAGNOSIS — Z7901 Long term (current) use of anticoagulants: Secondary | ICD-10-CM

## 2012-07-07 LAB — POCT INR: INR: 2.6

## 2012-07-07 NOTE — Patient Instructions (Addendum)
Continue same dose,1 mg only on mondays only,  And 2mg  all other days, check in 6 weeks.    Latest dosing instructions   Total Sun Mon Tue Wed Thu Fri Sat   13 2 mg 1 mg 2 mg 2 mg 2 mg 2 mg 2 mg    (2 mg1) (2 mg0.5) (2 mg1) (2 mg1) (2 mg1) (2 mg1) (2 mg1)

## 2012-07-22 ENCOUNTER — Other Ambulatory Visit: Payer: Self-pay | Admitting: Internal Medicine

## 2012-07-24 ENCOUNTER — Encounter: Payer: Self-pay | Admitting: Internal Medicine

## 2012-07-24 ENCOUNTER — Ambulatory Visit (INDEPENDENT_AMBULATORY_CARE_PROVIDER_SITE_OTHER): Payer: Medicare Other | Admitting: Internal Medicine

## 2012-07-24 VITALS — BP 122/80 | HR 72 | Temp 97.8°F | Resp 20 | Ht 60.5 in | Wt 166.0 lb

## 2012-07-24 DIAGNOSIS — I10 Essential (primary) hypertension: Secondary | ICD-10-CM

## 2012-07-24 DIAGNOSIS — I4891 Unspecified atrial fibrillation: Secondary | ICD-10-CM

## 2012-07-24 DIAGNOSIS — Z0000 Encounter for general adult medical examination without abnormal findings: Secondary | ICD-10-CM

## 2012-07-24 DIAGNOSIS — I872 Venous insufficiency (chronic) (peripheral): Secondary | ICD-10-CM

## 2012-07-24 DIAGNOSIS — Z7901 Long term (current) use of anticoagulants: Secondary | ICD-10-CM

## 2012-07-24 LAB — CBC WITH DIFFERENTIAL/PLATELET
Basophils Absolute: 0.1 10*3/uL (ref 0.0–0.1)
Eosinophils Relative: 3.5 % (ref 0.0–5.0)
HCT: 43.8 % (ref 36.0–46.0)
Hemoglobin: 14.4 g/dL (ref 12.0–15.0)
Lymphs Abs: 2.4 10*3/uL (ref 0.7–4.0)
MCV: 96.8 fl (ref 78.0–100.0)
Monocytes Absolute: 0.6 10*3/uL (ref 0.1–1.0)
Neutro Abs: 3.3 10*3/uL (ref 1.4–7.7)
Platelets: 196 10*3/uL (ref 150.0–400.0)
RDW: 15.1 % — ABNORMAL HIGH (ref 11.5–14.6)

## 2012-07-24 LAB — COMPREHENSIVE METABOLIC PANEL
ALT: 19 U/L (ref 0–35)
AST: 31 U/L (ref 0–37)
Albumin: 3.6 g/dL (ref 3.5–5.2)
Alkaline Phosphatase: 77 U/L (ref 39–117)
BUN: 24 mg/dL — ABNORMAL HIGH (ref 6–23)
Potassium: 3.3 mEq/L — ABNORMAL LOW (ref 3.5–5.1)
Sodium: 144 mEq/L (ref 135–145)

## 2012-07-24 LAB — TSH: TSH: 2.04 u[IU]/mL (ref 0.35–5.50)

## 2012-07-24 MED ORDER — ATENOLOL 25 MG PO TABS: 25.0000 mg | ORAL_TABLET | Freq: Every day | ORAL | Status: DC

## 2012-07-24 MED ORDER — DILTIAZEM HCL ER COATED BEADS 120 MG PO CP24: 120.0000 mg | ORAL_CAPSULE | Freq: Every day | ORAL | Status: DC

## 2012-07-24 MED ORDER — POTASSIUM CHLORIDE CRYS ER 20 MEQ PO TBCR: 20.0000 meq | EXTENDED_RELEASE_TABLET | Freq: Every day | ORAL | Status: DC

## 2012-07-24 MED ORDER — FUROSEMIDE 40 MG PO TABS: 40.0000 mg | ORAL_TABLET | Freq: Two times a day (BID) | ORAL | Status: DC

## 2012-07-24 NOTE — Patient Instructions (Signed)
Limit your sodium (Salt) intake    It is important that you exercise regularly, at least 20 minutes 3 to 4 times per week.  If you develop chest pain or shortness of breath seek  medical attention.  Return in 6 months for follow-up  

## 2012-07-24 NOTE — Progress Notes (Signed)
Subjective:    Patient ID: Bonnie Roberts, female    DOB: 24-Jun-1922, 78 y.o.   MRN: [id number]  HPI  History of Present Illness:   78 year-old patient who is seen today for follow-up. She has a history of chronic atrial fibrillation on rate control and anticoagulation. Coumadin therapy. She has DJD and a history of well-controlled hypertension. She does remarkably well. She denies any cardiopulmonary complaints. She has had a recent cardiology visit. Her most recent INR was 2.2. She has mild chronic venous insufficiency and edema   Allergies:   1) ! Pcn  2) ! Sulfa  3) ! Nsaids  4) ! Codeine   Past History:  Past Medical History:  Reviewed history from 03/13/2009 and no changes required.   ATRIAL FIBRILLATION (ICD-427.31)  HYPERTENSION (ICD-401.9)  EDEMA (ICD-782.3)  DYSPNEA (ICD-786.05)  HEMORRHOID, EXTERNAL, THROMBOSED (ICD-455.4)  DISORDER, MENOPAUSAL NOS (ICD-627.9)  OSTEOARTHRITIS (ICD-715.90)  DEGENERATIVE JOINT DISEASE (ICD-715.90)  COLONIC POLYPS, HX OF (ICD-V12.72)   Past Surgical History:  Reviewed history from 04/10/2007 and no changes required.   Carpal tunnel release-right  Cholecystectomy-1967  Hysterectomy-1959  Total knee replacement-right-2003  Rectocele/bladder tacking-1996  hernia repair-left  G 4 P 4 AB 0  Heart cath-2006  Colonoscopy-05/2004  Past Medical History  Diagnosis Date  . ANXIETY DEPRESSION 12/11/2009  . Atrial fibrillation 04/10/2007  . COLONIC POLYPS, HX OF 04/10/2007  . DISORDER, MENOPAUSAL NOS 05/15/2007  . DYSPNEA 02/11/2009  . Edema 03/13/2009  . HYPERTENSION 04/10/2007  . Osteoarth NOS-Unspec 04/10/2007  . VENOUS INSUFFICIENCY, CHRONIC 01/22/2010    History   Social History  . Marital Status: Widowed    Spouse Name: N/A    Number of Children: N/A  . Years of Education: N/A   Occupational History  . Not on file.   Social History Main Topics  . Smoking status: Never Smoker   . Smokeless tobacco: Never Used  . Alcohol Use:  No  . Drug Use: No  . Sexually Active: Not on file   Other Topics Concern  . Not on file   Social History Narrative  . No narrative on file    Past Surgical History  Procedure Date  . Cholecystectomy   . Carpal tunnel release   . Abdominal hysterectomy   . Total knee arthroplasty     right  . Rectocele repair   . Bladder surgery     tacking  . Hernia repair   . Cardiac catheterization     No family history on file.  Allergies  Allergen Reactions  . Codeine     REACTION: n/v  . Nsaids     REACTION: gib  . Penicillins     REACTION: rash  . Sulfonamide Derivatives     REACTION: rash    Current Outpatient Prescriptions on File Prior to Visit  Medication Sig Dispense Refill  . atenolol (TENORMIN) 25 MG tablet Take 1 tablet (25 mg total) by mouth daily.  90 tablet  3  . b complex vitamins capsule Take 1 capsule by mouth daily.        Marland Kitchen diltiazem (CARDIZEM CD) 120 MG 24 hr capsule Take 1 capsule (120 mg total) by mouth daily.  90 capsule  3  . docusate sodium (COLACE) 100 MG capsule Take 100 mg by mouth 2 (two) times daily.        . Flaxseed, Linseed, (BL FLAX SEED OIL) 1000 MG CAPS Take by mouth daily.        . furosemide (LASIX)  40 MG tablet Take 1 tablet (40 mg total) by mouth 2 (two) times daily.  180 tablet  3  . KLOR-CON M20 20 MEQ tablet TAKE 1 TABLET BY MOUTH TWICE A DAY  180 each  3  . Multiple Vitamins-Minerals (CENTRUM SILVER PO) Take by mouth daily.        . Omega-3 Fatty Acids (FISH OIL) 1000 MG CAPS Take by mouth daily.        Marland Kitchen warfarin (COUMADIN) 2 MG tablet TAKE AS DIRECTED BY COUMADIN CLINIC.  90 tablet  0    BP 122/80  Pulse 72  Temp 97.8 F (36.6 C) (Oral)  Resp 20  Ht 5' 0.5" (1.537 m)  Wt 166 lb (75.297 kg)  BMI 31.89 kg/m2  SpO2 97%     1. Risk factors, based on past  M,S,F history- cardiovascular risk factors include hypertension and age. She has chronic atrial fibrillation.  2.  Physical activities: Fairly active for 78 years of  age does have some arthritis limiting activity  3.  Depression/mood: No history depression or mood disorder  4.  Hearing: Moderately severe deficits. Has recently purchased her in a  5.  ADL's: Independent in all aspects of daily living. Cares for a disabled daughter  22.  Fall risk: Moderate due to age and unsteadiness 7.  Home safety: No problems identified  8.  Height weight, and visual acuity; height and weight stable no change in visual acuity  9.  Counseling: Modest weight loss encouraged calcium and vitamin D supplements encouraged  10. Lab orders based on risk factors: Laboratory update will be reviewed  11. Referral : Followup cardiology  12. Care plan: Follow Coumadin clinic  13. Cognitive assessment: Alert and oriented with normal affect. No cognitive dysfunction     Review of Systems  Constitutional: Negative for fever, appetite change, fatigue and unexpected weight change.  HENT: Negative for hearing loss, ear pain, nosebleeds, congestion, sore throat, mouth sores, trouble swallowing, neck stiffness, dental problem, voice change, sinus pressure and tinnitus.   Eyes: Negative for photophobia, pain, redness and visual disturbance.  Respiratory: Negative for cough, chest tightness and shortness of breath.   Cardiovascular: Positive for leg swelling. Negative for chest pain and palpitations.  Gastrointestinal: Negative for nausea, vomiting, abdominal pain, diarrhea, constipation, blood in stool, abdominal distention and rectal pain.  Genitourinary: Negative for dysuria, urgency, frequency, hematuria, flank pain, vaginal bleeding, vaginal discharge, difficulty urinating, genital sores, vaginal pain, menstrual problem and pelvic pain.  Musculoskeletal: Positive for back pain and arthralgias.  Skin: Negative for rash.  Neurological: Negative for dizziness, syncope, speech difficulty, weakness, light-headedness, numbness and headaches.  Hematological: Negative for adenopathy.  Does not bruise/bleed easily.  Psychiatric/Behavioral: Negative for suicidal ideas, behavioral problems, self-injury, dysphoric mood and agitation. The patient is not nervous/anxious.        Objective:   Physical Exam  Constitutional: She is oriented to person, place, and time. She appears well-developed and well-nourished.  HENT:  Head: Normocephalic and atraumatic.  Right Ear: External ear normal.  Left Ear: External ear normal.  Mouth/Throat: Oropharynx is clear and moist.  Eyes: Conjunctivae normal and EOM are normal.  Neck: Normal range of motion. Neck supple. No JVD present. No thyromegaly present.  Cardiovascular: Normal rate, regular rhythm, normal heart sounds and intact distal pulses.   No murmur heard.      Dorsalis pedis pulses full. Posterior tibial pulses not easily palpable  Pulmonary/Chest: Effort normal and breath sounds normal. She has no wheezes.  She has no rales.  Abdominal: Soft. Bowel sounds are normal. She exhibits no distension and no mass. There is no tenderness. There is no rebound and no guarding.  Musculoskeletal: Normal range of motion. She exhibits no edema and no tenderness.       Left ankle edema  Chronic varicosities  Neurological: She is alert and oriented to person, place, and time. She has normal reflexes. No cranial nerve deficit. She exhibits normal muscle tone. Coordination normal.  Skin: Skin is warm and dry. No rash noted.  Psychiatric: She has a normal mood and affect. Her behavior is normal.          Assessment & Plan:   Preventive health examination Chronic atrial fibrillation. We'll continue rate control and chronic Coumadin anticoagulation Hypertension stable Chronic venous insufficiency

## 2012-07-25 LAB — LDL CHOLESTEROL, DIRECT: Direct LDL: 162.3 mg/dL

## 2012-07-25 LAB — LIPID PANEL
HDL: 48 mg/dL (ref >39.00)
VLDL: 14.4 mg/dL (ref 0.0–40.0)

## 2012-08-16 ENCOUNTER — Encounter: Payer: Self-pay | Admitting: Cardiovascular Disease

## 2012-08-16 ENCOUNTER — Ambulatory Visit (INDEPENDENT_AMBULATORY_CARE_PROVIDER_SITE_OTHER): Payer: Medicare Other | Admitting: Cardiovascular Disease

## 2012-08-16 VITALS — BP 150/92 | HR 75 | Ht 61.5 in | Wt 166.0 lb

## 2012-08-16 DIAGNOSIS — R079 Chest pain, unspecified: Secondary | ICD-10-CM

## 2012-08-16 DIAGNOSIS — I4891 Unspecified atrial fibrillation: Secondary | ICD-10-CM

## 2012-08-16 DIAGNOSIS — I872 Venous insufficiency (chronic) (peripheral): Secondary | ICD-10-CM

## 2012-08-16 DIAGNOSIS — I10 Essential (primary) hypertension: Secondary | ICD-10-CM

## 2012-08-16 NOTE — Patient Instructions (Signed)
Your physician wants you to follow-up in:   6 MONTHS WITH DR Haywood Filler will receive a reminder letter in the mail two months in advance. If you don't receive a letter, please call our office to schedule the follow-up appointment. Your physician recommends that you continue on your current medications as directed. Please refer to the Current Medication list given to you today. Your physician has requested that you have a lexiscan myoview. For further information please visit [email or web address] Please follow instruction sheet, as given. DX CHEST PAIN

## 2012-08-16 NOTE — Assessment & Plan Note (Signed)
Well controlled.  Continue current medications and low sodium Dash type diet.    

## 2012-08-16 NOTE — Addendum Note (Signed)
Addended by: Scherrie Bateman E on: 08/16/2012 08:59 AM   Modules accepted: Orders

## 2012-08-16 NOTE — Assessment & Plan Note (Signed)
Good rate control and anticoagulation. Despite age appears acceptable risk for anticoagulation. Stable on feet with cane and still driving

## 2012-08-16 NOTE — Progress Notes (Signed)
Patient ID: Bonnie Roberts, female   DOB: 1922-08-06, 78 y.o.   MRN: [id number] Bonnie Roberts was recently hospitalized for SOB and palpitations. She is getting some forgetful and anxious. She has chronic afib with good rate control and anticoagulation. INR was 2.3 today. She had a F/U echo which showed normal EF and only moderate TR. Chronic LE edema from venous insuficiency. No palpitaitons, SOB, or SSCP. No history of CAD. Still a good coumadin candidate despite age. Getting INR checke at Dr Charm Rings office as it is less expensive  Continues to have sscp bad episode 9/29  Some radiating to arm  Associated with some diaphoresis  ROS: Denies fever, malais, weight loss, blurry vision, decreased visual acuity, cough, sputum, SOB, hemoptysis, pleuritic pain, palpitaitons, heartburn, abdominal pain, melena, lower extremity edema, claudication, or rash.  All other systems reviewed and negative  General: Affect appropriate Healthy:  appears stated age HEENT: normal Neck supple with no adenopathy JVP normal no bruits no thyromegaly Lungs clear with no wheezing and good diaphragmatic motion Heart:  S1/S2 no murmur, no rub, gallop or click PMI normal Abdomen: benighn, BS positve, no tenderness, no AAA no bruit.  No HSM or HJR Distal pulses intact with no bruits Plus one LLE edema Neuro non-focal Skin warm and dry No muscular weakness   Current Outpatient Prescriptions  Medication Sig Dispense Refill  . atenolol (TENORMIN) 25 MG tablet Take 1 tablet (25 mg total) by mouth daily.  90 tablet  3  . b complex vitamins capsule Take 1 capsule by mouth daily.        Marland Kitchen diltiazem (CARDIZEM CD) 120 MG 24 hr capsule Take 1 capsule (120 mg total) by mouth daily.  90 capsule  3  . docusate sodium (COLACE) 100 MG capsule Take 100 mg by mouth 2 (two) times daily.        . Flaxseed, Linseed, (BL FLAX SEED OIL) 1000 MG CAPS Take by mouth daily.        . furosemide (LASIX) 40 MG tablet Take 1 tablet (40 mg total) by mouth 2  (two) times daily.  180 tablet  3  . Multiple Vitamins-Minerals (CENTRUM SILVER PO) Take by mouth daily.        . Omega-3 Fatty Acids (FISH OIL) 1000 MG CAPS Take by mouth daily.        Marland Kitchen warfarin (COUMADIN) 2 MG tablet TAKE AS DIRECTED BY COUMADIN CLINIC.  90 tablet  0  . potassium chloride SA (K-DUR,KLOR-CON) 20 MEQ tablet Take 20 mEq by mouth 2 (two) times daily.      . [DISCONTINUED] potassium chloride SA (KLOR-CON M20) 20 MEQ tablet Take 1 tablet (20 mEq total) by mouth daily.  90 tablet  4    Allergies  Codeine; Nsaids; Penicillins; and Sulfonamide derivatives  Electrocardiogram:  Afib rate 75 nonspecific T wave changes  Assessment and Plan

## 2012-08-16 NOTE — Assessment & Plan Note (Signed)
Stable needs better low sodium diet Continue current meds. Encouraged support hose on left

## 2012-08-16 NOTE — Assessment & Plan Note (Signed)
Atypical but recurrent Unable to walk on ETT  F/U lexiscan myovue

## 2012-08-18 ENCOUNTER — Ambulatory Visit (INDEPENDENT_AMBULATORY_CARE_PROVIDER_SITE_OTHER): Payer: Medicare Other | Admitting: Family

## 2012-08-18 DIAGNOSIS — Z7901 Long term (current) use of anticoagulants: Secondary | ICD-10-CM

## 2012-08-18 DIAGNOSIS — I4891 Unspecified atrial fibrillation: Secondary | ICD-10-CM

## 2012-08-18 LAB — POCT INR: INR: 2.5

## 2012-08-18 NOTE — Patient Instructions (Addendum)
Continue same dose,1 mg only on mondays only,  And 2mg all other days, check in 6 weeks.    Latest dosing instructions   Total Sun Mon Tue Wed Thu Fri Sat   13 2 mg 1 mg 2 mg 2 mg 2 mg 2 mg 2 mg    (2 mg1) (2 mg0.5) (2 mg1) (2 mg1) (2 mg1) (2 mg1) (2 mg1)        

## 2012-08-23 ENCOUNTER — Ambulatory Visit (HOSPITAL_COMMUNITY): Payer: Medicare Other | Attending: Cardiovascular Disease | Admitting: Radiology

## 2012-08-23 VITALS — BP 132/79 | HR 79 | Ht 61.0 in | Wt 148.0 lb

## 2012-08-23 DIAGNOSIS — I2510 Atherosclerotic heart disease of native coronary artery without angina pectoris: Secondary | ICD-10-CM

## 2012-08-23 DIAGNOSIS — I10 Essential (primary) hypertension: Secondary | ICD-10-CM | POA: Insufficient documentation

## 2012-08-23 DIAGNOSIS — I4891 Unspecified atrial fibrillation: Secondary | ICD-10-CM

## 2012-08-23 DIAGNOSIS — R079 Chest pain, unspecified: Secondary | ICD-10-CM

## 2012-08-23 DIAGNOSIS — R002 Palpitations: Secondary | ICD-10-CM | POA: Insufficient documentation

## 2012-08-23 DIAGNOSIS — R000 Tachycardia, unspecified: Secondary | ICD-10-CM | POA: Insufficient documentation

## 2012-08-23 DIAGNOSIS — Z87891 Personal history of nicotine dependence: Secondary | ICD-10-CM | POA: Insufficient documentation

## 2012-08-23 DIAGNOSIS — R0609 Other forms of dyspnea: Secondary | ICD-10-CM | POA: Insufficient documentation

## 2012-08-23 DIAGNOSIS — R61 Generalized hyperhidrosis: Secondary | ICD-10-CM | POA: Insufficient documentation

## 2012-08-23 DIAGNOSIS — R0789 Other chest pain: Secondary | ICD-10-CM | POA: Insufficient documentation

## 2012-08-23 DIAGNOSIS — R0989 Other specified symptoms and signs involving the circulatory and respiratory systems: Secondary | ICD-10-CM | POA: Insufficient documentation

## 2012-08-23 MED ORDER — TECHNETIUM TC 99M SESTAMIBI GENERIC - CARDIOLITE
10.0000 | Freq: Once | INTRAVENOUS | Status: AC | PRN
  Administered 2012-08-23: 10 via INTRAVENOUS

## 2012-08-23 MED ORDER — REGADENOSON 0.4 MG/5ML IV SOLN
0.4000 mg | Freq: Once | INTRAVENOUS | Status: AC
  Administered 2012-08-23: 0.4 mg via INTRAVENOUS

## 2012-08-23 MED ORDER — TECHNETIUM TC 99M SESTAMIBI GENERIC - CARDIOLITE
30.0000 | Freq: Once | INTRAVENOUS | Status: AC | PRN
  Administered 2012-08-23: 30 via INTRAVENOUS

## 2012-08-23 NOTE — Progress Notes (Signed)
[facility] SITE 3 NUCLEAR MED [id number] [place] [address] [phone]  Cardiology Nuclear Med Study  Bonnie Roberts is a 78 y.o. female     MRN : [id number]     DOB: April 13, 1922  Procedure Date: 08/23/2012  Nuclear Med Background Indication for Stress Test:  Evaluation for Ischemia History:  '06 GEX:BMWUXL, EF>70%>Cath:Minimal CAD, EF=60%; '11 Echo:EF=65%. Cardiac Risk Factors: History of Smoking and Hypertension  Symptoms:  Chest Pain (last episode of chest discomfort was about a week ago), Diaphoresis, DOE, Palpitations and Rapid HR   Nuclear Pre-Procedure Caffeine/Decaff Intake:  None NPO After: 500pm   Lungs:  Clear. O2 Sat: 98% on room air. IV 0.9% NS with Angio Cath:  22g  IV Site: R Antecubital  IV Started by:  Milana Na, EMT-P  Chest Size (in):  42 Cup Size: D  Height: 5\' 1"  (1.549 m)  Weight:  148 lb (67.132 kg)  BMI:  Body mass index is 27.96 kg/(m^2). Tech Comments:  Held atenolol and lasix this am    Nuclear Med Study 1 or 2 day study: 1 day  Stress Test Type:  Lexiscan  Reading MD: Kristeen Miss, MD  Order Authorizing Provider:  Charlton Haws, MD  Resting Radionuclide: Technetium 50m Sestamibi  Resting Radionuclide Dose: 11.0 mCi   Stress Radionuclide:  Technetium 31m Sestamibi  Stress Radionuclide Dose: 33.0 mCi           Stress Protocol Rest HR: 67 Stress HR: 85  Rest BP: 132/79 Stress BP: 138/78  Exercise Time (min): n/a METS: n/a   Predicted Max HR: 133 bpm % Max HR: 63.91 bpm Rate Pressure Product: 24401   Dose of Adenosine (mg):  n/a Dose of Lexiscan: 0.4 mg  Dose of Atropine (mg): n/a Dose of Dobutamine: n/a mcg/kg/min (at max HR)  Stress Test Technologist: Smiley Houseman, CMA-N  Nuclear Technologist:  Domenic Polite, CNMT     Rest Procedure:  Myocardial perfusion imaging was performed at rest 45 minutes following the intravenous administration of Technetium 106m Sestamibi.  Rest ECG: Atrial  Fibrillation with occasional 2-second pauses.  Stress Procedure:  The patient received IV Lexiscan 0.4 mg over 15-seconds.  Technetium 55m Sestamibi was injected at 30-seconds.  There were no significant changes with Lexiscan, occasional PVC's and 2-second pauses were noted.  Quantitative spect images were obtained after a 45 minute delay.  Stress ECG: No significant change from baseline ECG  QPS Raw Data Images:  Normal; no motion artifact; normal heart/lung ratio. Stress Images:  There is a very small but moderately severe defect in the apex with normal uptake in the other regions.    SSS = 6 Rest Images:  There is a very small very mild defect in the apex with normal uptake in the other regions.  SRS = 2.  Subtraction (SDS):  There is a very small are of redistribution at the apex with normal perfusion in the other arease.  SDS = 4. Transient Ischemic Dilatation (Normal <1.22):  1.08 Lung/Heart Ratio (Normal <0.45):  0.34  Quantitative Gated Spect Images QGS EDV:  n/a ml QGS ESV:  n/a ml  Impression Exercise Capacity:  Lexiscan with no exercise. BP Response:  Normal blood pressure response. Clinical Symptoms:  No significant symptoms noted. ECG Impression:  No significant ST segment change suggestive of ischemia. Comparison with Prior Nuclear Study: No images to compare  Overall Impression:  Low risk stress nuclear study.  There is a very small area of reversible  ischemia at the apex ( SDS = 4).  There perfusion in the other areas is normal.   LV Ejection Fraction: Study not gated.      Vesta Mixer, Montez Hageman., MD, [facility] 08/23/2012, 5:30 PM Office - [phone] Pager [phone]

## 2012-08-24 NOTE — Addendum Note (Signed)
Addended by: Domenic Polite on: 08/24/2012 09:02 AM   Modules accepted: Orders

## 2012-09-11 ENCOUNTER — Ambulatory Visit (INDEPENDENT_AMBULATORY_CARE_PROVIDER_SITE_OTHER): Payer: Medicare Other | Admitting: Internal Medicine

## 2012-09-11 ENCOUNTER — Encounter: Payer: Self-pay | Admitting: Internal Medicine

## 2012-09-11 VITALS — BP 150/80 | HR 68 | Temp 98.0°F | Resp 18 | Wt 159.0 lb

## 2012-09-11 DIAGNOSIS — Z7901 Long term (current) use of anticoagulants: Secondary | ICD-10-CM

## 2012-09-11 DIAGNOSIS — I4891 Unspecified atrial fibrillation: Secondary | ICD-10-CM

## 2012-09-11 DIAGNOSIS — M549 Dorsalgia, unspecified: Secondary | ICD-10-CM

## 2012-09-11 DIAGNOSIS — M1990 Unspecified osteoarthritis, unspecified site: Secondary | ICD-10-CM

## 2012-09-11 MED ORDER — PREDNISONE 10 MG PO TABS: 10.0000 mg | ORAL_TABLET | Freq: Every day | ORAL | Status: DC

## 2012-09-11 MED ORDER — TRAMADOL HCL 50 MG PO TABS: 50.0000 mg | ORAL_TABLET | Freq: Three times a day (TID) | ORAL | Status: DC | PRN

## 2012-09-11 NOTE — Addendum Note (Signed)
Addended by: Jimmye Norman on: 09/11/2012 04:25 PM   Modules accepted: Orders

## 2012-09-11 NOTE — Progress Notes (Signed)
Subjective:    Patient ID: Bonnie Roberts, female    DOB: Jun 02, 1922, 78 y.o.   MRN: [id number]  HPI  78 year old patient who has a history of osteoarthritis. She also has atrial fibrillation and has been on chronic Coumadin anticoagulation. The past few weeks she has had some pain mainly in the right lumbar back area. This has intensified over the past couple of weeks after doing some more vigorous activities. No radicular symptoms.  Past Medical History  Diagnosis Date  . ANXIETY DEPRESSION 12/11/2009  . Atrial fibrillation 04/10/2007  . COLONIC POLYPS, HX OF 04/10/2007  . DISORDER, MENOPAUSAL NOS 05/15/2007  . DYSPNEA 02/11/2009  . Edema 03/13/2009  . HYPERTENSION 04/10/2007  . Osteoarth NOS-Unspec 04/10/2007  . VENOUS INSUFFICIENCY, CHRONIC 01/22/2010    History   Social History  . Marital Status: Widowed    Spouse Name: N/A    Number of Children: N/A  . Years of Education: N/A   Occupational History  . Not on file.   Social History Main Topics  . Smoking status: Never Smoker   . Smokeless tobacco: Never Used  . Alcohol Use: No  . Drug Use: No  . Sexually Active: Not on file   Other Topics Concern  . Not on file   Social History Narrative  . No narrative on file    Past Surgical History  Procedure Date  . Cholecystectomy   . Carpal tunnel release   . Abdominal hysterectomy   . Total knee arthroplasty     right  . Rectocele repair   . Bladder surgery     tacking  . Hernia repair   . Cardiac catheterization     No family history on file.  Allergies  Allergen Reactions  . Codeine     REACTION: n/v  . Nsaids     REACTION: gib  . Penicillins     REACTION: rash  . Sulfonamide Derivatives     REACTION: rash    Current Outpatient Prescriptions on File Prior to Visit  Medication Sig Dispense Refill  . atenolol (TENORMIN) 25 MG tablet Take 1 tablet (25 mg total) by mouth daily.  90 tablet  3  . b complex vitamins capsule Take 1 capsule by mouth daily.          Marland Kitchen diltiazem (CARDIZEM CD) 120 MG 24 hr capsule Take 1 capsule (120 mg total) by mouth daily.  90 capsule  3  . docusate sodium (COLACE) 100 MG capsule Take 100 mg by mouth 2 (two) times daily.        . Flaxseed, Linseed, (BL FLAX SEED OIL) 1000 MG CAPS Take by mouth daily.        . furosemide (LASIX) 40 MG tablet Take 1 tablet (40 mg total) by mouth 2 (two) times daily.  180 tablet  3  . Multiple Vitamins-Minerals (CENTRUM SILVER PO) Take by mouth daily.        . Omega-3 Fatty Acids (FISH OIL) 1000 MG CAPS Take by mouth daily.        . potassium chloride SA (K-DUR,KLOR-CON) 20 MEQ tablet Take 20 mEq by mouth 2 (two) times daily.      Marland Kitchen warfarin (COUMADIN) 2 MG tablet TAKE AS DIRECTED BY COUMADIN CLINIC.  90 tablet  0    BP 150/80  Pulse 68  Temp 98 F (36.7 C) (Oral)  Resp 18  Wt 159 lb (72.122 kg)  SpO2 95%       Review of Systems  Constitutional: Negative.   HENT: Negative for hearing loss, congestion, sore throat, rhinorrhea, dental problem, sinus pressure and tinnitus.   Eyes: Negative for pain, discharge and visual disturbance.  Respiratory: Negative for cough and shortness of breath.   Cardiovascular: Negative for chest pain, palpitations and leg swelling.  Gastrointestinal: Negative for nausea, vomiting, abdominal pain, diarrhea, constipation, blood in stool and abdominal distention.  Genitourinary: Negative for dysuria, urgency, frequency, hematuria, flank pain, vaginal bleeding, vaginal discharge, difficulty urinating, vaginal pain and pelvic pain.  Musculoskeletal: Positive for back pain. Negative for joint swelling, arthralgias and gait problem.  Skin: Negative for rash.  Neurological: Negative for dizziness, syncope, speech difficulty, weakness, numbness and headaches.  Hematological: Negative for adenopathy.  Psychiatric/Behavioral: Negative for behavioral problems, dysphoric mood and agitation. The patient is not nervous/anxious.        Objective:   Physical  Exam  Constitutional: She is oriented to person, place, and time. She appears well-developed and well-nourished.  HENT:  Head: Normocephalic.  Right Ear: External ear normal.  Left Ear: External ear normal.  Mouth/Throat: Oropharynx is clear and moist.  Eyes: Conjunctivae normal and EOM are normal. Pupils are equal, round, and reactive to light.  Neck: Normal range of motion. Neck supple. No thyromegaly present.  Cardiovascular: Normal rate, regular rhythm, normal heart sounds and intact distal pulses.   Pulmonary/Chest: Effort normal and breath sounds normal.  Abdominal: Soft. Bowel sounds are normal. She exhibits no mass. There is no tenderness.  Musculoskeletal: Normal range of motion.       Slight tenderness in the right lumbar paravertebral area  Decreased range of motion right hip with some discomfort  Motor intact  Ambulatory without a limp  Lymphadenopathy:    She has no cervical adenopathy.  Neurological: She is alert and oriented to person, place, and time.  Skin: Skin is warm and dry. No rash noted.  Psychiatric: She has a normal mood and affect. Her behavior is normal.          Assessment & Plan:   Low back pain Osteoarthritis Chronic Coumadin anticoagulation  The patient treated with prednisone 10 mg twice daily for 7 days. She will also be treated with Tylenol and if needed tramadol. She will call if unimproved

## 2012-09-11 NOTE — Patient Instructions (Signed)
You  may move around, but avoid painful motions and activities.  Apply heat  to the sore area for 15 to 20 minutes 3 or 4 times daily for the next two to 3 days.  Take [phone] mg of Tylenol every 6 hours as needed for pain relief or fever.  Avoid taking more than 3000 mg in a 24-hour period (  This may cause liver damage)

## 2012-09-18 LAB — HM MAMMOGRAPHY

## 2012-09-21 ENCOUNTER — Encounter: Payer: Self-pay | Admitting: Internal Medicine

## 2012-09-28 ENCOUNTER — Ambulatory Visit (INDEPENDENT_AMBULATORY_CARE_PROVIDER_SITE_OTHER): Payer: Medicare Other | Admitting: Internal Medicine

## 2012-09-28 ENCOUNTER — Ambulatory Visit (INDEPENDENT_AMBULATORY_CARE_PROVIDER_SITE_OTHER): Payer: Medicare Other | Admitting: Family

## 2012-09-28 ENCOUNTER — Encounter: Payer: Self-pay | Admitting: Internal Medicine

## 2012-09-28 VITALS — BP 152/90 | HR 88 | Temp 98.0°F | Resp 20 | Wt 169.0 lb

## 2012-09-28 DIAGNOSIS — M1990 Unspecified osteoarthritis, unspecified site: Secondary | ICD-10-CM

## 2012-09-28 DIAGNOSIS — I10 Essential (primary) hypertension: Secondary | ICD-10-CM

## 2012-09-28 DIAGNOSIS — I4891 Unspecified atrial fibrillation: Secondary | ICD-10-CM

## 2012-09-28 DIAGNOSIS — Z7901 Long term (current) use of anticoagulants: Secondary | ICD-10-CM

## 2012-09-28 LAB — POCT INR: INR: 4.8

## 2012-09-28 MED ORDER — PREDNISONE 10 MG PO TABS: 10.0000 mg | ORAL_TABLET | Freq: Every day | ORAL | Status: DC

## 2012-09-28 NOTE — Patient Instructions (Addendum)
Hold Friday and Saturday only. Then continue with current dose. Recheck in 10 days    Latest dosing instructions   Total Glynis Smiles Tue Wed Thu Fri Sat   13 2 mg 1 mg 2 mg 2 mg 2 mg 2 mg 2 mg    (2 mg1) (2 mg0.5) (2 mg1) (2 mg1) (2 mg1) (2 mg1) (2 mg1)

## 2012-09-28 NOTE — Progress Notes (Signed)
Subjective:    Patient ID: Bonnie Roberts, female    DOB: [birth year], 78 y.o.   MRN: [id number]  HPI  78 year old patient who is seen today complaining of persistent right hip pain. She was seen recently and treated with the low dose prednisone for 7 days. After 2 days she noted marked improvement but only persisted several days after completion of 7 days of therapy. She has not used any, on or tramadol. She wishes to consider more prednisone to assist her symptoms through the holidays. She is on Coumadin anticoagulation and INR was supratherapeutic and Coumadin therapy presently on hold. She is scheduled for followup in 10 days  Past Medical History  Diagnosis Date  . ANXIETY DEPRESSION 12/11/2009  . Atrial fibrillation 04/10/2007  . COLONIC POLYPS, HX OF 04/10/2007  . DISORDER, MENOPAUSAL NOS 05/15/2007  . DYSPNEA 02/11/2009  . Edema 03/13/2009  . HYPERTENSION 04/10/2007  . Osteoarth NOS-Unspec 04/10/2007  . VENOUS INSUFFICIENCY, CHRONIC 01/22/2010    History   Social History  . Marital Status: Widowed    Spouse Name: N/A    Number of Children: N/A  . Years of Education: N/A   Occupational History  . Not on file.   Social History Main Topics  . Smoking status: Never Smoker   . Smokeless tobacco: Never Used  . Alcohol Use: No  . Drug Use: No  . Sexually Active: Not on file   Other Topics Concern  . Not on file   Social History Narrative  . No narrative on file    Past Surgical History  Procedure Date  . Cholecystectomy   . Carpal tunnel release   . Abdominal hysterectomy   . Total knee arthroplasty     right  . Rectocele repair   . Bladder surgery     tacking  . Hernia repair   . Cardiac catheterization     No family history on file.  Allergies  Allergen Reactions  . Codeine     REACTION: n/v  . Nsaids     REACTION: gib  . Penicillins     REACTION: rash  . Sulfonamide Derivatives     REACTION: rash    Current Outpatient Prescriptions on File Prior to  Visit  Medication Sig Dispense Refill  . atenolol (TENORMIN) 25 MG tablet Take 1 tablet (25 mg total) by mouth daily.  90 tablet  3  . b complex vitamins capsule Take 1 capsule by mouth daily.        Marland Kitchen diltiazem (CARDIZEM CD) 120 MG 24 hr capsule Take 1 capsule (120 mg total) by mouth daily.  90 capsule  3  . docusate sodium (COLACE) 100 MG capsule Take 100 mg by mouth 2 (two) times daily.        . Flaxseed, Linseed, (BL FLAX SEED OIL) 1000 MG CAPS Take by mouth daily.        . furosemide (LASIX) 40 MG tablet Take 1 tablet (40 mg total) by mouth 2 (two) times daily.  180 tablet  3  . Multiple Vitamins-Minerals (CENTRUM SILVER PO) Take by mouth daily.        . Omega-3 Fatty Acids (FISH OIL) 1000 MG CAPS Take by mouth daily.        . potassium chloride SA (K-DUR,KLOR-CON) 20 MEQ tablet Take 20 mEq by mouth 2 (two) times daily.      . traMADol (ULTRAM) 50 MG tablet Take 1 tablet (50 mg total) by mouth every 8 (eight) hours as needed  for pain.  30 tablet  0  . warfarin (COUMADIN) 2 MG tablet TAKE AS DIRECTED BY COUMADIN CLINIC.  90 tablet  0  . predniSONE (DELTASONE) 10 MG tablet Take 1 tablet (10 mg total) by mouth daily.  14 tablet  0    BP 152/90  Pulse 88  Temp 98 F (36.7 C) (Oral)  Resp 20  Wt 169 lb (76.658 kg)  SpO2 97%       Review of Systems  Constitutional: Negative.   HENT: Negative for hearing loss, congestion, sore throat, rhinorrhea, dental problem, sinus pressure and tinnitus.   Eyes: Negative for pain, discharge and visual disturbance.  Respiratory: Negative for cough and shortness of breath.   Cardiovascular: Negative for chest pain, palpitations and leg swelling.  Gastrointestinal: Negative for nausea, vomiting, abdominal pain, diarrhea, constipation, blood in stool and abdominal distention.  Genitourinary: Negative for dysuria, urgency, frequency, hematuria, flank pain, vaginal bleeding, vaginal discharge, difficulty urinating, vaginal pain and pelvic pain.   Musculoskeletal: Positive for gait problem. Negative for joint swelling and arthralgias.  Skin: Negative for rash.  Neurological: Negative for dizziness, syncope, speech difficulty, weakness, numbness and headaches.  Hematological: Negative for adenopathy.  Psychiatric/Behavioral: Negative for behavioral problems, dysphoric mood and agitation. The patient is not nervous/anxious.        Objective:   Physical Exam  Constitutional: She appears well-developed and well-nourished. No distress.  Musculoskeletal:       Walks with a slight limp Range of motion the right hip decreased and maneuver painful          Assessment & Plan:   Right hip pain secondary to osteoarthritis. We'll retreat with low dose prednisone for 7 days. Patient had a nice but short-term benefit. She is aware that long-term prednisone use would not be appropriate. She will try 3 times a day acetaminophen and use Tylenol when necessary.

## 2012-09-28 NOTE — Patient Instructions (Signed)
  Prednisone twice a day as directed  Take [phone] mg of Tylenol every 6 hours as needed for pain relief or fever.  Avoid taking more than 3000 mg in a 24-hour period (  This may cause liver damage).

## 2012-10-09 ENCOUNTER — Ambulatory Visit (INDEPENDENT_AMBULATORY_CARE_PROVIDER_SITE_OTHER): Payer: Medicare Other | Admitting: Family

## 2012-10-09 DIAGNOSIS — I4891 Unspecified atrial fibrillation: Secondary | ICD-10-CM

## 2012-10-09 DIAGNOSIS — Z7901 Long term (current) use of anticoagulants: Secondary | ICD-10-CM

## 2012-10-09 LAB — POCT INR: INR: 2.8

## 2012-10-09 NOTE — Patient Instructions (Addendum)
Eat more greens, Continue with current dose. Recheck in 3 weeks.     Latest dosing instructions   Total Sun Mon Tue Wed Thu Fri Sat   13 2 mg 1 mg 2 mg 2 mg 2 mg 2 mg 2 mg    (2 mg1) (2 mg0.5) (2 mg1) (2 mg1) (2 mg1) (2 mg1) (2 mg1)

## 2012-10-30 ENCOUNTER — Other Ambulatory Visit: Payer: Self-pay | Admitting: Family

## 2012-10-31 ENCOUNTER — Ambulatory Visit (INDEPENDENT_AMBULATORY_CARE_PROVIDER_SITE_OTHER): Payer: Medicare Other | Admitting: Family

## 2012-10-31 DIAGNOSIS — I4891 Unspecified atrial fibrillation: Secondary | ICD-10-CM

## 2012-10-31 DIAGNOSIS — Z7901 Long term (current) use of anticoagulants: Secondary | ICD-10-CM

## 2012-10-31 LAB — POCT INR: INR: 2.8

## 2012-10-31 NOTE — Patient Instructions (Addendum)
Eat more greens, Continue with current dose. Recheck in 4 weeks.     Latest dosing instructions   Total Sun Mon Tue Wed Thu Fri Sat   13 2 mg 1 mg 2 mg 2 mg 2 mg 2 mg 2 mg    (2 mg1) (2 mg0.5) (2 mg1) (2 mg1) (2 mg1) (2 mg1) (2 mg1)

## 2012-11-21 ENCOUNTER — Ambulatory Visit: Payer: Medicare Other | Admitting: Internal Medicine

## 2012-11-28 ENCOUNTER — Ambulatory Visit (INDEPENDENT_AMBULATORY_CARE_PROVIDER_SITE_OTHER): Payer: Medicare Other | Admitting: Family

## 2012-11-28 ENCOUNTER — Encounter: Payer: Medicare Other | Admitting: Family

## 2012-11-28 DIAGNOSIS — I4891 Unspecified atrial fibrillation: Secondary | ICD-10-CM

## 2012-11-28 DIAGNOSIS — Z7901 Long term (current) use of anticoagulants: Secondary | ICD-10-CM

## 2012-11-28 NOTE — Patient Instructions (Addendum)
Eat more greens, Continue with current dose. Recheck in 6 weeks.   Anticoagulation Dose Instructions as of 11/28/2012     Bonnie Roberts Tue Wed Thu Fri Sat   New Dose 2 mg 1 mg 2 mg 2 mg 2 mg 2 mg 2 mg    Description       Eat more greens, Continue with current dose. Recheck in 6 weeks.

## 2012-11-29 ENCOUNTER — Encounter: Payer: Medicare Other | Admitting: Family

## 2012-12-26 ENCOUNTER — Encounter: Payer: Medicare Other | Admitting: Family

## 2013-01-09 ENCOUNTER — Ambulatory Visit (INDEPENDENT_AMBULATORY_CARE_PROVIDER_SITE_OTHER): Payer: Medicare Other | Admitting: Family

## 2013-01-09 DIAGNOSIS — I4891 Unspecified atrial fibrillation: Secondary | ICD-10-CM

## 2013-01-09 DIAGNOSIS — Z7901 Long term (current) use of anticoagulants: Secondary | ICD-10-CM

## 2013-01-09 LAB — POCT INR: INR: 2.4

## 2013-01-09 NOTE — Patient Instructions (Addendum)
Continue with current dose. Recheck in 6 weeks.   Anticoagulation Dose Instructions as of 01/09/2013     Bonnie Roberts Tue Wed Thu Fri Sat   New Dose 2 mg 1 mg 2 mg 2 mg 2 mg 2 mg 2 mg    Description       Continue with current dose. Recheck in 6 weeks.

## 2013-01-29 ENCOUNTER — Ambulatory Visit (INDEPENDENT_AMBULATORY_CARE_PROVIDER_SITE_OTHER): Payer: Medicare Other | Admitting: Internal Medicine

## 2013-01-29 ENCOUNTER — Encounter: Payer: Self-pay | Admitting: Internal Medicine

## 2013-01-29 VITALS — BP 144/80 | HR 75 | Temp 98.4°F | Resp 20 | Wt 164.0 lb

## 2013-01-29 DIAGNOSIS — I4891 Unspecified atrial fibrillation: Secondary | ICD-10-CM

## 2013-01-29 DIAGNOSIS — Z7901 Long term (current) use of anticoagulants: Secondary | ICD-10-CM

## 2013-01-29 DIAGNOSIS — I10 Essential (primary) hypertension: Secondary | ICD-10-CM

## 2013-01-29 NOTE — Patient Instructions (Signed)
Limit your sodium (Salt) intake  Return in 4 months for follow-up  

## 2013-01-29 NOTE — Progress Notes (Signed)
Subjective:    Patient ID: Bonnie Roberts, female    DOB: 03/23/1922, 79 y.o.   MRN: [id number]  HPI  79 year old patient who is seen today for followup. She has chronic atrial fibrillation on chronic Coumadin anticoagulation. She has hypertension history of anxiety depression and chronic venous insufficiency. She is doing quite well today. No real change in her clinical status.  Past Medical History  Diagnosis Date  . ANXIETY DEPRESSION 12/11/2009  . Atrial fibrillation 04/10/2007  . COLONIC POLYPS, HX OF 04/10/2007  . DISORDER, MENOPAUSAL NOS 05/15/2007  . DYSPNEA 02/11/2009  . Edema 03/13/2009  . HYPERTENSION 04/10/2007  . Osteoarth NOS-Unspec 04/10/2007  . VENOUS INSUFFICIENCY, CHRONIC 01/22/2010    History   Social History  . Marital Status: Widowed    Spouse Name: N/A    Number of Children: N/A  . Years of Education: N/A   Occupational History  . Not on file.   Social History Main Topics  . Smoking status: Never Smoker   . Smokeless tobacco: Never Used  . Alcohol Use: No  . Drug Use: No  . Sexually Active: Not on file   Other Topics Concern  . Not on file   Social History Narrative  . No narrative on file    Past Surgical History  Procedure Laterality Date  . Cholecystectomy    . Carpal tunnel release    . Abdominal hysterectomy    . Total knee arthroplasty      right  . Rectocele repair    . Bladder surgery      tacking  . Hernia repair    . Cardiac catheterization      No family history on file.  Allergies  Allergen Reactions  . Codeine     REACTION: n/v  . Nsaids     REACTION: gib  . Penicillins     REACTION: rash  . Sulfonamide Derivatives     REACTION: rash    Current Outpatient Prescriptions on File Prior to Visit  Medication Sig Dispense Refill  . atenolol (TENORMIN) 25 MG tablet Take 1 tablet (25 mg total) by mouth daily.  90 tablet  3  . b complex vitamins capsule Take 1 capsule by mouth daily.        Marland Kitchen diltiazem (CARDIZEM CD) 120 MG 24  hr capsule Take 1 capsule (120 mg total) by mouth daily.  90 capsule  3  . docusate sodium (COLACE) 100 MG capsule Take 100 mg by mouth 2 (two) times daily.        . Flaxseed, Linseed, (BL FLAX SEED OIL) 1000 MG CAPS Take by mouth daily.        . furosemide (LASIX) 40 MG tablet Take 1 tablet (40 mg total) by mouth 2 (two) times daily.  180 tablet  3  . Multiple Vitamins-Minerals (CENTRUM SILVER PO) Take by mouth daily.        . Omega-3 Fatty Acids (FISH OIL) 1000 MG CAPS Take by mouth daily.        . potassium chloride SA (K-DUR,KLOR-CON) 20 MEQ tablet Take 20 mEq by mouth 2 (two) times daily.      . traMADol (ULTRAM) 50 MG tablet Take 1 tablet (50 mg total) by mouth every 8 (eight) hours as needed for pain.  30 tablet  0  . warfarin (COUMADIN) 2 MG tablet TAKE AS DIRECTED BY COUMADIN CLINIC.  90 tablet  0   No current facility-administered medications on file prior to visit.  BP 144/80  Pulse 75  Temp(Src) 98.4 F (36.9 C) (Oral)  Resp 20  Wt 164 lb (74.39 kg)  BMI 31 kg/m2  SpO2 97%       Review of Systems  Constitutional: Negative.   HENT: Negative for hearing loss, congestion, sore throat, rhinorrhea, dental problem, sinus pressure and tinnitus.   Eyes: Negative for pain, discharge and visual disturbance.  Respiratory: Negative for cough and shortness of breath.   Cardiovascular: Negative for chest pain, palpitations and leg swelling.  Gastrointestinal: Negative for nausea, vomiting, abdominal pain, diarrhea, constipation, blood in stool and abdominal distention.  Genitourinary: Negative for dysuria, urgency, frequency, hematuria, flank pain, vaginal bleeding, vaginal discharge, difficulty urinating, vaginal pain and pelvic pain.  Musculoskeletal: Negative for joint swelling, arthralgias and gait problem.  Skin: Negative for rash.  Neurological: Negative for dizziness, syncope, speech difficulty, weakness, numbness and headaches.  Hematological: Negative for adenopathy.   Psychiatric/Behavioral: Negative for behavioral problems, dysphoric mood and agitation. The patient is not nervous/anxious.        Objective:   Physical Exam  Constitutional: She is oriented to person, place, and time. She appears well-developed and well-nourished.  HENT:  Head: Normocephalic.  Right Ear: External ear normal.  Left Ear: External ear normal.  Mouth/Throat: Oropharynx is clear and moist.  Eyes: Conjunctivae and EOM are normal. Pupils are equal, round, and reactive to light.  Neck: Normal range of motion. Neck supple. No thyromegaly present.  Cardiovascular: Normal rate, normal heart sounds and intact distal pulses.   Controlled ventricular response  Pulmonary/Chest: Effort normal and breath sounds normal.  Abdominal: Soft. Bowel sounds are normal. She exhibits no mass. There is no tenderness.  Musculoskeletal: Normal range of motion. She exhibits edema.  Lymphadenopathy:    She has no cervical adenopathy.  Neurological: She is alert and oriented to person, place, and time.  Skin: Skin is warm and dry. No rash noted.  Psychiatric: She has a normal mood and affect. Her behavior is normal.          Assessment & Plan:   Hypertension well controlled Permanent atrial fibrillation Coumadin anticoagulation  No change in therapy Low-salt diet recommended Recheck 4 months

## 2013-02-14 ENCOUNTER — Telehealth: Payer: Self-pay | Admitting: Internal Medicine

## 2013-02-14 MED ORDER — WARFARIN SODIUM 2 MG PO TABS: ORAL_TABLET | ORAL | Status: DC

## 2013-02-14 NOTE — Telephone Encounter (Signed)
Patient called stating that she has been trying to refill her warfarin for over a week and is now completely out. Please assist and send to [facility] on [address] and inform patient when done.

## 2013-02-14 NOTE — Telephone Encounter (Signed)
Pt notified Rx refill for Warfarin sent to pharmacy.

## 2013-02-20 ENCOUNTER — Ambulatory Visit (INDEPENDENT_AMBULATORY_CARE_PROVIDER_SITE_OTHER): Payer: Medicare Other | Admitting: Family

## 2013-02-20 DIAGNOSIS — I4891 Unspecified atrial fibrillation: Secondary | ICD-10-CM

## 2013-02-20 DIAGNOSIS — Z7901 Long term (current) use of anticoagulants: Secondary | ICD-10-CM

## 2013-02-20 NOTE — Patient Instructions (Addendum)
Continue with current dose. Recheck in 6 weeks.   Anticoagulation Dose Instructions as of 02/20/2013     Bonnie Roberts Tue Wed Thu Fri Sat   New Dose 2 mg 1 mg 2 mg 2 mg 2 mg 2 mg 2 mg    Description       Continue with current dose. Recheck in 6 weeks.

## 2013-03-12 ENCOUNTER — Ambulatory Visit (INDEPENDENT_AMBULATORY_CARE_PROVIDER_SITE_OTHER): Payer: Medicare Other | Admitting: Cardiovascular Disease

## 2013-03-12 ENCOUNTER — Encounter: Payer: Self-pay | Admitting: Cardiovascular Disease

## 2013-03-12 VITALS — BP 158/75 | HR 86 | Ht 61.0 in | Wt 163.8 lb

## 2013-03-12 DIAGNOSIS — R609 Edema, unspecified: Secondary | ICD-10-CM

## 2013-03-12 MED ORDER — DOCUSATE SODIUM 100 MG PO CAPS: 100.0000 mg | ORAL_CAPSULE | Freq: Every day | ORAL | Status: AC

## 2013-03-12 MED ORDER — FISH OIL 1000 MG PO CAPS: ORAL_CAPSULE | ORAL | Status: AC

## 2013-03-12 NOTE — Progress Notes (Signed)
Patient ID: Bonnie Roberts, female   DOB: 1921-12-24, 79 y.o.   MRN: [id number] 79 year-old patient who is seen today for follow-up. She has a history of chronic atrial fibrillation on rate control and anticoagulation. Coumadin therapy. She has DJD and a history of well-controlled hypertension. She does remarkably well. She denies any cardiopulmonary complaints. She has had a recent cardiology visit. Her most recent INR was 2.2. She has mild chronic venous insufficiency and edema  Edema is worse despite low sodium and support hose.  Her arthritis prevents her from putting compression stockings on.  Still driving has a 23 some odd yo daughter that she lives with and helps since she is bipolar.    Myovue: 08/21/12    Overall Impression: Low risk stress nuclear study. There is a very small area of reversible ischemia at the apex ( SDS = 4). There perfusion in the other areas is normal.  LV Ejection Fraction: Study not gated.    ROS: Denies fever, malais, weight loss, blurry vision, decreased visual acuity, cough, sputum, SOB, hemoptysis, pleuritic pain, palpitaitons, heartburn, abdominal pain, melena, lower extremity edema, claudication, or rash.  All other systems reviewed and negative  General: Affect appropriate Healthy:  appears stated age HEENT: normal Neck supple with no adenopathy JVP normal no bruits no thyromegaly Lungs clear with no wheezing and good diaphragmatic motion Heart:  S1/S2 no murmur, no rub, gallop or click PMI normal Abdomen: benighn, BS positve, no tenderness, no AAA no bruit.  No HSM or HJR Distal pulses intact with no bruits Plus 2 edema with varicosities Neuro non-focal Skin warm and dry No muscular weakness   Current Outpatient Prescriptions  Medication Sig Dispense Refill  . atenolol (TENORMIN) 25 MG tablet Take 1 tablet (25 mg total) by mouth daily.  90 tablet  3  . b complex vitamins capsule Take 1 capsule by mouth daily.        Marland Kitchen diltiazem (CARDIZEM CD) 120  MG 24 hr capsule Take 1 capsule (120 mg total) by mouth daily.  90 capsule  3  . docusate sodium (COLACE) 100 MG capsule Take 1 capsule (100 mg total) by mouth daily.  10 capsule    . Flaxseed, Linseed, (BL FLAX SEED OIL) 1000 MG CAPS Take by mouth daily.        . furosemide (LASIX) 40 MG tablet Take 1 tablet (40 mg total) by mouth 2 (two) times daily.  180 tablet  3  . Multiple Vitamins-Minerals (CENTRUM SILVER PO) Take by mouth daily.        . potassium chloride SA (K-DUR,KLOR-CON) 20 MEQ tablet Take 20 mEq by mouth 2 (two) times daily.      . traMADol (ULTRAM) 50 MG tablet Take 1 tablet (50 mg total) by mouth every 8 (eight) hours as needed for pain.  30 tablet  0  . warfarin (COUMADIN) 2 MG tablet TAKE AS DIRECTED BY COUMADIN CLINIC.  90 tablet  1  . Omega-3 Fatty Acids (FISH OIL) 1000 MG CAPS 2 TABS DAILY       No current facility-administered medications for this visit.    Allergies  Codeine; Nsaids; Penicillins; and Sulfonamide derivatives  Electrocardiogram:  Afib rate 75 otherwise normal 11/13  Assessment and Plan

## 2013-03-12 NOTE — Assessment & Plan Note (Signed)
This actually bothers her more than anything Continue support hose and lasix  Refer to Dr Rennis Golden for mapping and possible alternative Rx

## 2013-03-12 NOTE — Assessment & Plan Note (Signed)
Good rate control and anticoagulation. Despite age still a good coumadin candidate

## 2013-03-12 NOTE — Patient Instructions (Addendum)
Your physician wants you to follow-up in: 6 MONTHS WITH DR Haywood Filler will receive a reminder letter in the mail two months in advance. If you don't receive a letter, please call our office to schedule the follow-up appointment. Your physician recommends that you continue on your current medications as directed. Please refer to the Current Medication list given to you today.  You have been referred to DR [place] HILTY  DX  VENOUS INSUFFIENCY AND EDEMA  [phone]

## 2013-03-12 NOTE — Assessment & Plan Note (Signed)
Well controlled.  Continue current medications and low sodium Dash type diet.    

## 2013-03-27 ENCOUNTER — Encounter: Payer: Self-pay | Admitting: Internal Medicine

## 2013-03-27 ENCOUNTER — Ambulatory Visit (INDEPENDENT_AMBULATORY_CARE_PROVIDER_SITE_OTHER): Payer: Medicare Other | Admitting: Internal Medicine

## 2013-03-27 VITALS — BP 160/84 | HR 81 | Ht 61.0 in | Wt 165.8 lb

## 2013-03-27 DIAGNOSIS — I872 Venous insufficiency (chronic) (peripheral): Secondary | ICD-10-CM

## 2013-03-27 DIAGNOSIS — I4891 Unspecified atrial fibrillation: Secondary | ICD-10-CM

## 2013-03-27 DIAGNOSIS — I10 Essential (primary) hypertension: Secondary | ICD-10-CM

## 2013-03-27 DIAGNOSIS — I83893 Varicose veins of bilateral lower extremities with other complications: Secondary | ICD-10-CM

## 2013-03-27 DIAGNOSIS — R609 Edema, unspecified: Secondary | ICD-10-CM

## 2013-03-27 NOTE — Progress Notes (Signed)
OFFICE NOTE  Chief Complaint:  Bilateral leg pain, varicose veins  Primary Care Physician: Rogelia Boga, MD  HPI:  Bonnie Roberts is a pleasant 79 year old female referred to me for evaluation of venous insufficiency. Per her report, she has long-standing venous reflux. In fact, she underwent a focal vein stripping surgery in 1949. Subsequently her varicose veins returned. She has a family history of varicose veins in her sister who has had multiple procedures. 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 She is now on chronic Coumadin therapy, but has another indication with atrial fibrillation. She has a history of superficial phlebitis as well. Symptomatically, she reports aching, heaviness, tiredness, fatigue, and swelling of both legs, with a slight worsening in the left leg. She has a history of right total knee replacement.  She was previously considered for stockings, however do to arthritis she felt that she could not wear them. She is here for evaluation of possible venous ablation.  PMHx:  Past Medical History  Diagnosis Date  . ANXIETY DEPRESSION 12/11/2009  . Atrial fibrillation 04/10/2007  . COLONIC POLYPS, HX OF 04/10/2007  . DISORDER, MENOPAUSAL NOS 05/15/2007  . DYSPNEA 02/11/2009  . Edema 03/13/2009  . HYPERTENSION 04/10/2007  . Osteoarth NOS-Unspec 04/10/2007  . VENOUS INSUFFICIENCY, CHRONIC 01/22/2010    Past Surgical History  Procedure Laterality Date  . Cholecystectomy  1965  . Carpal tunnel release    . Abdominal hysterectomy  1959  . Total knee arthroplasty  1993    right  . Rectocele repair    . Bladder surgery      tacking  . Hernia repair    . Cardiac catheterization      FAMHx:  Sister with varicose veins. Father died of stroke. Mother died of old age.  SOCHx:   reports that she has never smoked. She has never used smokeless tobacco. She reports that she does not drink alcohol or use illicit  drugs.  ALLERGIES:  Allergies  Allergen Reactions  . Codeine     REACTION: n/v  . Nsaids     REACTION: gib  . Penicillins     REACTION: rash  . Sulfonamide Derivatives     REACTION: rash    ROS: A comprehensive review of systems was negative except for: Cardiovascular: positive for Atrial fibrillation, venous insufficiency  HOME MEDS: Current Outpatient Prescriptions  Medication Sig Dispense Refill  . atenolol (TENORMIN) 25 MG tablet Take 1 tablet (25 mg total) by mouth daily.  90 tablet  3  . b complex vitamins capsule Take 1 capsule by mouth daily.        Marland Kitchen diltiazem (CARDIZEM CD) 120 MG 24 hr capsule Take 1 capsule (120 mg total) by mouth daily.  90 capsule  3  . docusate sodium (COLACE) 100 MG capsule Take 1 capsule (100 mg total) by mouth daily.  10 capsule    . Flaxseed, Linseed, (BL FLAX SEED OIL) 1000 MG CAPS Take by mouth daily.        . furosemide (LASIX) 40 MG tablet Take 1 tablet (40 mg total) by mouth 2 (two) times daily.  180 tablet  3  . Multiple Vitamins-Minerals (CENTRUM SILVER PO) Take by mouth daily.        . Omega-3 Fatty Acids (FISH OIL) 1000 MG CAPS 2 TABS DAILY      . potassium chloride SA (K-DUR,KLOR-CON) 20 MEQ tablet Take 20 mEq by mouth 2 (two) times daily.      Marland Kitchen  warfarin (COUMADIN) 2 MG tablet TAKE AS DIRECTED BY COUMADIN CLINIC.  90 tablet  1   No current facility-administered medications for this visit.    LABS/IMAGING: No results found for this or any previous visit (from the past 48 hour(s)). No results found.  VITALS: BP 160/84  Pulse 81  Ht 5\' 1"  (1.549 m)  Wt 165 lb 12.8 oz (75.206 kg)  BMI 31.34 kg/m2  EXAM: Extremities: edema 2+ bilateral, varicose veins noted and venous stasis dermatitis noted Skin: Chronic hemosiderin deposition  EKG: Atrial fibrillation with controlled ventricular response at 81  ASSESSMENT: 1. CEAP 1, 2, 3, 4a bilateral venous insufficiency  PLAN: 1.   Bonnie Roberts does appear to have bilateral venous  insufficiency which is symptomatic. Her symptoms are worse on the left than the right. She initially was hesitant to stockings, however we discuss them further and she now wishes to try them. We also discussed venous ablation, and she has some hesitations about the procedure, despite the fact that it is relatively safe and office-based with little down time. We'll go head and start with bilateral 20-30 mmHg compression stockings. We'll also plan bilateral venous insufficiency mapping studies to evaluate for the degree of reflux.  Plan to see her back in a few weeks to resume review the results of the studies and see if she's had any improvement with her stockings.  Thank you for the kind referral.  Chrystie Nose, MD, [facility] Attending Cardiologist [facility]  Bonnie Roberts 03/27/2013, 5:17 PM

## 2013-03-27 NOTE — Patient Instructions (Addendum)
Your physician has requested that you have a lower extremity venous duplex. This test is an ultrasound of the veins in the legs. It looks at venous blood flow that carries blood from the heart to the legs. Allow one hour for a Lower Venous exam. There are no restrictions or special instructions.  Your physician recommends that you schedule a follow-up appointment in: 2-3 weeks after venous ultrasound.    Dr. Rennis Golden has ordered compression stockings. Please wear these during the day and remove at night.  #2 Right #4 Left  R ankle 9in R calf 13 in L ankle 11in L calf 13in

## 2013-04-02 ENCOUNTER — Encounter: Payer: Medicare Other | Admitting: Family

## 2013-04-03 ENCOUNTER — Ambulatory Visit (INDEPENDENT_AMBULATORY_CARE_PROVIDER_SITE_OTHER): Payer: Medicare Other | Admitting: Family

## 2013-04-03 DIAGNOSIS — I4891 Unspecified atrial fibrillation: Secondary | ICD-10-CM

## 2013-04-03 DIAGNOSIS — Z7901 Long term (current) use of anticoagulants: Secondary | ICD-10-CM

## 2013-04-03 NOTE — Patient Instructions (Addendum)
Continue with current dose. Recheck in 6 weeks.  Anticoagulation Dose Instructions as of 04/03/2013     Bonnie Roberts Tue Wed Thu Fri Sat   New Dose 2 mg 1 mg 2 mg 2 mg 2 mg 2 mg 2 mg    Description       Continue with current dose. Recheck in 6 weeks.

## 2013-04-18 ENCOUNTER — Ambulatory Visit (HOSPITAL_COMMUNITY)
Admission: RE | Admit: 2013-04-18 | Discharge: 2013-04-18 | Disposition: A | Discharge status: Home / Self Care | Payer: Medicare Other | Source: Ambulatory Visit | Attending: Cardiology | Admitting: Cardiology

## 2013-04-18 DIAGNOSIS — I83893 Varicose veins of bilateral lower extremities with other complications: Secondary | ICD-10-CM | POA: Insufficient documentation

## 2013-04-18 DIAGNOSIS — M7989 Other specified soft tissue disorders: Secondary | ICD-10-CM | POA: Insufficient documentation

## 2013-04-18 NOTE — Progress Notes (Signed)
Lower Extremity Venous Duplex Completed. °Bonnie Roberts ° °

## 2013-04-24 ENCOUNTER — Ambulatory Visit (INDEPENDENT_AMBULATORY_CARE_PROVIDER_SITE_OTHER): Payer: Medicare Other | Admitting: Internal Medicine

## 2013-04-24 ENCOUNTER — Encounter: Payer: Self-pay | Admitting: Internal Medicine

## 2013-04-24 VITALS — BP 174/96 | HR 71 | Ht 61.0 in | Wt 163.0 lb

## 2013-04-24 DIAGNOSIS — I872 Venous insufficiency (chronic) (peripheral): Secondary | ICD-10-CM

## 2013-04-24 DIAGNOSIS — I4891 Unspecified atrial fibrillation: Secondary | ICD-10-CM

## 2013-04-24 NOTE — Progress Notes (Signed)
Marland Kitchen  OFFICE NOTE  Chief Complaint:  Bilateral leg pain, varicose veins  Primary Care Physician: Rogelia Boga, MD  HPI:  Bonnie Roberts is a pleasant 79 year old female referred to me for evaluation of venous insufficiency. Per her report, she has long-standing venous reflux. In fact, she underwent a focal vein stripping surgery in 1949. Subsequently her varicose veins returned. She has a family history of varicose veins in her sister who has had multiple procedures. Interestingly, Ms. Hodgdon has had 3 prior DVTs. Most of which have occurred in the left leg. She is now on chronic Coumadin therapy, but has another indication with atrial fibrillation. She has a history of superficial phlebitis as well. Symptomatically, she reports aching, heaviness, tiredness, fatigue, and swelling of both legs, with a slight worsening in the left leg. She has a history of right total knee replacement.  She was previously considered for stockings, however do to arthritis she felt that she could not wear them. She is here for evaluation of possible venous ablation.  PMHx:  Past Medical History  Diagnosis Date  . ANXIETY DEPRESSION 12/11/2009  . Atrial fibrillation 04/10/2007  . COLONIC POLYPS, HX OF 04/10/2007  . DISORDER, MENOPAUSAL NOS 05/15/2007  . DYSPNEA 02/11/2009  . Edema 03/13/2009  . HYPERTENSION 04/10/2007  . Osteoarth NOS-Unspec 04/10/2007  . VENOUS INSUFFICIENCY, CHRONIC 01/22/2010    Past Surgical History  Procedure Laterality Date  . Cholecystectomy  1965  . Carpal tunnel release    . Abdominal hysterectomy  1959  . Total knee arthroplasty  1993    right  . Rectocele repair    . Bladder surgery      tacking  . Hernia repair    . Cardiac catheterization      FAMHx:  Sister with varicose veins. Father died of stroke. Mother died of old age.  SOCHx:   reports that she has never smoked. She has never used smokeless tobacco. She reports that she does not drink alcohol or use illicit  drugs.  ALLERGIES:  Allergies  Allergen Reactions  . Codeine     REACTION: n/v  . Nsaids     REACTION: gib  . Penicillins     REACTION: rash  . Sulfonamide Derivatives     REACTION: rash    ROS: A comprehensive review of systems was negative except for: Cardiovascular: positive for Atrial fibrillation, venous insufficiency  HOME MEDS: Current Outpatient Prescriptions  Medication Sig Dispense Refill  . atenolol (TENORMIN) 25 MG tablet Take 1 tablet (25 mg total) by mouth daily.  90 tablet  3  . b complex vitamins capsule Take 1 capsule by mouth daily.        Marland Kitchen diltiazem (CARDIZEM CD) 120 MG 24 hr capsule Take 1 capsule (120 mg total) by mouth daily.  90 capsule  3  . docusate sodium (COLACE) 100 MG capsule Take 1 capsule (100 mg total) by mouth daily.  10 capsule    . Flaxseed, Linseed, (BL FLAX SEED OIL) 1000 MG CAPS Take by mouth daily.        . furosemide (LASIX) 40 MG tablet Take 1 tablet (40 mg total) by mouth 2 (two) times daily.  180 tablet  3  . Multiple Vitamins-Minerals (CENTRUM SILVER PO) Take by mouth daily.        . Omega-3 Fatty Acids (FISH OIL) 1000 MG CAPS 2 TABS DAILY      . potassium chloride SA (K-DUR,KLOR-CON) 20 MEQ tablet Take 20 mEq by mouth 2 (two)  times daily.      Marland Kitchen warfarin (COUMADIN) 2 MG tablet TAKE AS DIRECTED BY COUMADIN CLINIC.  90 tablet  1   No current facility-administered medications for this visit.    LABS/IMAGING: No results found for this or any previous visit (from the past 48 hour(s)). No results found.  VITALS: BP 174/96  Pulse 71  Ht 5\' 1"  (1.549 m)  Wt 163 lb (73.936 kg)  BMI 30.81 kg/m2  EXAM: Extremities: edema 2+ bilateral, varicose veins noted and venous stasis dermatitis noted Skin: Chronic hemosiderin deposition  EKG: deferred  ASSESSMENT: 1. CEAP 1, 2, 3, 4a bilateral venous insufficiency, L>>>R  PLAN: 1.   [name] had venous Dopplers performed on office on 04/18/2013. This demonstrated a small amount of  reflux of the right greater saphenous vein at the saphenofemoral junction, but no significant reflux on the right leg. There was however a tortuous, superficial left greater saphenous vein which demonstrated continuous reflux. Both short saphenous veins were negative for reflux. She reports that she did wear her left knee high stocking with some improvement in her swelling, but did have swelling at the knee which was quite uncomfortable. Unfortunately, due to the tortuosity of the vessel in the left leg as well as the superficial nature of the vessel, is not amenable to either radiofrequency or laser ablation. It could, however be managed by surgical vein stripping or foam sclerotherapy. I do not offer sclerotherapy at this time. I did offer a referral to [facility] to be evaluated for sclerotherapy, but she declined at this time.  She is now interested in trying a thigh-high stocking. I have measured her for this today and she will get fitted. We can see her back at an as-needed basis.  Chrystie Nose, MD, [facility] Attending Cardiologist [facility]  Kimbree Casanas C 04/24/2013, 11:16 AM

## 2013-04-24 NOTE — Patient Instructions (Addendum)
Dr. Rennis Golden has ordered thigh-high compression stockings to be worn on your LEFT LEG. Please wear during the day and remove at night. L thigh 21in L calf 13in L ankle 10in 20-30mmHg  Dr. Rennis Golden would like you to follow-up as needed.

## 2013-05-14 ENCOUNTER — Ambulatory Visit (INDEPENDENT_AMBULATORY_CARE_PROVIDER_SITE_OTHER): Payer: Medicare Other | Admitting: General Practice

## 2013-05-14 DIAGNOSIS — Z7901 Long term (current) use of anticoagulants: Secondary | ICD-10-CM

## 2013-05-14 DIAGNOSIS — I4891 Unspecified atrial fibrillation: Secondary | ICD-10-CM

## 2013-05-14 LAB — POCT INR: INR: 2.7

## 2013-05-16 ENCOUNTER — Encounter: Payer: Medicare Other | Admitting: Family

## 2013-06-04 ENCOUNTER — Ambulatory Visit (INDEPENDENT_AMBULATORY_CARE_PROVIDER_SITE_OTHER): Payer: Medicare Other | Admitting: Internal Medicine

## 2013-06-04 ENCOUNTER — Encounter: Payer: Self-pay | Admitting: Internal Medicine

## 2013-06-04 VITALS — BP 130/80 | HR 84 | Temp 97.9°F | Resp 20 | Wt 163.0 lb

## 2013-06-04 DIAGNOSIS — I872 Venous insufficiency (chronic) (peripheral): Secondary | ICD-10-CM

## 2013-06-04 DIAGNOSIS — Z23 Encounter for immunization: Secondary | ICD-10-CM

## 2013-06-04 DIAGNOSIS — R079 Chest pain, unspecified: Secondary | ICD-10-CM

## 2013-06-04 DIAGNOSIS — F341 Dysthymic disorder: Secondary | ICD-10-CM

## 2013-06-04 DIAGNOSIS — Z7901 Long term (current) use of anticoagulants: Secondary | ICD-10-CM

## 2013-06-04 DIAGNOSIS — I10 Essential (primary) hypertension: Secondary | ICD-10-CM

## 2013-06-04 DIAGNOSIS — R609 Edema, unspecified: Secondary | ICD-10-CM

## 2013-06-04 NOTE — Patient Instructions (Signed)
Limit your sodium (Salt) intake  Return in 6 months for follow-up  Follow cardiology

## 2013-06-04 NOTE — Progress Notes (Signed)
Subjective:    Patient ID: Bonnie Roberts, female    DOB: Jul 11, 1922, 79 y.o.   MRN: [id number]  HPI  79 year old patient who is on chronic Coumadin anticoagulation for atrial fibrillation. She has chronic venous insufficiency and some stable lower extremity edema. She remains on diuretic therapy. She has done quite well without concerns or complaints.  She is followed closely by cardiology. She has a history of anxiety depression which has been stable denies any cardiopulmonary complaints  Past Medical History  Diagnosis Date  . ANXIETY DEPRESSION 12/11/2009  . Atrial fibrillation 04/10/2007  . COLONIC POLYPS, HX OF 04/10/2007  . DISORDER, MENOPAUSAL NOS 05/15/2007  . DYSPNEA 02/11/2009  . Edema 03/13/2009  . HYPERTENSION 04/10/2007  . Osteoarth NOS-Unspec 04/10/2007  . VENOUS INSUFFICIENCY, CHRONIC 01/22/2010    History   Social History  . Marital Status: Widowed    Spouse Name: N/A    Number of Children: 4  . Years of Education: N/A   Occupational History  . RETIRED    Social History Main Topics  . Smoking status: Never Smoker   . Smokeless tobacco: Never Used  . Alcohol Use: No  . Drug Use: No  . Sexual Activity: Not on file   Other Topics Concern  . Not on file   Social History Narrative  . No narrative on file    Past Surgical History  Procedure Laterality Date  . Cholecystectomy  1965  . Carpal tunnel release    . Abdominal hysterectomy  1959  . Total knee arthroplasty  1993    right  . Rectocele repair    . Bladder surgery      tacking  . Hernia repair    . Cardiac catheterization      History reviewed. No pertinent family history.  Allergies  Allergen Reactions  . Codeine     REACTION: n/v  . Nsaids     REACTION: gib  . Penicillins     REACTION: rash  . Sulfonamide Derivatives     REACTION: rash    Current Outpatient Prescriptions on File Prior to Visit  Medication Sig Dispense Refill  . atenolol (TENORMIN) 25 MG tablet Take 1 tablet (25 mg  total) by mouth daily.  90 tablet  3  . b complex vitamins capsule Take 1 capsule by mouth daily.        Marland Kitchen diltiazem (CARDIZEM CD) 120 MG 24 hr capsule Take 1 capsule (120 mg total) by mouth daily.  90 capsule  3  . docusate sodium (COLACE) 100 MG capsule Take 1 capsule (100 mg total) by mouth daily.  10 capsule    . Flaxseed, Linseed, (BL FLAX SEED OIL) 1000 MG CAPS Take by mouth daily.        . furosemide (LASIX) 40 MG tablet Take 1 tablet (40 mg total) by mouth 2 (two) times daily.  180 tablet  3  . Multiple Vitamins-Minerals (CENTRUM SILVER PO) Take by mouth daily.        . Omega-3 Fatty Acids (FISH OIL) 1000 MG CAPS 2 TABS DAILY      . potassium chloride SA (K-DUR,KLOR-CON) 20 MEQ tablet Take 20 mEq by mouth 2 (two) times daily.      Marland Kitchen warfarin (COUMADIN) 2 MG tablet TAKE AS DIRECTED BY COUMADIN CLINIC.  90 tablet  1   No current facility-administered medications on file prior to visit.    BP 140/80  Pulse 84  Temp(Src) 97.9 F (36.6 C) (Oral)  Resp 20  Wt 163 lb (73.936 kg)  BMI 30.81 kg/m2  SpO2 98%       Review of Systems  Constitutional: Negative.   HENT: Negative for hearing loss, congestion, sore throat, rhinorrhea, dental problem, sinus pressure and tinnitus.   Eyes: Negative for pain, discharge and visual disturbance.  Respiratory: Negative for cough and shortness of breath.   Cardiovascular: Positive for leg swelling. Negative for chest pain and palpitations.  Gastrointestinal: Negative for nausea, vomiting, abdominal pain, diarrhea, constipation, blood in stool and abdominal distention.  Genitourinary: Negative for dysuria, urgency, frequency, hematuria, flank pain, vaginal bleeding, vaginal discharge, difficulty urinating, vaginal pain and pelvic pain.  Musculoskeletal: Negative for joint swelling, arthralgias and gait problem.  Skin: Negative for rash.  Neurological: Negative for dizziness, syncope, speech difficulty, weakness, numbness and headaches.   Hematological: Negative for adenopathy.  Psychiatric/Behavioral: Negative for behavioral problems, dysphoric mood and agitation. The patient is not nervous/anxious.        Objective:   Physical Exam  Constitutional: She is oriented to person, place, and time. She appears well-developed and well-nourished.  HENT:  Head: Normocephalic.  Right Ear: External ear normal.  Left Ear: External ear normal.  Mouth/Throat: Oropharynx is clear and moist.  Eyes: Conjunctivae and EOM are normal. Pupils are equal, round, and reactive to light.  Neck: Normal range of motion. Neck supple. No thyromegaly present.  Cardiovascular: Normal rate, regular rhythm, normal heart sounds and intact distal pulses.   Pulmonary/Chest: Effort normal and breath sounds normal.  Abdominal: Soft. Bowel sounds are normal. She exhibits no mass. There is no tenderness.  Musculoskeletal: Normal range of motion. She exhibits edema.  Left greater than right. Edema  Lymphadenopathy:    She has no cervical adenopathy.  Neurological: She is alert and oriented to person, place, and time.  Skin: Skin is warm and dry. No rash noted.  Psychiatric: She has a normal mood and affect. Her behavior is normal.          Assessment & Plan:   Atrial fibrillation stable. Continue rate control and chronic Coumadin anticoagulation Hypertension stable Chronic venous insufficiency  Schedule CPX 6 months Follow cardiology

## 2013-06-13 ENCOUNTER — Ambulatory Visit (INDEPENDENT_AMBULATORY_CARE_PROVIDER_SITE_OTHER): Payer: Medicare Other | Admitting: Internal Medicine

## 2013-06-13 ENCOUNTER — Encounter: Payer: Self-pay | Admitting: Internal Medicine

## 2013-06-13 VITALS — BP 130/76 | HR 72 | Temp 97.6°F | Resp 20 | Wt 160.0 lb

## 2013-06-13 DIAGNOSIS — I10 Essential (primary) hypertension: Secondary | ICD-10-CM

## 2013-06-13 DIAGNOSIS — R300 Dysuria: Secondary | ICD-10-CM

## 2013-06-13 LAB — POCT URINALYSIS DIPSTICK
Bilirubin, UA: NEGATIVE
Glucose, UA: NEGATIVE
Ketones, UA: NEGATIVE
Spec Grav, UA: 1.01
Urobilinogen, UA: 0.2

## 2013-06-13 MED ORDER — CIPROFLOXACIN HCL 500 MG PO TABS: 500.0000 mg | ORAL_TABLET | Freq: Two times a day (BID) | ORAL | Status: DC

## 2013-06-13 NOTE — Patient Instructions (Signed)
Take your antibiotic as prescribed until ALL of it is gone, but stop if you develop a rash, swelling, or any side effects of the medication.  Contact our office as soon as possible if  there are side effects of the medication.  Call or return to clinic prn if these symptoms worsen or fail to improve as anticipated.  

## 2013-06-13 NOTE — Progress Notes (Signed)
Subjective:    Patient ID: Bonnie Roberts, female    DOB: April 11, 1922, 79 y.o.   MRN: [id number]  HPI  79 year old patient who is seen today for followup. Her chief complaint is dysuria there is been present for several days she also complains of the right knee pain. She's had a prior history of right total knee replacement surgery. She walks with a cane. Has had no falls. At times she is fearful of driving  Past Medical History  Diagnosis Date  . ANXIETY DEPRESSION 12/11/2009  . Atrial fibrillation 04/10/2007  . COLONIC POLYPS, HX OF 04/10/2007  . DISORDER, MENOPAUSAL NOS 05/15/2007  . DYSPNEA 02/11/2009  . Edema 03/13/2009  . HYPERTENSION 04/10/2007  . Osteoarth NOS-Unspec 04/10/2007  . VENOUS INSUFFICIENCY, CHRONIC 01/22/2010    History   Social History  . Marital Status: Widowed    Spouse Name: N/A    Number of Children: 4  . Years of Education: N/A   Occupational History  . RETIRED    Social History Main Topics  . Smoking status: Never Smoker   . Smokeless tobacco: Never Used  . Alcohol Use: No  . Drug Use: No  . Sexual Activity: Not on file   Other Topics Concern  . Not on file   Social History Narrative  . No narrative on file    Past Surgical History  Procedure Laterality Date  . Cholecystectomy  1965  . Carpal tunnel release    . Abdominal hysterectomy  1959  . Total knee arthroplasty  1993    right  . Rectocele repair    . Bladder surgery      tacking  . Hernia repair    . Cardiac catheterization      History reviewed. No pertinent family history.  Allergies  Allergen Reactions  . Codeine     REACTION: n/v  . Nsaids     REACTION: gib  . Penicillins     REACTION: rash  . Sulfonamide Derivatives     REACTION: rash    Current Outpatient Prescriptions on File Prior to Visit  Medication Sig Dispense Refill  . atenolol (TENORMIN) 25 MG tablet Take 1 tablet (25 mg total) by mouth daily.  90 tablet  3  . b complex vitamins capsule Take 1 capsule by  mouth daily.        Marland Kitchen diltiazem (CARDIZEM CD) 120 MG 24 hr capsule Take 1 capsule (120 mg total) by mouth daily.  90 capsule  3  . docusate sodium (COLACE) 100 MG capsule Take 1 capsule (100 mg total) by mouth daily.  10 capsule    . Flaxseed, Linseed, (BL FLAX SEED OIL) 1000 MG CAPS Take by mouth daily.        . furosemide (LASIX) 40 MG tablet Take 1 tablet (40 mg total) by mouth 2 (two) times daily.  180 tablet  3  . Multiple Vitamins-Minerals (CENTRUM SILVER PO) Take by mouth daily.        . Omega-3 Fatty Acids (FISH OIL) 1000 MG CAPS 2 TABS DAILY      . potassium chloride SA (K-DUR,KLOR-CON) 20 MEQ tablet Take 20 mEq by mouth 2 (two) times daily.      Marland Kitchen warfarin (COUMADIN) 2 MG tablet TAKE AS DIRECTED BY COUMADIN CLINIC.  90 tablet  1   No current facility-administered medications on file prior to visit.    BP 130/76  Pulse 72  Temp(Src) 97.6 F (36.4 C) (Oral)  Resp 20  Wt 160  lb (72.576 kg)  BMI 30.25 kg/m2       Review of Systems  Constitutional: Negative.   HENT: Negative for hearing loss, congestion, sore throat, rhinorrhea, dental problem, sinus pressure and tinnitus.   Eyes: Negative for pain, discharge and visual disturbance.  Respiratory: Negative for cough and shortness of breath.   Cardiovascular: Negative for chest pain, palpitations and leg swelling.  Gastrointestinal: Negative for nausea, vomiting, abdominal pain, diarrhea, constipation, blood in stool and abdominal distention.  Genitourinary: Positive for dysuria. Negative for urgency, frequency, hematuria, flank pain, vaginal bleeding, vaginal discharge, difficulty urinating, vaginal pain and pelvic pain.  Musculoskeletal: Positive for gait problem. Negative for joint swelling and arthralgias.  Skin: Negative for rash.  Neurological: Negative for dizziness, syncope, speech difficulty, weakness, numbness and headaches.  Hematological: Negative for adenopathy.  Psychiatric/Behavioral: Negative for behavioral  problems, dysphoric mood and agitation. The patient is not nervous/anxious.        Objective:   Physical Exam  Constitutional: She appears well-developed and well-nourished. No distress.  Musculoskeletal:  Examination right knee revealed a  surgical scar. There is no active inflammatory changes          Assessment & Plan:   UTI  will treat with Cipro for 3 days Right knee pain. Status post right total knee replacement surgery. He has been symptomatic for a couple of weeks. Will treat symptomatically for another week or 2 and has pain continues we'll set up for orthopedic evaluation

## 2013-06-18 ENCOUNTER — Telehealth: Payer: Self-pay | Admitting: Internal Medicine

## 2013-06-18 DIAGNOSIS — M25561 Pain in right knee: Secondary | ICD-10-CM

## 2013-06-18 NOTE — Telephone Encounter (Signed)
Spoke to pt told her referral done for Ortho and someone will be contacting her to schedule an appt. Pt verbalized understanding.

## 2013-06-18 NOTE — Telephone Encounter (Signed)
PT called and stated that her knee is still bothering her, and that she would like to see an orthopedic doctor. She states that this was discussed at her last appointment. Please assist.

## 2013-06-21 ENCOUNTER — Other Ambulatory Visit: Payer: Self-pay | Admitting: Internal Medicine

## 2013-06-28 ENCOUNTER — Ambulatory Visit (INDEPENDENT_AMBULATORY_CARE_PROVIDER_SITE_OTHER): Payer: Medicare Other | Admitting: General Practice

## 2013-06-28 DIAGNOSIS — Z7901 Long term (current) use of anticoagulants: Secondary | ICD-10-CM

## 2013-06-28 DIAGNOSIS — I4891 Unspecified atrial fibrillation: Secondary | ICD-10-CM

## 2013-06-28 LAB — POCT INR: INR: 3.2

## 2013-07-26 ENCOUNTER — Ambulatory Visit (INDEPENDENT_AMBULATORY_CARE_PROVIDER_SITE_OTHER): Payer: Medicare Other | Admitting: General Practice

## 2013-07-26 DIAGNOSIS — Z7901 Long term (current) use of anticoagulants: Secondary | ICD-10-CM

## 2013-07-26 DIAGNOSIS — I4891 Unspecified atrial fibrillation: Secondary | ICD-10-CM

## 2013-08-11 ENCOUNTER — Other Ambulatory Visit: Payer: Self-pay | Admitting: Internal Medicine

## 2013-08-16 ENCOUNTER — Other Ambulatory Visit: Payer: Self-pay | Admitting: Internal Medicine

## 2013-08-18 ENCOUNTER — Other Ambulatory Visit: Payer: Self-pay | Admitting: Internal Medicine

## 2013-08-21 ENCOUNTER — Other Ambulatory Visit: Payer: Self-pay | Admitting: General Practice

## 2013-08-21 MED ORDER — WARFARIN SODIUM 2 MG PO TABS: ORAL_TABLET | ORAL | Status: DC

## 2013-08-23 ENCOUNTER — Ambulatory Visit (INDEPENDENT_AMBULATORY_CARE_PROVIDER_SITE_OTHER): Payer: Medicare Other | Admitting: General Practice

## 2013-08-23 DIAGNOSIS — Z7901 Long term (current) use of anticoagulants: Secondary | ICD-10-CM

## 2013-08-23 DIAGNOSIS — I4891 Unspecified atrial fibrillation: Secondary | ICD-10-CM

## 2013-08-23 NOTE — Progress Notes (Signed)
Pre-visit discussion using our clinic review tool. No additional management support is needed unless otherwise documented below in the visit note.  

## 2013-09-13 ENCOUNTER — Encounter: Payer: Self-pay | Admitting: Cardiovascular Disease

## 2013-09-13 ENCOUNTER — Ambulatory Visit (INDEPENDENT_AMBULATORY_CARE_PROVIDER_SITE_OTHER): Payer: Medicare Other | Admitting: Cardiovascular Disease

## 2013-09-13 VITALS — BP 154/80 | HR 72 | Wt 161.0 lb

## 2013-09-13 DIAGNOSIS — I10 Essential (primary) hypertension: Secondary | ICD-10-CM

## 2013-09-13 DIAGNOSIS — I4891 Unspecified atrial fibrillation: Secondary | ICD-10-CM

## 2013-09-13 DIAGNOSIS — M1990 Unspecified osteoarthritis, unspecified site: Secondary | ICD-10-CM

## 2013-09-13 DIAGNOSIS — I872 Venous insufficiency (chronic) (peripheral): Secondary | ICD-10-CM

## 2013-09-13 NOTE — Progress Notes (Signed)
Patient ID: Bonnie Roberts, female   DOB: 03-19-1922, 79 y.o.   MRN: [id number] 79 year-old patient who is seen today for follow-up. She has a history of chronic atrial fibrillation on rate control and anticoagulation. Coumadin therapy. She has DJD and a history of well-controlled hypertension. She does remarkably well. She denies any cardiopulmonary complaints. She has had a recent cardiology visit. Her most recent INR was 2.2. She has mild chronic venous insufficiency and edema Edema is worse despite low sodium and support hose. Her arthritis prevents her from putting compression stockings on. Still driving has a 29 some odd yo daughter that she lives with and helps since she is bipolar.   Myovue: 08/21/12 Overall Impression: Low risk stress nuclear study. There is a very small area of reversible ischemia at the apex ( SDS = 4). There perfusion in the other areas is normal.  LV Ejection Fraction: Study not gated.   Doing well saw Dr Rennis Golden for veins in legs but decided to use support hose instead   ROS: Denies fever, malais, weight loss, blurry vision, decreased visual acuity, cough, sputum, SOB, hemoptysis, pleuritic pain, palpitaitons, heartburn, abdominal pain, melena, lower extremity edema, claudication, or rash.  All other systems reviewed and negative  General: Affect appropriate Healthy:  appears stated age HEENT: normal Neck supple with no adenopathy JVP normal no bruits no thyromegaly Lungs clear with no wheezing and good diaphragmatic motion Heart:  S1/S2 no murmur, no rub, gallop or click PMI normal Abdomen: benighn, BS positve, no tenderness, no AAA no bruit.  No HSM or HJR Distal pulses intact with no bruits Plus one bilateral edema with varicosities Neuro non-focal Skin warm and dry No muscular weakness  S/P right TKR    Current Outpatient Prescriptions  Medication Sig Dispense Refill  . atenolol (TENORMIN) 25 MG tablet Take 1 tablet (25 mg total) by mouth daily.  90  tablet  3  . b complex vitamins capsule Take 1 capsule by mouth daily.        . ciprofloxacin (CIPRO) 500 MG tablet Take 1 tablet (500 mg total) by mouth 2 (two) times daily.  6 tablet  0  . diltiazem (CARDIZEM CD) 120 MG 24 hr capsule TAKE 1 CAPSULE (120 MG TOTAL) BY MOUTH DAILY.  90 capsule  3  . docusate sodium (COLACE) 100 MG capsule Take 1 capsule (100 mg total) by mouth daily.  10 capsule    . Flaxseed, Linseed, (BL FLAX SEED OIL) 1000 MG CAPS Take by mouth daily.        . furosemide (LASIX) 40 MG tablet Take 1 tablet (40 mg total) by mouth 2 (two) times daily.  180 tablet  3  . Multiple Vitamins-Minerals (CENTRUM SILVER PO) Take by mouth daily.        . Omega-3 Fatty Acids (FISH OIL) 1000 MG CAPS 2 TABS DAILY      . potassium chloride SA (K-DUR,KLOR-CON) 20 MEQ tablet TAKE 1 TABLET BY MOUTH TWICE A DAY  180 tablet  3  . warfarin (COUMADIN) 2 MG tablet TAKE AS DIRECTED BY COUMADIN CLINIC.  90 tablet  1   No current facility-administered medications for this visit.    Allergies  Codeine; Nsaids; Penicillins; and Sulfonamide derivatives  Electrocardiogram:  afib nonspecific ST T wave changes   Assessment and Plan

## 2013-09-13 NOTE — Assessment & Plan Note (Signed)
F/U Alusio  Right TKA with some issues  PRN pain meds

## 2013-09-13 NOTE — Assessment & Plan Note (Signed)
Good rate control  Despite age still a good candidate for anticoagulation

## 2013-09-13 NOTE — Patient Instructions (Signed)
Your physician wants you to follow-up in:  6 MONTHS WITH DR NISHAN  You will receive a reminder letter in the mail two months in advance. If you don't receive a letter, please call our office to schedule the follow-up appointment. Your physician recommends that you continue on your current medications as directed. Please refer to the Current Medication list given to you today. 

## 2013-09-13 NOTE — Assessment & Plan Note (Signed)
Low sodium diet Support hose Not interested in ablative Rx

## 2013-09-13 NOTE — Assessment & Plan Note (Signed)
Well controlled.  Continue current medications and low sodium Dash type diet.    

## 2013-09-19 LAB — HM MAMMOGRAPHY: HM Mammogram: NEGATIVE

## 2013-09-20 ENCOUNTER — Ambulatory Visit (INDEPENDENT_AMBULATORY_CARE_PROVIDER_SITE_OTHER): Payer: Medicare Other | Admitting: General Practice

## 2013-09-20 DIAGNOSIS — I4891 Unspecified atrial fibrillation: Secondary | ICD-10-CM

## 2013-09-20 DIAGNOSIS — Z7901 Long term (current) use of anticoagulants: Secondary | ICD-10-CM

## 2013-09-20 LAB — POCT INR: INR: 3

## 2013-09-20 NOTE — Progress Notes (Signed)
Pre-visit discussion using our clinic review tool. No additional management support is needed unless otherwise documented below in the visit note.  

## 2013-09-22 ENCOUNTER — Encounter: Payer: Self-pay | Admitting: Internal Medicine

## 2013-09-28 ENCOUNTER — Other Ambulatory Visit: Payer: Self-pay | Admitting: Internal Medicine

## 2013-10-12 ENCOUNTER — Other Ambulatory Visit: Payer: Self-pay | Admitting: Internal Medicine

## 2013-10-16 ENCOUNTER — Ambulatory Visit (INDEPENDENT_AMBULATORY_CARE_PROVIDER_SITE_OTHER): Payer: Medicare Other | Admitting: Family

## 2013-10-16 DIAGNOSIS — I4891 Unspecified atrial fibrillation: Secondary | ICD-10-CM

## 2013-10-16 DIAGNOSIS — Z7901 Long term (current) use of anticoagulants: Secondary | ICD-10-CM

## 2013-10-16 LAB — POCT INR: INR: 2.4

## 2013-10-16 NOTE — Patient Instructions (Signed)
Continue to take 1 tablet all days except 1/2 tablet on Monday/Thursday.  Re-check in 4 weeks.  Anticoagulation Dose Instructions as of 10/16/2013     Bonnie Roberts Tue Wed Thu Fri Sat   New Dose 2 mg 1 mg 2 mg 2 mg 1 mg 2 mg 2 mg    Description       Continue to take 1 tablet all days except 1/2 tablet on Monday/Thursday.  Re-check in 4 weeks.

## 2013-10-18 ENCOUNTER — Ambulatory Visit: Payer: Medicare Other

## 2013-11-14 ENCOUNTER — Ambulatory Visit (INDEPENDENT_AMBULATORY_CARE_PROVIDER_SITE_OTHER): Payer: Medicare Other | Admitting: Family

## 2013-11-14 ENCOUNTER — Ambulatory Visit (INDEPENDENT_AMBULATORY_CARE_PROVIDER_SITE_OTHER): Payer: Medicare Other | Admitting: Internal Medicine

## 2013-11-14 ENCOUNTER — Encounter: Payer: Self-pay | Admitting: Internal Medicine

## 2013-11-14 VITALS — BP 138/80 | HR 78 | Temp 97.8°F | Resp 20 | Ht 60.0 in | Wt 159.0 lb

## 2013-11-14 DIAGNOSIS — I872 Venous insufficiency (chronic) (peripheral): Secondary | ICD-10-CM

## 2013-11-14 DIAGNOSIS — Z7901 Long term (current) use of anticoagulants: Secondary | ICD-10-CM

## 2013-11-14 DIAGNOSIS — F341 Dysthymic disorder: Secondary | ICD-10-CM

## 2013-11-14 DIAGNOSIS — Z86010 Personal history of colonic polyps: Secondary | ICD-10-CM

## 2013-11-14 DIAGNOSIS — I10 Essential (primary) hypertension: Secondary | ICD-10-CM

## 2013-11-14 DIAGNOSIS — M1990 Unspecified osteoarthritis, unspecified site: Secondary | ICD-10-CM

## 2013-11-14 DIAGNOSIS — Z0000 Encounter for general adult medical examination without abnormal findings: Secondary | ICD-10-CM

## 2013-11-14 DIAGNOSIS — I4891 Unspecified atrial fibrillation: Secondary | ICD-10-CM

## 2013-11-14 LAB — CBC WITH DIFFERENTIAL/PLATELET
Basophils Absolute: 0 10*3/uL (ref 0.0–0.1)
Basophils Relative: 0.6 % (ref 0.0–3.0)
EOS ABS: 0.1 10*3/uL (ref 0.0–0.7)
Eosinophils Relative: 1.7 % (ref 0.0–5.0)
HCT: 43.7 % (ref 36.0–46.0)
Hemoglobin: 14.3 g/dL (ref 12.0–15.0)
Lymphocytes Relative: 32.1 % (ref 12.0–46.0)
Lymphs Abs: 2.2 10*3/uL (ref 0.7–4.0)
MCHC: 32.8 g/dL (ref 30.0–36.0)
MCV: 96.1 fl (ref 78.0–100.0)
Monocytes Absolute: 0.6 10*3/uL (ref 0.1–1.0)
Monocytes Relative: 8.4 % (ref 3.0–12.0)
NEUTROS PCT: 57.2 % (ref 43.0–77.0)
Neutro Abs: 3.8 10*3/uL (ref 1.4–7.7)
PLATELETS: 208 10*3/uL (ref 150.0–400.0)
RBC: 4.55 Mil/uL (ref 3.87–5.11)
RDW: 14.7 % — ABNORMAL HIGH (ref 11.5–14.6)
WBC: 6.7 10*3/uL (ref 4.5–10.5)

## 2013-11-14 LAB — COMPREHENSIVE METABOLIC PANEL
ALBUMIN: 3.8 g/dL (ref 3.5–5.2)
ALK PHOS: 75 U/L (ref 39–117)
ALT: 17 U/L (ref 0–35)
AST: 26 U/L (ref 0–37)
BUN: 22 mg/dL (ref 6–23)
CO2: 31 mEq/L (ref 19–32)
Calcium: 8.9 mg/dL (ref 8.4–10.5)
Chloride: 100 mEq/L (ref 96–112)
Creatinine, Ser: 1.1 mg/dL (ref 0.4–1.2)
GFR: 50.22 mL/min — ABNORMAL LOW (ref >60.00)
Glucose, Bld: 98 mg/dL (ref 70–99)
Potassium: 3.8 mEq/L (ref 3.5–5.1)
SODIUM: 141 meq/L (ref 135–145)
TOTAL PROTEIN: 7.1 g/dL (ref 6.0–8.3)
Total Bilirubin: 1.5 mg/dL — ABNORMAL HIGH (ref 0.3–1.2)

## 2013-11-14 LAB — LIPID PANEL
Cholesterol: 215 mg/dL — ABNORMAL HIGH (ref 0–200)
HDL: 51.9 mg/dL (ref >39.00)
Total CHOL/HDL Ratio: 4
Triglycerides: 68 mg/dL (ref 0.0–149.0)
VLDL: 13.6 mg/dL (ref 0.0–40.0)

## 2013-11-14 LAB — POCT INR: INR: 2.2

## 2013-11-14 MED ORDER — TRAMADOL HCL 50 MG PO TABS: 50.0000 mg | ORAL_TABLET | Freq: Three times a day (TID) | ORAL | Status: DC | PRN

## 2013-11-14 NOTE — Patient Instructions (Signed)
Limit your sodium (Salt) intake  Take [phone] mg of Tylenol every 6 hours as needed for pain relief or fever.  Avoid taking more than 3000 mg in a 24-hour period (  This may cause liver damage).

## 2013-11-14 NOTE — Progress Notes (Signed)
Subjective:    Patient ID: Bonnie Roberts, female    DOB: 01-31-1922, 80 y.o.   MRN: [id number]  HPI  Subjective:    Patient ID: Bonnie Roberts, female    DOB: September 10, 1922, 80 y.o.   MRN: [id number]  HPI  History of Present Illness:   80  year-old patient who is seen today for follow-up and annual health assessment.  She has a history of chronic atrial fibrillation on rate control and anticoagulation. Coumadin therapy. She has DJD and a history of well-controlled hypertension. She does remarkably well. She denies any cardiopulmonary complaints. She has had a recent cardiology visit. Her most recent INR was 2.2. She has mild chronic venous insufficiency and edema   Allergies:   1) ! Pcn  2) ! Sulfa  3) ! Nsaids  4) ! Codeine   Past History:  Past Medical History:     ATRIAL FIBRILLATION (ICD-427.31)  HYPERTENSION (ICD-401.9)  EDEMA (ICD-782.3)  DYSPNEA (ICD-786.05)  HEMORRHOID, EXTERNAL, THROMBOSED (ICD-455.4)  DISORDER, MENOPAUSAL NOS (ICD-627.9)  OSTEOARTHRITIS (ICD-715.90)  DEGENERATIVE JOINT DISEASE (ICD-715.90)  COLONIC POLYPS, HX OF (ICD-V12.72)   Past Surgical History:    Carpal tunnel release-right  Cholecystectomy-1967  Hysterectomy-1959  Total knee replacement-right-2003  Rectocele/bladder tacking-1996  hernia repair-left  G 4 P 4 AB 0  Heart cath-2006  Colonoscopy-05/2004  Past Medical History  Diagnosis Date  . ANXIETY DEPRESSION 12/11/2009  . Atrial fibrillation 04/10/2007  . COLONIC POLYPS, HX OF 04/10/2007  . DISORDER, MENOPAUSAL NOS 05/15/2007  . DYSPNEA 02/11/2009  . Edema 03/13/2009  . HYPERTENSION 04/10/2007  . Osteoarth NOS-Unspec 04/10/2007  . VENOUS INSUFFICIENCY, CHRONIC 01/22/2010    History   Social History  . Marital Status: Widowed    Spouse Name: N/A    Number of Children: 4  . Years of Education: N/A   Occupational History  . RETIRED    Social History Main Topics  . Smoking status: Never Smoker   . Smokeless tobacco: Never  Used  . Alcohol Use: No  . Drug Use: No  . Sexual Activity: Not on file   Other Topics Concern  . Not on file   Social History Narrative  . No narrative on file    Past Surgical History  Procedure Laterality Date  . Cholecystectomy  1965  . Carpal tunnel release    . Abdominal hysterectomy  1959  . Total knee arthroplasty  1993    right  . Rectocele repair    . Bladder surgery      tacking  . Hernia repair    . Cardiac catheterization      History reviewed. No pertinent family history.  Allergies  Allergen Reactions  . Codeine     REACTION: n/v  . Nsaids     REACTION: gib  . Penicillins     REACTION: rash  . Sulfonamide Derivatives     REACTION: rash    Current Outpatient Prescriptions on File Prior to Visit  Medication Sig Dispense Refill  . atenolol (TENORMIN) 25 MG tablet TAKE 1 TABLET (25 MG TOTAL) BY MOUTH DAILY.  90 tablet  1  . b complex vitamins capsule Take 1 capsule by mouth daily.        Marland Kitchen diltiazem (CARDIZEM CD) 120 MG 24 hr capsule TAKE 1 CAPSULE (120 MG TOTAL) BY MOUTH DAILY.  90 capsule  3  . docusate sodium (COLACE) 100 MG capsule Take 1 capsule (100 mg total) by mouth daily.  10 capsule    .  Flaxseed, Linseed, (BL FLAX SEED OIL) 1000 MG CAPS Take by mouth daily.        . furosemide (LASIX) 40 MG tablet TAKE 1 TABLET (40 MG TOTAL) BY MOUTH 2 (TWO) TIMES DAILY.  180 tablet  3  . Multiple Vitamins-Minerals (CENTRUM SILVER PO) Take by mouth daily.        . Omega-3 Fatty Acids (FISH OIL) 1000 MG CAPS 2 TABS DAILY      . potassium chloride SA (K-DUR,KLOR-CON) 20 MEQ tablet TAKE 1 TABLET BY MOUTH TWICE A DAY  180 tablet  3  . warfarin (COUMADIN) 2 MG tablet TAKE AS DIRECTED BY COUMADIN CLINIC.  90 tablet  1   No current facility-administered medications on file prior to visit.    BP 138/80  Pulse 78  Temp(Src) 97.8 F (36.6 C) (Oral)  Resp 20  Ht 5' (1.524 m)  Wt 159 lb (72.122 kg)  BMI 31.05 kg/m2  SpO2 95%     1. Risk factors, based on  past  M,S,F history- cardiovascular risk factors include hypertension and age. She has chronic atrial fibrillation.  2.  Physical activities: Fairly active for 80  years of age does have some arthritis limiting activity  3.  Depression/mood: No history depression or mood disorder  4.  Hearing: Moderately severe deficits. Uses hearing aids  5.  ADL's: Independent in all aspects of daily living. Cares for a disabled daughter  41.  Fall risk: Moderate due to age and unsteadiness 7.  Home safety: No problems identified  8.  Height weight, and visual acuity; height and weight stable no change in visual acuity  9.  Counseling: Modest weight loss encouraged calcium and vitamin D supplements encouraged  10. Lab orders based on risk factors: Laboratory update will be reviewed  11. Referral : Followup cardiology  12. Care plan: Follow Coumadin clinic  13. Cognitive assessment: Alert and oriented with normal affect. No cognitive dysfunction     Review of Systems  Constitutional: Negative for fever, appetite change, fatigue and unexpected weight change.  HENT: Negative for hearing loss, ear pain, nosebleeds, congestion, sore throat, mouth sores, trouble swallowing, neck stiffness, dental problem, voice change, sinus pressure and tinnitus.   Eyes: Negative for photophobia, pain, redness and visual disturbance.  Respiratory: Negative for cough, chest tightness and shortness of breath.   Cardiovascular: Positive for leg swelling. Negative for chest pain and palpitations.  Gastrointestinal: Negative for nausea, vomiting, abdominal pain, diarrhea, constipation, blood in stool, abdominal distention and rectal pain.  Genitourinary: Negative for dysuria, urgency, frequency, hematuria, flank pain, vaginal bleeding, vaginal discharge, difficulty urinating, genital sores, vaginal pain, menstrual problem and pelvic pain.  Musculoskeletal: Positive for back pain and arthralgias.  Skin: Negative for rash.    Neurological: Negative for dizziness, syncope, speech difficulty, weakness, light-headedness, numbness and headaches.  Hematological: Negative for adenopathy. Does not bruise/bleed easily.  Psychiatric/Behavioral: Negative for suicidal ideas, behavioral problems, self-injury, dysphoric mood and agitation. The patient is not nervous/anxious.        Objective:   Physical Exam  Constitutional: She is oriented to person, place, and time. She appears well-developed and well-nourished.  HENT:  Head: Normocephalic and atraumatic.  Right Ear: External ear normal.  Left Ear: External ear normal.  Mouth/Throat: Oropharynx is clear and moist.  Eyes: Conjunctivae normal and EOM are normal.  Neck: Normal range of motion. Neck supple. No JVD present. No thyromegaly present.  Cardiovascular: Normal rate, regular rhythm, normal heart sounds and intact distal pulses.  No murmur heard.      Dorsalis pedis pulses full. Posterior tibial pulses not easily palpable  Pulmonary/Chest: Effort normal and breath sounds normal. She has no wheezes. She has no rales.  Abdominal: Soft. Bowel sounds are normal. She exhibits no distension and no mass. There is no tenderness. There is no rebound and no guarding.  Musculoskeletal: Normal range of motion. She exhibits no edema and no tenderness.       Left ankle edema  Chronic varicosities  Neurological: She is alert and oriented to person, place, and time. She has normal reflexes. No cranial nerve deficit. She exhibits normal muscle tone. Coordination normal.  Skin: Skin is warm and dry. No rash noted.  Psychiatric: She has a normal mood and affect. Her behavior is normal.          Assessment & Plan:   Preventive health examination Chronic atrial fibrillation. We'll continue rate control and chronic Coumadin anticoagulation Hypertension stable Chronic venous insufficiency   Review of Systems As above    Objective:   Physical Exam  Musculoskeletal:   Status post right total replacement surgery  Marked osteoarthritic joint changes of the hands    As above      Assessment & Plan:   As above Will check updated labs Medications updated Low-salt diet regular exercise modest weight loss all recommended

## 2013-11-14 NOTE — Patient Instructions (Signed)
Continue to take 1 tablet all days except 1/2 tablet on Monday/Thursday.  Re-check in 4 weeks.  Anticoagulation Dose Instructions as of 11/14/2013     Dorene Grebe Tue Wed Thu Fri Sat   New Dose 2 mg 1 mg 2 mg 2 mg 1 mg 2 mg 2 mg    Description       Continue to take 1 tablet all days except 1/2 tablet on Monday/Thursday.  Re-check in 4 weeks.

## 2013-11-14 NOTE — Progress Notes (Signed)
Pre-visit discussion using our clinic review tool. No additional management support is needed unless otherwise documented below in the visit note.  

## 2013-11-15 LAB — LDL CHOLESTEROL, DIRECT: Direct LDL: 152.2 mg/dL

## 2013-11-15 LAB — TSH: TSH: 2.23 u[IU]/mL (ref 0.35–5.50)

## 2013-12-12 ENCOUNTER — Ambulatory Visit (INDEPENDENT_AMBULATORY_CARE_PROVIDER_SITE_OTHER): Payer: Medicare Other | Admitting: Family

## 2013-12-12 DIAGNOSIS — I4891 Unspecified atrial fibrillation: Secondary | ICD-10-CM

## 2013-12-12 DIAGNOSIS — Z7901 Long term (current) use of anticoagulants: Secondary | ICD-10-CM

## 2013-12-12 LAB — POCT INR: INR: 2.8

## 2013-12-12 NOTE — Patient Instructions (Signed)
Continue to take 1 tablet all days except 1/2 tablet on Monday/Thursday.  Re-check in 6 weeks.  Anticoagulation Dose Instructions as of 12/12/2013     Dorene Grebe Tue Wed Thu Fri Sat   New Dose 2 mg 1 mg 2 mg 2 mg 1 mg 2 mg 2 mg    Description       Continue to take 1 tablet all days except 1/2 tablet on Monday/Thursday.  Re-check in 6 weeks.

## 2014-01-24 ENCOUNTER — Ambulatory Visit (INDEPENDENT_AMBULATORY_CARE_PROVIDER_SITE_OTHER): Payer: Medicare Other | Admitting: General Practice

## 2014-01-24 DIAGNOSIS — I4891 Unspecified atrial fibrillation: Secondary | ICD-10-CM

## 2014-01-24 DIAGNOSIS — Z5181 Encounter for therapeutic drug level monitoring: Secondary | ICD-10-CM | POA: Insufficient documentation

## 2014-01-24 DIAGNOSIS — Z7901 Long term (current) use of anticoagulants: Secondary | ICD-10-CM

## 2014-01-24 LAB — POCT INR: INR: 2

## 2014-01-24 NOTE — Progress Notes (Signed)
Pre visit review using our clinic review tool, if applicable. No additional management support is needed unless otherwise documented below in the visit note. 

## 2014-02-19 ENCOUNTER — Encounter: Payer: Self-pay | Admitting: Internal Medicine

## 2014-02-19 ENCOUNTER — Ambulatory Visit (INDEPENDENT_AMBULATORY_CARE_PROVIDER_SITE_OTHER): Payer: Medicare Other | Admitting: Internal Medicine

## 2014-02-19 VITALS — BP 130/70 | HR 65 | Temp 98.7°F | Wt 157.0 lb

## 2014-02-19 DIAGNOSIS — I10 Essential (primary) hypertension: Secondary | ICD-10-CM

## 2014-02-19 DIAGNOSIS — R300 Dysuria: Secondary | ICD-10-CM

## 2014-02-19 DIAGNOSIS — N390 Urinary tract infection, site not specified: Secondary | ICD-10-CM

## 2014-02-19 DIAGNOSIS — Z7901 Long term (current) use of anticoagulants: Secondary | ICD-10-CM

## 2014-02-19 LAB — POCT URINALYSIS DIPSTICK
BILIRUBIN UA: NEGATIVE
Glucose, UA: NEGATIVE
KETONES UA: NEGATIVE
Nitrite, UA: NEGATIVE
Protein, UA: NEGATIVE
Spec Grav, UA: 1.01
Urobilinogen, UA: 0.2
pH, UA: 7.5

## 2014-02-19 MED ORDER — CIPROFLOXACIN HCL 500 MG PO TABS: 500.0000 mg | ORAL_TABLET | Freq: Two times a day (BID) | ORAL | Status: DC

## 2014-02-19 NOTE — Progress Notes (Signed)
Subjective:    Patient ID: Bonnie Roberts, female    DOB: 1922/04/13, 80 y.o.   MRN: [id number]  HPI 80 year old patient who is on chronic Coumadin anticoagulation has both penicillin and sulfa allergy.  She presents with a three-day history of urinary frequency and dysuria. Urinalysis was reviewed and did reveal some pyuria and hematuria.  No fever, chills, or flank pain  Past Medical History  Diagnosis Date  . ANXIETY DEPRESSION 12/11/2009  . Atrial fibrillation 04/10/2007  . COLONIC POLYPS, HX OF 04/10/2007  . DISORDER, MENOPAUSAL NOS 05/15/2007  . DYSPNEA 02/11/2009  . Edema 03/13/2009  . HYPERTENSION 04/10/2007  . Osteoarth NOS-Unspec 04/10/2007  . VENOUS INSUFFICIENCY, CHRONIC 01/22/2010    History   Social History  . Marital Status: Widowed    Spouse Name: N/A    Number of Children: 4  . Years of Education: N/A   Occupational History  . RETIRED    Social History Main Topics  . Smoking status: Never Smoker   . Smokeless tobacco: Never Used  . Alcohol Use: No  . Drug Use: No  . Sexual Activity: Not on file   Other Topics Concern  . Not on file   Social History Narrative  . No narrative on file    Past Surgical History  Procedure Laterality Date  . Cholecystectomy  1965  . Carpal tunnel release    . Abdominal hysterectomy  1959  . Total knee arthroplasty  1993    right  . Rectocele repair    . Bladder surgery      tacking  . Hernia repair    . Cardiac catheterization      History reviewed. No pertinent family history.  Allergies  Allergen Reactions  . Codeine     REACTION: n/v  . Nsaids     REACTION: gib  . Penicillins     REACTION: rash  . Sulfonamide Derivatives     REACTION: rash    Current Outpatient Prescriptions on File Prior to Visit  Medication Sig Dispense Refill  . atenolol (TENORMIN) 25 MG tablet TAKE 1 TABLET (25 MG TOTAL) BY MOUTH DAILY.  90 tablet  1  . b complex vitamins capsule Take 1 capsule by mouth daily.        Marland Kitchen diltiazem  (CARDIZEM CD) 120 MG 24 hr capsule TAKE 1 CAPSULE (120 MG TOTAL) BY MOUTH DAILY.  90 capsule  3  . Diphenhydramine-APAP, sleep, (TYLENOL PM EXTRA STRENGTH PO) Take 1 tablet by mouth as needed.      . docusate sodium (COLACE) 100 MG capsule Take 1 capsule (100 mg total) by mouth daily.  10 capsule    . Flaxseed, Linseed, (BL FLAX SEED OIL) 1000 MG CAPS Take by mouth daily.        . furosemide (LASIX) 40 MG tablet TAKE 1 TABLET (40 MG TOTAL) BY MOUTH 2 (TWO) TIMES DAILY.  180 tablet  3  . Multiple Vitamins-Minerals (CENTRUM SILVER PO) Take by mouth daily.        . Omega-3 Fatty Acids (FISH OIL) 1000 MG CAPS 2 TABS DAILY      . potassium chloride SA (K-DUR,KLOR-CON) 20 MEQ tablet TAKE 1 TABLET BY MOUTH TWICE A DAY  180 tablet  3  . traMADol (ULTRAM) 50 MG tablet Take 1 tablet (50 mg total) by mouth every 8 (eight) hours as needed.  90 tablet  3  . warfarin (COUMADIN) 2 MG tablet TAKE AS DIRECTED BY COUMADIN CLINIC.  90 tablet  1   No current facility-administered medications on file prior to visit.    BP 130/70  Pulse 65  Temp(Src) 98.7 F (37.1 C) (Oral)  Wt 157 lb (71.215 kg)  SpO2 96%       Review of Systems  Constitutional: Negative.  Negative for fever and chills.  HENT: Negative for congestion, dental problem, hearing loss, rhinorrhea, sinus pressure, sore throat and tinnitus.   Eyes: Negative for pain, discharge and visual disturbance.  Respiratory: Negative for cough and shortness of breath.   Cardiovascular: Negative for chest pain, palpitations and leg swelling.  Gastrointestinal: Negative for nausea, vomiting, abdominal pain, diarrhea, constipation, blood in stool and abdominal distention.  Genitourinary: Negative for dysuria, urgency, frequency, hematuria, flank pain, vaginal bleeding, vaginal discharge, difficulty urinating, vaginal pain and pelvic pain.  Musculoskeletal: Negative for arthralgias, gait problem and joint swelling.  Skin: Negative for rash.  Neurological:  Negative for dizziness, syncope, speech difficulty, weakness, numbness and headaches.  Hematological: Negative for adenopathy.  Psychiatric/Behavioral: Negative for behavioral problems, dysphoric mood and agitation. The patient is not nervous/anxious.        Objective:   Physical Exam  Constitutional: She appears well-developed and well-nourished. No distress.  Abdominal: Soft. Bowel sounds are normal. She exhibits no distension. There is no tenderness. There is no rebound and no guarding.          Assessment & Plan:   Acute UTI, will treat with Cipro for 3 days Chronic Coumadin anticoagulation.  Minimal drug drug interaction with 3 days of antibiotic therapy

## 2014-02-19 NOTE — Progress Notes (Signed)
Pre visit review using our clinic review tool, if applicable. No additional management support is needed unless otherwise documented below in the visit note. 

## 2014-02-19 NOTE — Patient Instructions (Signed)
Take your antibiotic as prescribed until ALL of it is gone, but stop if you develop a rash, swelling, or any side effects of the medication.  Contact our office as soon as possible if  there are side effects of the medication.  Drink as much fluid as you  can tolerate over the next few days

## 2014-03-01 ENCOUNTER — Encounter: Payer: Self-pay | Admitting: Internal Medicine

## 2014-03-01 ENCOUNTER — Ambulatory Visit (INDEPENDENT_AMBULATORY_CARE_PROVIDER_SITE_OTHER): Payer: Medicare Other | Admitting: Internal Medicine

## 2014-03-01 VITALS — BP 130/80 | Temp 98.5°F | Wt 158.0 lb

## 2014-03-01 DIAGNOSIS — I10 Essential (primary) hypertension: Secondary | ICD-10-CM

## 2014-03-01 DIAGNOSIS — Z5181 Encounter for therapeutic drug level monitoring: Secondary | ICD-10-CM

## 2014-03-01 DIAGNOSIS — N390 Urinary tract infection, site not specified: Secondary | ICD-10-CM

## 2014-03-01 DIAGNOSIS — R319 Hematuria, unspecified: Secondary | ICD-10-CM

## 2014-03-01 LAB — POCT URINALYSIS DIPSTICK
Bilirubin, UA: NEGATIVE
Blood, UA: 3
GLUCOSE UA: NEGATIVE
Ketones, UA: NEGATIVE
NITRITE UA: NEGATIVE
Protein, UA: 1
Spec Grav, UA: 1.01
Urobilinogen, UA: 0.2
pH, UA: 5.5

## 2014-03-01 MED ORDER — NITROFURANTOIN MONOHYD MACRO 100 MG PO CAPS: 100.0000 mg | ORAL_CAPSULE | Freq: Two times a day (BID) | ORAL | Status: DC

## 2014-03-01 NOTE — Patient Instructions (Signed)
Drink as much fluid as you  can tolerate over the next few days  Take your antibiotic as prescribed until ALL of it is gone, but stop if you develop a rash, swelling, or any side effects of the medication.  Contact our office as soon as possible if  there are side effects of the medication.   

## 2014-03-01 NOTE — Progress Notes (Signed)
Pre visit review using our clinic review tool, if applicable. No additional management support is needed unless otherwise documented below in the visit note. 

## 2014-03-01 NOTE — Progress Notes (Signed)
Subjective:    Patient ID: Bonnie Roberts, female    DOB: [birth year], 80 y.o.   MRN: [id number]  HPI 80 year old patient who was seen 10 days ago and treated for asymptomatic UTI.  She has an allergy to both penicillin and sulfa and has been on Coumadin anticoagulation.  She was treated with a 3 day course of Cipro and had an additional nice clinical response.  Yesterday she had the onset of burning, dysuria.  Repeat UA today revealed recurrent pyuria  Past Medical History  Diagnosis Date  . ANXIETY DEPRESSION 12/11/2009  . Atrial fibrillation 04/10/2007  . COLONIC POLYPS, HX OF 04/10/2007  . DISORDER, MENOPAUSAL NOS 05/15/2007  . DYSPNEA 02/11/2009  . Edema 03/13/2009  . HYPERTENSION 04/10/2007  . Osteoarth NOS-Unspec 04/10/2007  . VENOUS INSUFFICIENCY, CHRONIC 01/22/2010    History   Social History  . Marital Status: Widowed    Spouse Name: N/A    Number of Children: 4  . Years of Education: N/A   Occupational History  . RETIRED    Social History Main Topics  . Smoking status: Never Smoker   . Smokeless tobacco: Never Used  . Alcohol Use: No  . Drug Use: No  . Sexual Activity: Not on file   Other Topics Concern  . Not on file   Social History Narrative  . No narrative on file    Past Surgical History  Procedure Laterality Date  . Cholecystectomy  1965  . Carpal tunnel release    . Abdominal hysterectomy  1959  . Total knee arthroplasty  1993    right  . Rectocele repair    . Bladder surgery      tacking  . Hernia repair    . Cardiac catheterization      History reviewed. No pertinent family history.  Allergies  Allergen Reactions  . Codeine     REACTION: n/v  . Nsaids     REACTION: gib  . Penicillins     REACTION: rash  . Sulfonamide Derivatives     REACTION: rash    Current Outpatient Prescriptions on File Prior to Visit  Medication Sig Dispense Refill  . atenolol (TENORMIN) 25 MG tablet TAKE 1 TABLET (25 MG TOTAL) BY MOUTH DAILY.  90 tablet  1  .  b complex vitamins capsule Take 1 capsule by mouth daily.        Marland Kitchen diltiazem (CARDIZEM CD) 120 MG 24 hr capsule TAKE 1 CAPSULE (120 MG TOTAL) BY MOUTH DAILY.  90 capsule  3  . Diphenhydramine-APAP, sleep, (TYLENOL PM EXTRA STRENGTH PO) Take 1 tablet by mouth as needed.      . docusate sodium (COLACE) 100 MG capsule Take 1 capsule (100 mg total) by mouth daily.  10 capsule    . Flaxseed, Linseed, (BL FLAX SEED OIL) 1000 MG CAPS Take by mouth daily.        . furosemide (LASIX) 40 MG tablet TAKE 1 TABLET (40 MG TOTAL) BY MOUTH 2 (TWO) TIMES DAILY.  180 tablet  3  . Multiple Vitamins-Minerals (CENTRUM SILVER PO) Take by mouth daily.        . Omega-3 Fatty Acids (FISH OIL) 1000 MG CAPS 2 TABS DAILY      . potassium chloride SA (K-DUR,KLOR-CON) 20 MEQ tablet TAKE 1 TABLET BY MOUTH TWICE A DAY  180 tablet  3  . traMADol (ULTRAM) 50 MG tablet Take 1 tablet (50 mg total) by mouth every 8 (eight) hours as needed.  90 tablet  3  . warfarin (COUMADIN) 2 MG tablet TAKE AS DIRECTED BY COUMADIN CLINIC.  90 tablet  1   No current facility-administered medications on file prior to visit.    BP 130/80  Temp(Src) 98.5 F (36.9 C) (Oral)  Wt 158 lb (71.668 kg)      Review of Systems  Constitutional: Negative.   HENT: Negative for congestion, dental problem, hearing loss, rhinorrhea, sinus pressure, sore throat and tinnitus.   Eyes: Negative for pain, discharge and visual disturbance.  Respiratory: Negative for cough and shortness of breath.   Cardiovascular: Negative for chest pain, palpitations and leg swelling.  Gastrointestinal: Negative for nausea, vomiting, abdominal pain, diarrhea, constipation, blood in stool and abdominal distention.  Genitourinary: Positive for dysuria and frequency. Negative for urgency, hematuria, flank pain, vaginal bleeding, vaginal discharge, difficulty urinating, vaginal pain and pelvic pain.  Musculoskeletal: Negative for arthralgias, gait problem and joint swelling.    Skin: Negative for rash.  Neurological: Negative for dizziness, syncope, speech difficulty, weakness, numbness and headaches.  Hematological: Negative for adenopathy.  Psychiatric/Behavioral: Negative for behavioral problems, dysphoric mood and agitation. The patient is not nervous/anxious.        Objective:   Physical Exam  Constitutional: She appears well-developed and well-nourished. No distress.          Assessment & Plan:    Recurrent UTI-  Rx macrodantin; check urine C&S

## 2014-03-04 LAB — URINE CULTURE

## 2014-03-06 ENCOUNTER — Telehealth: Payer: Self-pay | Admitting: Internal Medicine

## 2014-03-06 NOTE — Telephone Encounter (Signed)
Spoke to pt told her urine culture positive for infection and she is on the correct antibiotic and to make sure finish all of the antibiotic. Pt verbalized understanding. Asked pt if having any symptoms now. Pt said no, burning is gone. Told pt okay if symptoms start again please call office. Pt verbalized understanding.

## 2014-03-06 NOTE — Telephone Encounter (Signed)
Pt would like results of culture done 5/22. pls call

## 2014-03-07 ENCOUNTER — Ambulatory Visit (INDEPENDENT_AMBULATORY_CARE_PROVIDER_SITE_OTHER): Payer: Medicare Other | Admitting: Family

## 2014-03-07 DIAGNOSIS — Z5181 Encounter for therapeutic drug level monitoring: Secondary | ICD-10-CM

## 2014-03-07 DIAGNOSIS — I4891 Unspecified atrial fibrillation: Secondary | ICD-10-CM

## 2014-03-07 LAB — POCT INR: INR: 2

## 2014-03-07 NOTE — Patient Instructions (Signed)
Continue to take 1 tablet all days except 1/2 tablet on Monday/Thursday.  Re-check in 6 weeks.  Anticoagulation Dose Instructions as of 03/07/2014     Dorene Grebe Tue Wed Thu Fri Sat   New Dose 2 mg 1 mg 2 mg 2 mg 1 mg 2 mg 2 mg    Description       Continue to take 1 tablet all days except 1/2 tablet on Monday/Thursday.  Re-check in 6 weeks.

## 2014-03-14 ENCOUNTER — Encounter: Payer: Self-pay | Admitting: Cardiovascular Disease

## 2014-03-14 ENCOUNTER — Ambulatory Visit (INDEPENDENT_AMBULATORY_CARE_PROVIDER_SITE_OTHER): Payer: Medicare Other | Admitting: Cardiovascular Disease

## 2014-03-14 VITALS — BP 169/80 | HR 80 | Ht 61.0 in | Wt 160.0 lb

## 2014-03-14 DIAGNOSIS — I4891 Unspecified atrial fibrillation: Secondary | ICD-10-CM

## 2014-03-14 DIAGNOSIS — I872 Venous insufficiency (chronic) (peripheral): Secondary | ICD-10-CM

## 2014-03-14 DIAGNOSIS — Z7901 Long term (current) use of anticoagulants: Secondary | ICD-10-CM

## 2014-03-14 DIAGNOSIS — I10 Essential (primary) hypertension: Secondary | ICD-10-CM

## 2014-03-14 NOTE — Assessment & Plan Note (Signed)
Discussed novel agents  She prefers coumadin  Discussed fact that pradaxa will have monoclonal antidote soon  Will reassess next visit

## 2014-03-14 NOTE — Progress Notes (Signed)
Patient ID: Bonnie Roberts, female   DOB: 22-Jun-1922, 80 y.o.   MRN: [id number] 80 year-old patient who is seen today for follow-up. She has a history of chronic atrial fibrillation on rate control and anticoagulation. Coumadin therapy. She has DJD and a history of well-controlled hypertension. She does remarkably well. She denies any cardiopulmonary complaints. She has had a recent cardiology visit. Her most recent INR was 2.2. She has mild chronic venous insufficiency and edema Edema is worse despite low sodium and support hose. Her arthritis prevents her from putting compression stockings on. Still driving has a 49 some odd yo daughter that she lives with and helps since she is bipolar.  Myovue: 08/21/12 Overall Impression: Low risk stress nuclear study. There is a very small area of reversible ischemia at the apex ( SDS = 4). There perfusion in the other areas is normal.  LV Ejection Fraction: Study not gated.  Doing well saw Dr Debara Pickett for veins in legs but decided to use support hose instead  Large hiatal hernia with some reflux    ROS: Denies fever, malais, weight loss, blurry vision, decreased visual acuity, cough, sputum, SOB, hemoptysis, pleuritic pain, palpitaitons, heartburn, abdominal pain, melena, lower extremity edema, claudication, or rash.  All other systems reviewed and negative  General: Affect appropriate Healthy:  appears stated age 28: normal Neck supple with no adenopathy JVP normal no bruits no thyromegaly Lungs clear with no wheezing and good diaphragmatic motion Heart:  S1/S2 no murmur, no rub, gallop or click PMI normal Abdomen: benighn, BS positve, no tenderness, no AAA no bruit.  No HSM or HJR Distal pulses intact with no bruits Plus bilateral edema Neuro non-focal Skin warm and dry No muscular weakness   Current Outpatient Prescriptions  Medication Sig Dispense Refill  . atenolol (TENORMIN) 25 MG tablet TAKE 1 TABLET (25 MG TOTAL) BY MOUTH DAILY.  90  tablet  1  . b complex vitamins capsule Take 1 capsule by mouth daily.        Marland Kitchen diltiazem (CARDIZEM CD) 120 MG 24 hr capsule TAKE 1 CAPSULE (120 MG TOTAL) BY MOUTH DAILY.  90 capsule  3  . Diphenhydramine-APAP, sleep, (TYLENOL PM EXTRA STRENGTH PO) Take 1 tablet by mouth as needed.      . docusate sodium (COLACE) 100 MG capsule Take 1 capsule (100 mg total) by mouth daily.  10 capsule    . Flaxseed, Linseed, (BL FLAX SEED OIL) 1000 MG CAPS Take by mouth daily.        . furosemide (LASIX) 40 MG tablet TAKE 1 TABLET (40 MG TOTAL) BY MOUTH 2 (TWO) TIMES DAILY.  180 tablet  3  . Multiple Vitamins-Minerals (CENTRUM SILVER PO) Take by mouth daily.        . nitrofurantoin, macrocrystal-monohydrate, (MACROBID) 100 MG capsule Take 1 capsule (100 mg total) by mouth 2 (two) times daily.  14 capsule  0  . Omega-3 Fatty Acids (FISH OIL) 1000 MG CAPS 2 TABS DAILY      . potassium chloride SA (K-DUR,KLOR-CON) 20 MEQ tablet TAKE 1 TABLET BY MOUTH TWICE A DAY  180 tablet  3  . traMADol (ULTRAM) 50 MG tablet Take 1 tablet (50 mg total) by mouth every 8 (eight) hours as needed.  90 tablet  3  . warfarin (COUMADIN) 2 MG tablet TAKE AS DIRECTED BY COUMADIN CLINIC.  90 tablet  1   No current facility-administered medications for this visit.    Allergies  Codeine; Nsaids; Penicillins; and Sulfonamide  derivatives  Electrocardiogram:  afib right axis  Arte 81   Assessment and Plan

## 2014-03-14 NOTE — Assessment & Plan Note (Signed)
Well controlled.  Continue current medications and low sodium Dash type diet.    

## 2014-03-14 NOTE — Patient Instructions (Signed)
Your physician wants you to follow-up in: YEAR WITH DR NISHAN  You will receive a reminder letter in the mail two months in advance. If you don't receive a letter, please call our office to schedule the follow-up appointment.  Your physician recommends that you continue on your current medications as directed. Please refer to the Current Medication list given to you today. 

## 2014-03-14 NOTE — Assessment & Plan Note (Signed)
Continue lasix and low sodium diet stable

## 2014-03-14 NOTE — Assessment & Plan Note (Signed)
Good rate control and anticoagulation Prefers to be on coumadin.  No bleeding issues

## 2014-04-05 ENCOUNTER — Other Ambulatory Visit: Payer: Self-pay | Admitting: Internal Medicine

## 2014-04-18 ENCOUNTER — Ambulatory Visit (INDEPENDENT_AMBULATORY_CARE_PROVIDER_SITE_OTHER): Payer: Medicare Other | Admitting: General Practice

## 2014-04-18 DIAGNOSIS — I4891 Unspecified atrial fibrillation: Secondary | ICD-10-CM

## 2014-04-18 DIAGNOSIS — Z5181 Encounter for therapeutic drug level monitoring: Secondary | ICD-10-CM

## 2014-04-18 LAB — POCT INR: INR: 2.2

## 2014-04-18 NOTE — Progress Notes (Signed)
Pre visit review using our clinic review tool, if applicable. No additional management support is needed unless otherwise documented below in the visit note. 

## 2014-05-23 ENCOUNTER — Encounter: Payer: Self-pay | Admitting: Family Medicine

## 2014-05-23 ENCOUNTER — Ambulatory Visit (INDEPENDENT_AMBULATORY_CARE_PROVIDER_SITE_OTHER): Payer: Medicare Other | Admitting: Family Medicine

## 2014-05-23 ENCOUNTER — Ambulatory Visit (INDEPENDENT_AMBULATORY_CARE_PROVIDER_SITE_OTHER): Payer: Medicare Other | Admitting: Family

## 2014-05-23 VITALS — BP 140/80 | HR 68 | Temp 98.3°F | Ht 61.0 in | Wt 158.0 lb

## 2014-05-23 DIAGNOSIS — R319 Hematuria, unspecified: Secondary | ICD-10-CM

## 2014-05-23 DIAGNOSIS — I4891 Unspecified atrial fibrillation: Secondary | ICD-10-CM

## 2014-05-23 DIAGNOSIS — R300 Dysuria: Secondary | ICD-10-CM

## 2014-05-23 DIAGNOSIS — Z5181 Encounter for therapeutic drug level monitoring: Secondary | ICD-10-CM

## 2014-05-23 LAB — POCT URINALYSIS DIPSTICK
Bilirubin, UA: NEGATIVE
Blood, UA: POSITIVE
Glucose, UA: NEGATIVE
KETONES UA: NEGATIVE
Nitrite, UA: NEGATIVE
PROTEIN UA: NEGATIVE
SPEC GRAV UA: 1.01
UROBILINOGEN UA: NEGATIVE
pH, UA: 7

## 2014-05-23 LAB — URINALYSIS, MICROSCOPIC ONLY

## 2014-05-23 LAB — CBC
HCT: 43.6 % (ref 36.0–46.0)
HEMOGLOBIN: 14.5 g/dL (ref 12.0–15.0)
MCHC: 33.3 g/dL (ref 30.0–36.0)
MCV: 94.8 fl (ref 78.0–100.0)
Platelets: 190 10*3/uL (ref 150.0–400.0)
RBC: 4.6 Mil/uL (ref 3.87–5.11)
RDW: 15.1 % (ref 11.5–15.5)
WBC: 7.9 10*3/uL (ref 4.0–10.5)

## 2014-05-23 LAB — BASIC METABOLIC PANEL
BUN: 26 mg/dL — AB (ref 6–23)
CALCIUM: 9.5 mg/dL (ref 8.4–10.5)
CO2: 32 meq/L (ref 19–32)
CREATININE: 1.1 mg/dL (ref 0.4–1.2)
Chloride: 99 mEq/L (ref 96–112)
GFR: 50.16 mL/min — AB (ref >60.00)
GLUCOSE: 94 mg/dL (ref 70–99)
Potassium: 4.5 mEq/L (ref 3.5–5.1)
Sodium: 141 mEq/L (ref 135–145)

## 2014-05-23 LAB — POCT INR: INR: 1.9

## 2014-05-23 MED ORDER — CEPHALEXIN 500 MG PO CAPS: 500.0000 mg | ORAL_CAPSULE | Freq: Four times a day (QID) | ORAL | Status: DC

## 2014-05-23 NOTE — Progress Notes (Signed)
Garret Reddish, MD Phone: [phone]  Subjective:   Bonnie Roberts is a 80 y.o. year old very pleasant female patient who presents with the following:  Dysuria/hematuria Noted burning with peeing with first urination this morning. Later in the morning she noted small red tint to urine. Patient has a history of UTI with last episode being near Father's Day. She says this feels very similar to previous episode. She's not tried anything. Symptoms stable in course. Of note patient is on Coumadin iR has been therapeutic and she has been stable on current Coumadin dose for some time. ROS-denies increased urinary frequency or urgency  Past Medical History- anxiety, depression, HTN, Atrial fibrillation (on coumadin)  Medications- reviewed and updated Current Outpatient Prescriptions  Medication Sig Dispense Refill  . atenolol (TENORMIN) 25 MG tablet TAKE 1 TABLET (25 MG TOTAL) BY MOUTH DAILY.  90 tablet  0  . b complex vitamins capsule Take 1 capsule by mouth daily.        Marland Kitchen diltiazem (CARDIZEM CD) 120 MG 24 hr capsule TAKE 1 CAPSULE (120 MG TOTAL) BY MOUTH DAILY.  90 capsule  3  . Diphenhydramine-APAP, sleep, (TYLENOL PM EXTRA STRENGTH PO) Take 1 tablet by mouth as needed.      . docusate sodium (COLACE) 100 MG capsule Take 1 capsule (100 mg total) by mouth daily.  10 capsule    . Flaxseed, Linseed, (BL FLAX SEED OIL) 1000 MG CAPS Take by mouth daily.        . furosemide (LASIX) 40 MG tablet TAKE 1 TABLET (40 MG TOTAL) BY MOUTH 2 (TWO) TIMES DAILY.  180 tablet  3  . Multiple Vitamins-Minerals (CENTRUM SILVER PO) Take by mouth daily.        . Omega-3 Fatty Acids (FISH OIL) 1000 MG CAPS 2 TABS DAILY      . potassium chloride SA (K-DUR,KLOR-CON) 20 MEQ tablet TAKE 1 TABLET BY MOUTH TWICE A DAY  180 tablet  3  . warfarin (COUMADIN) 2 MG tablet TAKE AS DIRECTED BY COUMADIN CLINIC.  90 tablet  1  2mg  5 days a week, 1/2 pill 2 days a week  Objective: BP 140/80  Pulse 68  Temp(Src) 98.3 F  (36.8 C)  Ht 5\' 1"  (1.549 m)  Wt 158 lb (71.668 kg)  BMI 29.87 kg/m2 Gen: NAD, resting comfortably Abdomen: No suprapubic tenderness. MSK noCVA tenderness tenderness. Ext: no edema  Results for orders placed in visit on 05/23/14 (from the past 24 hour(s))  POCT URINALYSIS DIPSTICK     Status: None   Collection Time    05/23/14 11:35 AM      Result Value Ref Range   Color, UA yellow     Clarity, UA clear     Glucose, UA neg     Bilirubin, UA neg     Ketones, UA neg     Spec Grav, UA 1.010     Blood, UA positive     pH, UA 7.0     Protein, UA neg     Urobilinogen, UA negative     Nitrite, UA neg     Leukocytes, UA Trace    POCT INR     Status: None   Collection Time    05/23/14 12:04 PM      Result Value Ref Range   INR 1.9      Assessment/Plan:  UTI Trace leukocytes and blood in urine with typical symptoms for patient. Sent for culture. start on Keflex. Sent UA for  microscopic due to blood. INR today stable at 1.9 continue current dose recheck in one month. Check bmet given hematuria. Check CBC to make sure no anemia. Has follow up with Roxy Cedar in one week.   Orders Placed This Encounter  Procedures  . Urine culture  . CBC  . Basic metabolic panel    [place]  . Urine Microscopic  . POC Urinalysis Dipstick    Meds ordered this encounter  Medications  . cephALEXin (KEFLEX) 500 MG capsule    Sig: Take 1 capsule (500 mg total) by mouth 4 (four) times daily.    Dispense:  28 capsule    Refill:  0   patient did have a rash in the past with penicillin. I gave the patient warning signs of allergic reaction.

## 2014-05-23 NOTE — Patient Instructions (Addendum)
Take 1 tablet today then Continue to take 1 tablet all days except 1/2 tablet on Monday/Thursday.  Re-check in 1 week and check urine. Pt will start an abx today and would like to recheck next week   Anticoagulation Dose Instructions as of 05/23/2014     Dorene Grebe Tue Wed Thu Fri Sat   New Dose 2 mg 1 mg 2 mg 2 mg 1 mg 2 mg 2 mg    Description       Take 1 tablet today then Continue to take 1 tablet all days except 1/2 tablet on Monday/Thursday.  Re-check in 1 week.

## 2014-05-23 NOTE — Patient Instructions (Addendum)
UTI  Suspect this is a UTI. Treat with Keflex for 7 days. Check urine for bacteria through culture (will call if need to change antibiotic)  Also check blood counts, kidney function, INR to make sure your coumadin levels are ok.   We need to see you back in about 2 weeks to make sure that you are not having urine in your blood.

## 2014-05-26 LAB — URINE CULTURE: Colony Count: 70000

## 2014-05-28 ENCOUNTER — Telehealth: Payer: Self-pay | Admitting: Internal Medicine

## 2014-05-28 NOTE — Telephone Encounter (Signed)
No not as long as she takes the medication until it is finished and symptoms resolve

## 2014-05-28 NOTE — Telephone Encounter (Signed)
Pt wants to know if she should get another urine to ensure the infection is gone

## 2014-05-28 NOTE — Telephone Encounter (Signed)
Dr. Hunter please advise. 

## 2014-05-29 NOTE — Telephone Encounter (Signed)
Pt.notified

## 2014-05-30 ENCOUNTER — Ambulatory Visit: Payer: Medicare Other | Admitting: Family

## 2014-05-30 ENCOUNTER — Ambulatory Visit: Payer: Medicare Other

## 2014-06-03 ENCOUNTER — Ambulatory Visit (INDEPENDENT_AMBULATORY_CARE_PROVIDER_SITE_OTHER): Payer: Medicare Other | Admitting: Family

## 2014-06-03 DIAGNOSIS — I482 Chronic atrial fibrillation, unspecified: Secondary | ICD-10-CM

## 2014-06-03 DIAGNOSIS — Z5181 Encounter for therapeutic drug level monitoring: Secondary | ICD-10-CM

## 2014-06-03 DIAGNOSIS — I4891 Unspecified atrial fibrillation: Secondary | ICD-10-CM

## 2014-06-03 LAB — POCT INR: INR: 2.2

## 2014-06-03 NOTE — Patient Instructions (Signed)
Continue to take 1 tablet all days except 1/2 tablet on Monday/Thursday.  Re-check in 4 week.  Anticoagulation Dose Instructions as of 06/03/2014     Bonnie Roberts Tue Wed Thu Fri Sat   New Dose 2 mg 1 mg 2 mg 2 mg 1 mg 2 mg 2 mg    Description       Continue to take 1 tablet all days except 1/2 tablet on Monday/Thursday.  Re-check in 4 week.

## 2014-06-07 ENCOUNTER — Ambulatory Visit: Payer: Medicare Other | Admitting: Family

## 2014-06-13 ENCOUNTER — Other Ambulatory Visit: Payer: Self-pay | Admitting: Internal Medicine

## 2014-06-25 ENCOUNTER — Other Ambulatory Visit: Payer: Self-pay | Admitting: Internal Medicine

## 2014-06-27 ENCOUNTER — Ambulatory Visit (INDEPENDENT_AMBULATORY_CARE_PROVIDER_SITE_OTHER): Payer: Medicare Other | Admitting: Family

## 2014-06-27 DIAGNOSIS — I4891 Unspecified atrial fibrillation: Secondary | ICD-10-CM

## 2014-06-27 DIAGNOSIS — I482 Chronic atrial fibrillation, unspecified: Secondary | ICD-10-CM

## 2014-06-27 DIAGNOSIS — Z5181 Encounter for therapeutic drug level monitoring: Secondary | ICD-10-CM

## 2014-06-27 LAB — POCT INR: INR: 2

## 2014-06-27 NOTE — Patient Instructions (Signed)
Continue to take 1 tablet all days except 1/2 tablet on Monday/Thursday.  Re-check in 4 week. Anticoagulation Dose Instructions as of 06/27/2014     Dorene Grebe Tue Wed Thu Fri Sat   New Dose 2 mg 1 mg 2 mg 2 mg 1 mg 2 mg 2 mg    Description       Continue to take 1 tablet all days except 1/2 tablet on Monday/Thursday.  Re-check in 4 week.

## 2014-07-01 ENCOUNTER — Ambulatory Visit: Payer: Medicare Other | Admitting: Family

## 2014-07-04 ENCOUNTER — Ambulatory Visit: Payer: Medicare Other | Admitting: Family

## 2014-07-10 ENCOUNTER — Other Ambulatory Visit: Payer: Self-pay | Admitting: Internal Medicine

## 2014-07-24 ENCOUNTER — Ambulatory Visit (INDEPENDENT_AMBULATORY_CARE_PROVIDER_SITE_OTHER): Payer: Medicare Other | Admitting: Internal Medicine

## 2014-07-24 ENCOUNTER — Ambulatory Visit (INDEPENDENT_AMBULATORY_CARE_PROVIDER_SITE_OTHER): Payer: Medicare Other | Admitting: Family

## 2014-07-24 ENCOUNTER — Encounter: Payer: Self-pay | Admitting: Internal Medicine

## 2014-07-24 VITALS — BP 150/80 | HR 61 | Temp 97.6°F | Resp 20 | Ht 61.0 in | Wt 154.0 lb

## 2014-07-24 DIAGNOSIS — Z7901 Long term (current) use of anticoagulants: Secondary | ICD-10-CM

## 2014-07-24 DIAGNOSIS — I872 Venous insufficiency (chronic) (peripheral): Secondary | ICD-10-CM

## 2014-07-24 DIAGNOSIS — I482 Chronic atrial fibrillation, unspecified: Secondary | ICD-10-CM

## 2014-07-24 DIAGNOSIS — K219 Gastro-esophageal reflux disease without esophagitis: Secondary | ICD-10-CM

## 2014-07-24 DIAGNOSIS — Z5181 Encounter for therapeutic drug level monitoring: Secondary | ICD-10-CM

## 2014-07-24 LAB — POCT INR: INR: 2.1

## 2014-07-24 MED ORDER — OMEPRAZOLE 20 MG PO CPDR: 20.0000 mg | DELAYED_RELEASE_CAPSULE | Freq: Every day | ORAL | Status: DC

## 2014-07-24 NOTE — Patient Instructions (Signed)
Continue to take 1 tablet all days except 1/2 tablet on Monday/Thursday.  Re-check in 6 week.  Anticoagulation Dose Instructions as of 07/24/2014     Bonnie Roberts Tue Wed Thu Fri Sat   New Dose 2 mg 1 mg 2 mg 2 mg 1 mg 2 mg 2 mg    Description       Continue to take 1 tablet all days except 1/2 tablet on Monday/Thursday.  Re-check in 6 week.

## 2014-07-24 NOTE — Progress Notes (Signed)
Subjective:    Patient ID: Bonnie Roberts, female    DOB: [birth year], 80 y.o.   MRN: [id number]  HPI 80 year old patient who has a long history of a large hiatal hernia.  More recently, she complains of worsening reflux symptoms.  She remains on chronic Coumadin anticoagulation for chronic atrial fibrillation.  She has chronic venous insufficiency and treated hypertension.  She is followed by cardiology  Past Medical History  Diagnosis Date  . ANXIETY DEPRESSION 12/11/2009  . Atrial fibrillation 04/10/2007  . COLONIC POLYPS, HX OF 04/10/2007  . DISORDER, MENOPAUSAL NOS 05/15/2007  . DYSPNEA 02/11/2009  . Edema 03/13/2009  . HYPERTENSION 04/10/2007  . Osteoarth NOS-Unspec 04/10/2007  . VENOUS INSUFFICIENCY, CHRONIC 01/22/2010    History   Social History  . Marital Status: Widowed    Spouse Name: N/A    Number of Children: 4  . Years of Education: N/A   Occupational History  . RETIRED    Social History Main Topics  . Smoking status: Never Smoker   . Smokeless tobacco: Never Used  . Alcohol Use: No  . Drug Use: No  . Sexual Activity: Not on file   Other Topics Concern  . Not on file   Social History Narrative  . No narrative on file    Past Surgical History  Procedure Laterality Date  . Cholecystectomy  1965  . Carpal tunnel release    . Abdominal hysterectomy  1959  . Total knee arthroplasty  1993    right  . Rectocele repair    . Bladder surgery      tacking  . Hernia repair    . Cardiac catheterization      History reviewed. No pertinent family history.  Allergies  Allergen Reactions  . Codeine     REACTION: n/v  . Nsaids     REACTION: gib  . Penicillins     REACTION: rash  . Sulfonamide Derivatives     REACTION: rash    Current Outpatient Prescriptions on File Prior to Visit  Medication Sig Dispense Refill  . atenolol (TENORMIN) 25 MG tablet TAKE 1 TABLET (25 MG TOTAL) BY MOUTH DAILY.  90 tablet  0  . b complex vitamins capsule Take 1 capsule by  mouth daily.        Marland Kitchen diltiazem (CARDIZEM CD) 120 MG 24 hr capsule TAKE 1 CAPSULE (120 MG TOTAL) BY MOUTH DAILY.  90 capsule  3  . Diphenhydramine-APAP, sleep, (TYLENOL PM EXTRA STRENGTH PO) Take 1 tablet by mouth as needed.      . docusate sodium (COLACE) 100 MG capsule Take 1 capsule (100 mg total) by mouth daily.  10 capsule    . Flaxseed, Linseed, (BL FLAX SEED OIL) 1000 MG CAPS Take by mouth daily.        . furosemide (LASIX) 40 MG tablet TAKE 1 TABLET (40 MG TOTAL) BY MOUTH 2 (TWO) TIMES DAILY.  180 tablet  3  . KLOR-CON M20 20 MEQ tablet TAKE 1 TABLET BY MOUTH TWICE A DAY  180 tablet  1  . Multiple Vitamins-Minerals (CENTRUM SILVER PO) Take by mouth daily.        . Omega-3 Fatty Acids (FISH OIL) 1000 MG CAPS 2 TABS DAILY      . warfarin (COUMADIN) 2 MG tablet TAKE AS DIRECTED BY COUMADIN CLINIC.  90 tablet  1   No current facility-administered medications on file prior to visit.    BP 150/80  Pulse 61  Temp(Src) 97.6  F (36.4 C) (Oral)  Resp 20  Ht 5\' 1"  (1.549 m)  Wt 154 lb (69.854 kg)  BMI 29.11 kg/m2  SpO2 98%      Review of Systems  Constitutional: Negative.   HENT: Negative for congestion, dental problem, hearing loss, rhinorrhea, sinus pressure, sore throat and tinnitus.   Eyes: Negative for pain, discharge and visual disturbance.  Respiratory: Negative for cough and shortness of breath.   Cardiovascular: Negative for chest pain, palpitations and leg swelling.  Gastrointestinal: Negative for nausea, vomiting, abdominal pain, diarrhea, constipation, blood in stool and abdominal distention.  Genitourinary: Negative for dysuria, urgency, frequency, hematuria, flank pain, vaginal bleeding, vaginal discharge, difficulty urinating, vaginal pain and pelvic pain.  Musculoskeletal: Negative for arthralgias, gait problem and joint swelling.  Skin: Negative for rash.  Neurological: Negative for dizziness, syncope, speech difficulty, weakness, numbness and headaches.    Hematological: Negative for adenopathy.  Psychiatric/Behavioral: Negative for behavioral problems, dysphoric mood and agitation. The patient is not nervous/anxious.        Objective:   Physical Exam  Constitutional: She is oriented to person, place, and time. She appears well-developed and well-nourished.  HENT:  Head: Normocephalic.  Right Ear: External ear normal.  Left Ear: External ear normal.  Mouth/Throat: Oropharynx is clear and moist.  Eyes: Conjunctivae and EOM are normal. Pupils are equal, round, and reactive to light.  Neck: Normal range of motion. Neck supple. No thyromegaly present.  Cardiovascular: Normal rate and normal heart sounds.   Irregular rhythm with controlled ventricular response Dorsalis pedis pulses full  Pulmonary/Chest: Effort normal and breath sounds normal.  Abdominal: Soft. Bowel sounds are normal. She exhibits no mass. There is no tenderness.  Musculoskeletal: Normal range of motion. She exhibits edema.  More prominent left ankle edema  Lymphadenopathy:    She has no cervical adenopathy.  Neurological: She is alert and oriented to person, place, and time.  Skin: Skin is warm and dry. No rash noted.  Psychiatric: She has a normal mood and affect. Her behavior is normal.          Assessment & Plan:   Large hilar hernia with reflux symptoms.  We'll place on a reflux diet in place on PPI therapy Hypertension stable Chronic atrial fibrillation

## 2014-07-24 NOTE — Patient Instructions (Addendum)
Avoids foods high in acid such as tomatoes citrus juices, and spicy foods.  Avoid eating within two hours of lying down or before exercising.  Do not overheat.  Try smaller more frequent meals.   Return in 6 months for follow-up Food Choices for Gastroesophageal Reflux Disease When you have gastroesophageal reflux disease (GERD), the foods you eat and your eating habits are very important. Choosing the right foods can help ease the discomfort of GERD. WHAT GENERAL GUIDELINES DO I NEED TO FOLLOW?  Choose fruits, vegetables, whole grains, low-fat dairy products, and low-fat meat, fish, and poultry.  Limit fats such as oils, salad dressings, butter, nuts, and avocado.  Keep a food diary to identify foods that cause symptoms.  Avoid foods that cause reflux. These may be different for different people.  Eat frequent small meals instead of three large meals each day.  Eat your meals slowly, in a relaxed setting.  Limit fried foods.  Cook foods using methods other than frying.  Avoid drinking alcohol.  Avoid drinking large amounts of liquids with your meals.  Avoid bending over or lying down until 2-3 hours after eating. WHAT FOODS ARE NOT RECOMMENDED? The following are some foods and drinks that may worsen your symptoms: Vegetables Tomatoes. Tomato juice. Tomato and spaghetti sauce. Chili peppers. Onion and garlic. Horseradish. Fruits Oranges, grapefruit, and lemon (fruit and juice). Meats High-fat meats, fish, and poultry. This includes hot dogs, ribs, ham, sausage, salami, and bacon. Dairy Whole milk and chocolate milk. Sour cream. Cream. Butter. Ice cream. Cream cheese.  Beverages Coffee and tea, with or without caffeine. Carbonated beverages or energy drinks. Condiments Hot sauce. Barbecue sauce.  Sweets/Desserts Chocolate and cocoa. Donuts. Peppermint and spearmint. Fats and Oils High-fat foods, including [place] fries and potato chips. Other Vinegar. Strong spices, such  as black pepper, white pepper, red pepper, cayenne, curry powder, cloves, ginger, and chili powder. The items listed above may not be a complete list of foods and beverages to avoid. Contact your dietitian for more information. Document Released: 09/27/2005 Document Revised: 10/02/2013 Document Reviewed: 08/01/2013 [facility] Patient Information 2015 [place], [place]. This information is not intended to replace advice given to you by your health care provider. Make sure you discuss any questions you have with your health care provider.

## 2014-07-24 NOTE — Progress Notes (Signed)
Pre visit review using our clinic review tool, if applicable. No additional management support is needed unless otherwise documented below in the visit note. 

## 2014-07-24 NOTE — Addendum Note (Signed)
Addended byRoxy Cedar B on: 07/24/2014 09:40 AM   Modules accepted: Level of Service

## 2014-07-25 ENCOUNTER — Ambulatory Visit: Payer: Medicare Other

## 2014-08-12 ENCOUNTER — Other Ambulatory Visit: Payer: Self-pay | Admitting: Internal Medicine

## 2014-09-03 ENCOUNTER — Ambulatory Visit (INDEPENDENT_AMBULATORY_CARE_PROVIDER_SITE_OTHER): Payer: Medicare Other | Admitting: Family

## 2014-09-03 DIAGNOSIS — Z5181 Encounter for therapeutic drug level monitoring: Secondary | ICD-10-CM

## 2014-09-03 DIAGNOSIS — I482 Chronic atrial fibrillation, unspecified: Secondary | ICD-10-CM

## 2014-09-03 LAB — POCT INR: INR: 3.3

## 2014-09-03 NOTE — Patient Instructions (Signed)
Hold Coumadin today only. Continue to take 1 tablet all days except 1/2 tablet on Monday/Thursday.  Re-check in 4 week.  Anticoagulation Dose Instructions as of 09/03/2014      Dorene Grebe Tue Wed Thu Fri Sat   New Dose 2 mg 1 mg 2 mg 2 mg 1 mg 2 mg 2 mg    Description        Hold Coumadin today only. Continue to take 1 tablet all days except 1/2 tablet on Monday/Thursday.  Re-check in 4 week.

## 2014-09-27 ENCOUNTER — Other Ambulatory Visit: Payer: Self-pay | Admitting: Internal Medicine

## 2014-10-01 ENCOUNTER — Ambulatory Visit (INDEPENDENT_AMBULATORY_CARE_PROVIDER_SITE_OTHER): Payer: Medicare Other | Admitting: Family

## 2014-10-01 DIAGNOSIS — I482 Chronic atrial fibrillation, unspecified: Secondary | ICD-10-CM

## 2014-10-01 DIAGNOSIS — Z5181 Encounter for therapeutic drug level monitoring: Secondary | ICD-10-CM

## 2014-10-01 LAB — POCT INR: INR: 2.5

## 2014-10-01 NOTE — Patient Instructions (Signed)
Continue to take 1 tablet all days except 1/2 tablet on Monday/Thursday.  Re-check in 4 week.  Anticoagulation Dose Instructions as of 10/01/2014      Bonnie Roberts Tue Wed Thu Fri Sat   New Dose 2 mg 1 mg 2 mg 2 mg 1 mg 2 mg 2 mg    Description        Continue to take 1 tablet all days except 1/2 tablet on Monday/Thursday.  Re-check in 4 week.

## 2014-10-06 ENCOUNTER — Other Ambulatory Visit: Payer: Self-pay | Admitting: Internal Medicine

## 2014-10-07 LAB — HM MAMMOGRAPHY

## 2014-10-15 ENCOUNTER — Encounter: Payer: Self-pay | Admitting: Cardiovascular Disease

## 2014-10-17 DIAGNOSIS — N3091 Cystitis, unspecified with hematuria: Secondary | ICD-10-CM | POA: Diagnosis not present

## 2014-10-19 ENCOUNTER — Encounter: Payer: Self-pay | Admitting: Internal Medicine

## 2014-10-29 ENCOUNTER — Ambulatory Visit (INDEPENDENT_AMBULATORY_CARE_PROVIDER_SITE_OTHER): Payer: Medicare Other | Admitting: Internal Medicine

## 2014-10-29 ENCOUNTER — Encounter: Payer: Self-pay | Admitting: Internal Medicine

## 2014-10-29 VITALS — Temp 98.2°F | Ht 61.0 in | Wt 150.0 lb

## 2014-10-29 DIAGNOSIS — I10 Essential (primary) hypertension: Secondary | ICD-10-CM | POA: Diagnosis not present

## 2014-10-29 DIAGNOSIS — N390 Urinary tract infection, site not specified: Secondary | ICD-10-CM

## 2014-10-29 DIAGNOSIS — R300 Dysuria: Secondary | ICD-10-CM | POA: Diagnosis not present

## 2014-10-29 LAB — POCT URINALYSIS DIPSTICK
Bilirubin, UA: NEGATIVE
Glucose, UA: NEGATIVE
KETONES UA: NEGATIVE
Nitrite, UA: NEGATIVE
PH UA: 7
Spec Grav, UA: 1.02
UROBILINOGEN UA: 1

## 2014-10-29 MED ORDER — CIPROFLOXACIN HCL 500 MG PO TABS: 500.0000 mg | ORAL_TABLET | Freq: Two times a day (BID) | ORAL | Status: DC

## 2014-10-29 NOTE — Patient Instructions (Addendum)
Take your antibiotic as prescribed until ALL of it is gone, but stop if you develop a rash, swelling, or any side effects of the medication.  Contact our office as soon as possible if  there are side effects of the medication.  Hold Coumadin Wednesday and Friday  Call or return to clinic prn if these symptoms worsen or fail to improve as anticipated.

## 2014-10-29 NOTE — Progress Notes (Signed)
Pre visit review using our clinic review tool, if applicable. No additional management support is needed unless otherwise documented below in the visit note. 

## 2014-10-29 NOTE — Progress Notes (Signed)
Subjective:    Patient ID: Bonnie Roberts, female    DOB: 17-Dec-1921, 81 y.o.   MRN: [id number]  HPI 68 -year-old patient who presents with a several day history of dysuria and general sense of unwellness.  She was seen at the urgent care on January 7 and treated with Macrodantin for a UTI.  She was treated for 5 days only.  She is on Coumadin anticoagulation.  No fever or chills.  Past Medical History  Diagnosis Date  . ANXIETY DEPRESSION 12/11/2009  . Atrial fibrillation 04/10/2007  . COLONIC POLYPS, HX OF 04/10/2007  . DISORDER, MENOPAUSAL NOS 05/15/2007  . DYSPNEA 02/11/2009  . Edema 03/13/2009  . HYPERTENSION 04/10/2007  . Osteoarth NOS-Unspec 04/10/2007  . VENOUS INSUFFICIENCY, CHRONIC 01/22/2010    History   Social History  . Marital Status: Widowed    Spouse Name: N/A    Number of Children: 4  . Years of Education: N/A   Occupational History  . RETIRED    Social History Main Topics  . Smoking status: Never Smoker   . Smokeless tobacco: Never Used  . Alcohol Use: No  . Drug Use: No  . Sexual Activity: Not on file   Other Topics Concern  . Not on file   Social History Narrative    Past Surgical History  Procedure Laterality Date  . Cholecystectomy  1965  . Carpal tunnel release    . Abdominal hysterectomy  1959  . Total knee arthroplasty  1993    right  . Rectocele repair    . Bladder surgery      tacking  . Hernia repair    . Cardiac catheterization      No family history on file.  Allergies  Allergen Reactions  . Codeine     REACTION: n/v  . Nsaids     REACTION: gib  . Penicillins     REACTION: rash  . Sulfonamide Derivatives     REACTION: rash    Current Outpatient Prescriptions on File Prior to Visit  Medication Sig Dispense Refill  . atenolol (TENORMIN) 25 MG tablet TAKE 1 TABLET (25 MG TOTAL) BY MOUTH DAILY. 90 tablet 1  . b complex vitamins capsule Take 1 capsule by mouth daily.      Marland Kitchen diltiazem (CARDIZEM CD) 120 MG 24 hr capsule TAKE 1  CAPSULE (120 MG TOTAL) BY MOUTH DAILY. 90 capsule 2  . Diphenhydramine-APAP, sleep, (TYLENOL PM EXTRA STRENGTH PO) Take 1 tablet by mouth as needed.    . docusate sodium (COLACE) 100 MG capsule Take 1 capsule (100 mg total) by mouth daily. 10 capsule   . Flaxseed, Linseed, (BL FLAX SEED OIL) 1000 MG CAPS Take by mouth daily.      . furosemide (LASIX) 40 MG tablet TAKE 1 TABLET (40 MG TOTAL) BY MOUTH 2 (TWO) TIMES DAILY. 180 tablet 3  . KLOR-CON M20 20 MEQ tablet TAKE 1 TABLET BY MOUTH TWICE A DAY 180 tablet 1  . Multiple Vitamins-Minerals (CENTRUM SILVER PO) Take by mouth daily.      . Omega-3 Fatty Acids (FISH OIL) 1000 MG CAPS 2 TABS DAILY    . omeprazole (PRILOSEC) 20 MG capsule Take 1 capsule (20 mg total) by mouth daily. 30 capsule 3  . warfarin (COUMADIN) 2 MG tablet TAKE AS DIRECTED BY COUMADIN CLINIC. 90 tablet 1   No current facility-administered medications on file prior to visit.    Temp(Src) 98.2 F (36.8 C) (Oral)  Ht 5\' 1"  (1.549  m)  Wt 150 lb (68.04 kg)  BMI 28.36 kg/m2      Review of Systems  Constitutional: Positive for fatigue.  HENT: Negative for congestion, dental problem, hearing loss, rhinorrhea, sinus pressure, sore throat and tinnitus.   Eyes: Negative for pain, discharge and visual disturbance.  Respiratory: Negative for cough and shortness of breath.   Cardiovascular: Negative for chest pain, palpitations and leg swelling.  Gastrointestinal: Negative for nausea, vomiting, abdominal pain, diarrhea, constipation, blood in stool and abdominal distention.  Genitourinary: Positive for dysuria. Negative for urgency, frequency, hematuria, flank pain, vaginal bleeding, vaginal discharge, difficulty urinating, vaginal pain and pelvic pain.  Musculoskeletal: Negative for joint swelling, arthralgias and gait problem.  Skin: Negative for rash.  Neurological: Positive for weakness. Negative for dizziness, syncope, speech difficulty, numbness and headaches.    Hematological: Negative for adenopathy.  Psychiatric/Behavioral: Negative for behavioral problems, dysphoric mood and agitation. The patient is not nervous/anxious.        Objective:   Physical Exam  Constitutional: She is oriented to person, place, and time. She appears well-developed and well-nourished.  HENT:  Head: Normocephalic.  Right Ear: External ear normal.  Left Ear: External ear normal.  Mouth/Throat: Oropharynx is clear and moist.  Eyes: Conjunctivae and EOM are normal. Pupils are equal, round, and reactive to light.  Neck: Normal range of motion. Neck supple. No thyromegaly present.  Cardiovascular: Normal rate and normal heart sounds.   Irregular rhythm with a controlled ventricular response  Pulmonary/Chest: Effort normal and breath sounds normal.  Abdominal: Soft. Bowel sounds are normal. She exhibits no mass. There is no tenderness.  Musculoskeletal: Normal range of motion.  Lymphadenopathy:    She has no cervical adenopathy.  Neurological: She is alert and oriented to person, place, and time.  Skin: Skin is warm and dry. No rash noted.  Psychiatric: She has a normal mood and affect. Her behavior is normal.          Assessment & Plan:  UTI.  Will treat with Cipro for 5 days.  We'll decrease Coumadin dosing short-term.  Check urine culture Chronic atrial fibrillation Chronic Coumadin anticoagulation

## 2014-10-30 ENCOUNTER — Telehealth: Payer: Self-pay | Admitting: Internal Medicine

## 2014-10-30 NOTE — Telephone Encounter (Signed)
emmi mailed  °

## 2014-10-31 ENCOUNTER — Ambulatory Visit: Payer: Medicare Other | Admitting: Family

## 2014-11-01 LAB — URINE CULTURE

## 2014-11-12 ENCOUNTER — Ambulatory Visit (INDEPENDENT_AMBULATORY_CARE_PROVIDER_SITE_OTHER): Payer: Medicare Other | Admitting: Family

## 2014-11-12 DIAGNOSIS — I4891 Unspecified atrial fibrillation: Secondary | ICD-10-CM | POA: Diagnosis not present

## 2014-11-12 DIAGNOSIS — Z5181 Encounter for therapeutic drug level monitoring: Secondary | ICD-10-CM | POA: Diagnosis not present

## 2014-11-12 LAB — POCT INR: INR: 1.7

## 2014-11-12 NOTE — Patient Instructions (Signed)
Take and extra 1/2 tablet today only. Then continue to take 1 tablet all days except 1/2 tablet on Monday/Thursday.  Re-check in 4 week.  Anticoagulation Dose Instructions as of 11/12/2014      Bonnie Roberts Tue Wed Thu Fri Sat   New Dose 2 mg 1 mg 2 mg 2 mg 1 mg 2 mg 2 mg    Description        Take and extra 1/2 tablet today only. Then continue to take 1 tablet all days except 1/2 tablet on Monday/Thursday.  Re-check in 4 week.

## 2014-11-13 ENCOUNTER — Other Ambulatory Visit: Payer: Self-pay | Admitting: Internal Medicine

## 2014-12-09 ENCOUNTER — Ambulatory Visit (INDEPENDENT_AMBULATORY_CARE_PROVIDER_SITE_OTHER): Payer: Medicare Other | Admitting: General Practice

## 2014-12-09 DIAGNOSIS — Z5181 Encounter for therapeutic drug level monitoring: Secondary | ICD-10-CM

## 2014-12-09 LAB — POCT INR: INR: 1.9

## 2014-12-09 NOTE — Progress Notes (Signed)
Pre visit review using our clinic review tool, if applicable. No additional management support is needed unless otherwise documented below in the visit note. 

## 2014-12-11 ENCOUNTER — Other Ambulatory Visit: Payer: Self-pay

## 2014-12-11 MED ORDER — OMEPRAZOLE 20 MG PO CPDR: DELAYED_RELEASE_CAPSULE | ORAL | Status: DC

## 2014-12-12 ENCOUNTER — Other Ambulatory Visit: Payer: Self-pay | Admitting: Internal Medicine

## 2015-01-06 ENCOUNTER — Ambulatory Visit: Payer: Medicare Other

## 2015-01-09 ENCOUNTER — Ambulatory Visit (INDEPENDENT_AMBULATORY_CARE_PROVIDER_SITE_OTHER): Payer: Medicare Other | Admitting: General Practice

## 2015-01-09 DIAGNOSIS — I4891 Unspecified atrial fibrillation: Secondary | ICD-10-CM

## 2015-01-09 DIAGNOSIS — Z5181 Encounter for therapeutic drug level monitoring: Secondary | ICD-10-CM | POA: Diagnosis not present

## 2015-01-09 LAB — POCT INR: INR: 1.8

## 2015-01-09 NOTE — Progress Notes (Signed)
Pre visit review using our clinic review tool, if applicable. No additional management support is needed unless otherwise documented below in the visit note. 

## 2015-01-12 ENCOUNTER — Other Ambulatory Visit: Payer: Self-pay | Admitting: Family

## 2015-01-13 ENCOUNTER — Other Ambulatory Visit: Payer: Self-pay | Admitting: General Practice

## 2015-01-13 MED ORDER — WARFARIN SODIUM 2 MG PO TABS: ORAL_TABLET | ORAL | Status: DC

## 2015-01-21 ENCOUNTER — Encounter: Payer: Medicare Other | Admitting: Internal Medicine

## 2015-02-06 ENCOUNTER — Ambulatory Visit (INDEPENDENT_AMBULATORY_CARE_PROVIDER_SITE_OTHER): Payer: Medicare Other | Admitting: General Practice

## 2015-02-06 DIAGNOSIS — Z5181 Encounter for therapeutic drug level monitoring: Secondary | ICD-10-CM | POA: Diagnosis not present

## 2015-02-06 LAB — POCT INR: INR: 2.2

## 2015-02-06 NOTE — Progress Notes (Signed)
Pre visit review using our clinic review tool, if applicable. No additional management support is needed unless otherwise documented below in the visit note. 

## 2015-02-12 DIAGNOSIS — H5213 Myopia, bilateral: Secondary | ICD-10-CM | POA: Diagnosis not present

## 2015-02-12 DIAGNOSIS — H04129 Dry eye syndrome of unspecified lacrimal gland: Secondary | ICD-10-CM | POA: Diagnosis not present

## 2015-02-20 ENCOUNTER — Ambulatory Visit (INDEPENDENT_AMBULATORY_CARE_PROVIDER_SITE_OTHER): Payer: Medicare Other | Admitting: Internal Medicine

## 2015-02-20 ENCOUNTER — Encounter: Payer: Self-pay | Admitting: Internal Medicine

## 2015-02-20 VITALS — BP 160/70 | HR 84 | Temp 98.1°F | Resp 20 | Ht 59.5 in | Wt 151.0 lb

## 2015-02-20 DIAGNOSIS — Z23 Encounter for immunization: Secondary | ICD-10-CM | POA: Diagnosis not present

## 2015-02-20 DIAGNOSIS — M159 Polyosteoarthritis, unspecified: Secondary | ICD-10-CM

## 2015-02-20 DIAGNOSIS — M150 Primary generalized (osteo)arthritis: Secondary | ICD-10-CM

## 2015-02-20 DIAGNOSIS — Z5181 Encounter for therapeutic drug level monitoring: Secondary | ICD-10-CM

## 2015-02-20 DIAGNOSIS — Z0000 Encounter for general adult medical examination without abnormal findings: Secondary | ICD-10-CM | POA: Diagnosis not present

## 2015-02-20 DIAGNOSIS — E785 Hyperlipidemia, unspecified: Secondary | ICD-10-CM

## 2015-02-20 DIAGNOSIS — R0602 Shortness of breath: Secondary | ICD-10-CM

## 2015-02-20 DIAGNOSIS — R609 Edema, unspecified: Secondary | ICD-10-CM | POA: Diagnosis not present

## 2015-02-20 DIAGNOSIS — I10 Essential (primary) hypertension: Secondary | ICD-10-CM | POA: Diagnosis not present

## 2015-02-20 DIAGNOSIS — I480 Paroxysmal atrial fibrillation: Secondary | ICD-10-CM

## 2015-02-20 LAB — COMPREHENSIVE METABOLIC PANEL
ALT: 16 U/L (ref 0–35)
AST: 23 U/L (ref 0–37)
Albumin: 4 g/dL (ref 3.5–5.2)
Alkaline Phosphatase: 90 U/L (ref 39–117)
BUN: 23 mg/dL (ref 6–23)
CO2: 32 meq/L (ref 19–32)
Calcium: 9.6 mg/dL (ref 8.4–10.5)
Chloride: 102 mEq/L (ref 96–112)
Creatinine, Ser: 0.98 mg/dL (ref 0.40–1.20)
GFR: 56.62 mL/min — ABNORMAL LOW (ref >60.00)
GLUCOSE: 88 mg/dL (ref 70–99)
Potassium: 3.9 mEq/L (ref 3.5–5.1)
SODIUM: 143 meq/L (ref 135–145)
Total Bilirubin: 1.1 mg/dL (ref 0.2–1.2)
Total Protein: 7.3 g/dL (ref 6.0–8.3)

## 2015-02-20 LAB — CBC WITH DIFFERENTIAL/PLATELET
BASOS PCT: 0.9 % (ref 0.0–3.0)
Basophils Absolute: 0.1 10*3/uL (ref 0.0–0.1)
EOS PCT: 2.6 % (ref 0.0–5.0)
Eosinophils Absolute: 0.2 10*3/uL (ref 0.0–0.7)
HCT: 42.8 % (ref 36.0–46.0)
HEMOGLOBIN: 14.4 g/dL (ref 12.0–15.0)
LYMPHS PCT: 33 % (ref 12.0–46.0)
Lymphs Abs: 2.2 10*3/uL (ref 0.7–4.0)
MCHC: 33.7 g/dL (ref 30.0–36.0)
MCV: 93.4 fl (ref 78.0–100.0)
Monocytes Absolute: 0.5 10*3/uL (ref 0.1–1.0)
Monocytes Relative: 8.2 % (ref 3.0–12.0)
NEUTROS ABS: 3.7 10*3/uL (ref 1.4–7.7)
Neutrophils Relative %: 55.3 % (ref 43.0–77.0)
Platelets: 198 10*3/uL (ref 150.0–400.0)
RBC: 4.59 Mil/uL (ref 3.87–5.11)
RDW: 15 % (ref 11.5–15.5)
WBC: 6.7 10*3/uL (ref 4.0–10.5)

## 2015-02-20 LAB — LIPID PANEL
Cholesterol: 171 mg/dL (ref 0–200)
HDL: 52.9 mg/dL (ref >39.00)
LDL CALC: 107 mg/dL — AB (ref 0–99)
NonHDL: 118.1
Total CHOL/HDL Ratio: 3
Triglycerides: 55 mg/dL (ref 0.0–149.0)
VLDL: 11 mg/dL (ref 0.0–40.0)

## 2015-02-20 LAB — TSH: TSH: 1.69 u[IU]/mL (ref 0.35–4.50)

## 2015-02-20 NOTE — Patient Instructions (Addendum)
Limit your sodium (Salt) intake  Return in 6 months for follow-up  Health Maintenance Adopting a healthy lifestyle and getting preventive care can go a long way to promote health and wellness. Talk with your health care provider about what schedule of regular examinations is right for you. This is a good chance for you to check in with your provider about disease prevention and staying healthy. In between checkups, there are plenty of things you can do on your own. Experts have done a lot of research about which lifestyle changes and preventive measures are most likely to keep you healthy. Ask your health care provider for more information. WEIGHT AND DIET  Eat a healthy diet  Be sure to include plenty of vegetables, fruits, low-fat dairy products, and lean protein.  Do not eat a lot of foods high in solid fats, added sugars, or salt.  Get regular exercise. This is one of the most important things you can do for your health.  Most adults should exercise for at least 150 minutes each week. The exercise should increase your heart rate and make you sweat (moderate-intensity exercise).  Most adults should also do strengthening exercises at least twice a week. This is in addition to the moderate-intensity exercise.  Maintain a healthy weight  Body mass index (BMI) is a measurement that can be used to identify possible weight problems. It estimates body fat based on height and weight. Your health care provider can help determine your BMI and help you achieve or maintain a healthy weight.  For females 22 years of age and older:   A BMI below 18.5 is considered underweight.  A BMI of 18.5 to 24.9 is normal.  A BMI of 25 to 29.9 is considered overweight.  A BMI of 30 and above is considered obese.  Watch levels of cholesterol and blood lipids  You should start having your blood tested for lipids and cholesterol at 79 years of age, then have this test every 5 years.  You may need to have  your cholesterol levels checked more often if:  Your lipid or cholesterol levels are high.  You are older than 79 years of age.  You are at high risk for heart disease.  CANCER SCREENING   Lung Cancer  Lung cancer screening is recommended for adults 29-58 years old who are at high risk for lung cancer because of a history of smoking.  A yearly low-dose CT scan of the lungs is recommended for people who:  Currently smoke.  Have quit within the past 15 years.  Have at least a 30-pack-year history of smoking. A pack year is smoking an average of one pack of cigarettes a day for 1 year.  Yearly screening should continue until it has been 15 years since you quit.  Yearly screening should stop if you develop a health problem that would prevent you from having lung cancer treatment.  Breast Cancer  Practice breast self-awareness. This means understanding how your breasts normally appear and feel.  It also means doing regular breast self-exams. Let your health care provider know about any changes, no matter how small.  If you are in your 20s or 30s, you should have a clinical breast exam (CBE) by a health care provider every 1-3 years as part of a regular health exam.  If you are 78 or older, have a CBE every year. Also consider having a breast X-ray (mammogram) every year.  If you have a family history of breast cancer,  talk to your health care provider about genetic screening.  If you are at high risk for breast cancer, talk to your health care provider about having an MRI and a mammogram every year.  Breast cancer gene (BRCA) assessment is recommended for women who have family members with BRCA-related cancers. BRCA-related cancers include:  Breast.  Ovarian.  Tubal.  Peritoneal cancers.  Results of the assessment will determine the need for genetic counseling and BRCA1 and BRCA2 testing. Cervical Cancer Routine pelvic examinations to screen for cervical cancer are no  longer recommended for nonpregnant women who are considered low risk for cancer of the pelvic organs (ovaries, uterus, and vagina) and who do not have symptoms. A pelvic examination may be necessary if you have symptoms including those associated with pelvic infections. Ask your health care provider if a screening pelvic exam is right for you.   The Pap test is the screening test for cervical cancer for women who are considered at risk.  If you had a hysterectomy for a problem that was not cancer or a condition that could lead to cancer, then you no longer need Pap tests.  If you are older than 65 years, and you have had normal Pap tests for the past 10 years, you no longer need to have Pap tests.  If you have had past treatment for cervical cancer or a condition that could lead to cancer, you need Pap tests and screening for cancer for at least 20 years after your treatment.  If you no longer get a Pap test, assess your risk factors if they change (such as having a new sexual partner). This can affect whether you should start being screened again.  Some women have medical problems that increase their chance of getting cervical cancer. If this is the case for you, your health care provider may recommend more frequent screening and Pap tests.  The human papillomavirus (HPV) test is another test that may be used for cervical cancer screening. The HPV test looks for the virus that can cause cell changes in the cervix. The cells collected during the Pap test can be tested for HPV.  The HPV test can be used to screen women 12 years of age and older. Getting tested for HPV can extend the interval between normal Pap tests from three to five years.  An HPV test also should be used to screen women of any age who have unclear Pap test results.  After 79 years of age, women should have HPV testing as often as Pap tests.  Colorectal Cancer  This type of cancer can be detected and often  prevented.  Routine colorectal cancer screening usually begins at 79 years of age and continues through 79 years of age.  Your health care provider may recommend screening at an earlier age if you have risk factors for colon cancer.  Your health care provider may also recommend using home test kits to check for hidden blood in the stool.  A small camera at the end of a tube can be used to examine your colon directly (sigmoidoscopy or colonoscopy). This is done to check for the earliest forms of colorectal cancer.  Routine screening usually begins at age 35.  Direct examination of the colon should be repeated every 5-10 years through 79 years of age. However, you may need to be screened more often if early forms of precancerous polyps or small growths are found. Skin Cancer  Check your skin from head to toe regularly.  Tell your health care provider about any new moles or changes in moles, especially if there is a change in a mole's shape or color.  Also tell your health care provider if you have a mole that is larger than the size of a pencil eraser.  Always use sunscreen. Apply sunscreen liberally and repeatedly throughout the day.  Protect yourself by wearing long sleeves, pants, a wide-brimmed hat, and sunglasses whenever you are outside. HEART DISEASE, DIABETES, AND HIGH BLOOD PRESSURE   Have your blood pressure checked at least every 1-2 years. High blood pressure causes heart disease and increases the risk of stroke.  If you are between 72 years and 44 years old, ask your health care provider if you should take aspirin to prevent strokes.  Have regular diabetes screenings. This involves taking a blood sample to check your fasting blood sugar level.  If you are at a normal weight and have a low risk for diabetes, have this test once every three years after 79 years of age.  If you are overweight and have a high risk for diabetes, consider being tested at a younger age or more  often. PREVENTING INFECTION  Hepatitis B  If you have a higher risk for hepatitis B, you should be screened for this virus. You are considered at high risk for hepatitis B if:  You were born in a country where hepatitis B is common. Ask your health care provider which countries are considered high risk.  Your parents were born in a high-risk country, and you have not been immunized against hepatitis B (hepatitis B vaccine).  You have HIV or AIDS.  You use needles to inject street drugs.  You live with someone who has hepatitis B.  You have had sex with someone who has hepatitis B.  You get hemodialysis treatment.  You take certain medicines for conditions, including cancer, organ transplantation, and autoimmune conditions. Hepatitis C  Blood testing is recommended for:  Everyone born from [birth year] through 1963.  Anyone with known risk factors for hepatitis C. Sexually transmitted infections (STIs)  You should be screened for sexually transmitted infections (STIs) including gonorrhea and chlamydia if:  You are sexually active and are younger than 79 years of age.  You are older than 79 years of age and your health care provider tells you that you are at risk for this type of infection.  Your sexual activity has changed since you were last screened and you are at an increased risk for chlamydia or gonorrhea. Ask your health care provider if you are at risk.  If you do not have HIV, but are at risk, it may be recommended that you take a prescription medicine daily to prevent HIV infection. This is called pre-exposure prophylaxis (PrEP). You are considered at risk if:  You are sexually active and do not regularly use condoms or know the HIV status of your partner(s).  You take drugs by injection.  You are sexually active with a partner who has HIV. Talk with your health care provider about whether you are at high risk of being infected with HIV. If you choose to begin PrEP, you  should first be tested for HIV. You should then be tested every 3 months for as long as you are taking PrEP.  PREGNANCY   If you are premenopausal and you may become pregnant, ask your health care provider about preconception counseling.  If you may become pregnant, take 400 to 800 micrograms (mcg) of folic acid  every day.  If you want to prevent pregnancy, talk to your health care provider about birth control (contraception). OSTEOPOROSIS AND MENOPAUSE   Osteoporosis is a disease in which the bones lose minerals and strength with aging. This can result in serious bone fractures. Your risk for osteoporosis can be identified using a bone density scan.  If you are 63 years of age or older, or if you are at risk for osteoporosis and fractures, ask your health care provider if you should be screened.  Ask your health care provider whether you should take a calcium or vitamin D supplement to lower your risk for osteoporosis.  Menopause may have certain physical symptoms and risks.  Hormone replacement therapy may reduce some of these symptoms and risks. Talk to your health care provider about whether hormone replacement therapy is right for you.  HOME CARE INSTRUCTIONS   Schedule regular health, dental, and eye exams.  Stay current with your immunizations.   Do not use any tobacco products including cigarettes, chewing tobacco, or electronic cigarettes.  If you are pregnant, do not drink alcohol.  If you are breastfeeding, limit how much and how often you drink alcohol.  Limit alcohol intake to no more than 1 drink per day for nonpregnant women. One drink equals 12 ounces of beer, 5 ounces of wine, or 1 ounces of hard liquor.  Do not use street drugs.  Do not share needles.  Ask your health care provider for help if you need support or information about quitting drugs.  Tell your health care provider if you often feel depressed.  Tell your health care provider if you have ever  been abused or do not feel safe at home. Document Released: 04/12/2011 Document Revised: 02/11/2014 Document Reviewed: 08/29/2013 [facility] Patient Information 2015 [place], [place]. This information is not intended to replace advice given to you by your health care provider. Make sure you discuss any questions you have with your health care provider.

## 2015-02-20 NOTE — Progress Notes (Signed)
Pre visit review using our clinic review tool, if applicable. No additional management support is needed unless otherwise documented below in the visit note. 

## 2015-02-20 NOTE — Progress Notes (Signed)
Subjective:    Patient ID: Bonnie Roberts, female    DOB: [birth year], 81 y.o.   MRN: [id number]  HPI   Subjective:    Patient ID: Bonnie Roberts, female    DOB: 1921/10/15, 81 y.o.   MRN: [id number]  HPI  Subjective:    Patient ID: Bonnie Roberts, female    DOB: [birth year], 81 y.o.   MRN: [id number]  HPI  History of Present Illness:   81 year-old patient who is seen today for follow-up and annual health assessment.   She has a history of chronic atrial fibrillation on rate control and anticoagulation. Coumadin therapy. She has DJD and a history of well-controlled hypertension. She does remarkably well. She denies any cardiopulmonary complaints. She has mild chronic venous insufficiency and edema. She continues to do remarkably well.  Is the primary caregiver of a disabled daughter  Allergies:   1) ! Pcn  2) ! Sulfa  3) ! Nsaids  4) ! Codeine   Past History:  Past Medical History:     ATRIAL FIBRILLATION (ICD-427.31)  HYPERTENSION (ICD-401.9)  EDEMA (ICD-782.3)  DYSPNEA (ICD-786.05)  HEMORRHOID, EXTERNAL, THROMBOSED (ICD-455.4)  DISORDER, MENOPAUSAL NOS (ICD-627.9)  OSTEOARTHRITIS (ICD-715.90)  DEGENERATIVE JOINT DISEASE (ICD-715.90)  COLONIC POLYPS, HX OF (ICD-V12.72)   Past Surgical History:    Carpal tunnel release-right  Cholecystectomy-1967  Hysterectomy-1959  Total knee replacement-right-2003  Rectocele/bladder tacking-1996  hernia repair-left  G 4 P 4 AB 0  Heart cath-2006  Colonoscopy-05/2004  Past Medical History  Diagnosis Date  . ANXIETY DEPRESSION 12/11/2009  . Atrial fibrillation 04/10/2007  . COLONIC POLYPS, HX OF 04/10/2007  . DISORDER, MENOPAUSAL NOS 05/15/2007  . DYSPNEA 02/11/2009  . Edema 03/13/2009  . HYPERTENSION 04/10/2007  . Osteoarth NOS-Unspec 04/10/2007  . VENOUS INSUFFICIENCY, CHRONIC 01/22/2010    History   Social History  . Marital Status: Widowed    Spouse Name: N/A  . Number of Children: 4  . Years of Education: N/A    Occupational History  . RETIRED    Social History Main Topics  . Smoking status: Never Smoker   . Smokeless tobacco: Never Used  . Alcohol Use: No  . Drug Use: No  . Sexual Activity: Not on file   Other Topics Concern  . Not on file   Social History Narrative    Past Surgical History  Procedure Laterality Date  . Cholecystectomy  1965  . Carpal tunnel release    . Abdominal hysterectomy  1959  . Total knee arthroplasty  1993    right  . Rectocele repair    . Bladder surgery      tacking  . Hernia repair    . Cardiac catheterization      No family history on file.  Allergies  Allergen Reactions  . Codeine     REACTION: n/v  . Nsaids     REACTION: gib  . Penicillins     REACTION: rash  . Sulfonamide Derivatives     REACTION: rash    Current Outpatient Prescriptions on File Prior to Visit  Medication Sig Dispense Refill  . atenolol (TENORMIN) 25 MG tablet TAKE 1 TABLET (25 MG TOTAL) BY MOUTH DAILY. 90 tablet 1  . b complex vitamins capsule Take 1 capsule by mouth daily.      Marland Kitchen diltiazem (CARDIZEM CD) 120 MG 24 hr capsule TAKE 1 CAPSULE (120 MG TOTAL) BY MOUTH DAILY. 90 capsule 2  . docusate sodium (COLACE) 100 MG capsule Take 1 capsule (  100 mg total) by mouth daily. 10 capsule   . Flaxseed, Linseed, (BL FLAX SEED OIL) 1000 MG CAPS Take by mouth daily.      . furosemide (LASIX) 40 MG tablet TAKE 1 TABLET (40 MG TOTAL) BY MOUTH 2 (TWO) TIMES DAILY. 180 tablet 3  . KLOR-CON M20 20 MEQ tablet TAKE 1 TABLET BY MOUTH TWICE A DAY 180 tablet 1  . Multiple Vitamins-Minerals (CENTRUM SILVER PO) Take by mouth daily.      . Omega-3 Fatty Acids (FISH OIL) 1000 MG CAPS 2 TABS DAILY    . omeprazole (PRILOSEC) 20 MG capsule TAKE 1 CAPSULE (20 MG TOTAL) BY MOUTH DAILY. 90 capsule 1  . warfarin (COUMADIN) 2 MG tablet Take as directed by anticoagulation clinic 90 tablet 1   No current facility-administered medications on file prior to visit.    BP 160/70 mmHg  Pulse 84   Temp(Src) 98.1 F (36.7 C) (Oral)  Resp 20  Ht 4' 11.5" (1.511 m)  Wt 151 lb (68.493 kg)  BMI 30.00 kg/m2  SpO2 98%     1. Risk factors, based on past  M,S,F history- cardiovascular risk factors include hypertension and age. She has chronic atrial fibrillation.  2.  Physical activities: Fairly active for 81  years of age does have some arthritis limiting activity  3.  Depression/mood: No history depression or mood disorder  4.  Hearing: Moderately severe deficits. Uses hearing aids  5.  ADL's: Independent in all aspects of daily living. Cares for a disabled daughter  68.  Fall risk: Moderate due to age and unsteadiness.  Has had a follow-up within the past year 7.  Home safety: No problems identified  8.  Height weight, and visual acuity; height and weight stable no change in visual acuity  9.  Counseling: Modest weight loss encouraged calcium and vitamin D supplements encouraged  10. Lab orders based on risk factors: Laboratory update will be reviewed  11. Referral : Followup cardiology  12. Care plan: Follow Coumadin clinic  13. Cognitive assessment: Alert and oriented with normal affect. No cognitive dysfunction  14.  Preventive services will include annual health assessments with screening lab.  Annual eye examinations recommended  15.  Provider list update includes cardiology, primary care as well as ophthalmology.     Review of Systems  Constitutional: Negative for fever, appetite change, fatigue and unexpected weight change.  HENT: Negative for hearing loss, ear pain, nosebleeds, congestion, sore throat, mouth sores, trouble swallowing, neck stiffness, dental problem, voice change, sinus pressure and tinnitus.   Eyes: Negative for photophobia, pain, redness and visual disturbance.  Respiratory: Negative for cough, chest tightness and shortness of breath.   Cardiovascular: Positive for leg swelling. Negative for chest pain and palpitations.  Gastrointestinal:  Negative for nausea, vomiting, abdominal pain, diarrhea, constipation, blood in stool, abdominal distention and rectal pain.  Genitourinary: Negative for dysuria, urgency, frequency, hematuria, flank pain, vaginal bleeding, vaginal discharge, difficulty urinating, genital sores, vaginal pain, menstrual problem and pelvic pain.  Musculoskeletal: Positive for back pain and arthralgias.  Skin: Negative for rash.  Neurological: Negative for dizziness, syncope, speech difficulty, weakness, light-headedness, numbness and headaches.  Hematological: Negative for adenopathy. Does not bruise/bleed easily.  Psychiatric/Behavioral: Negative for suicidal ideas, behavioral problems, self-injury, dysphoric mood and agitation. The patient is not nervous/anxious.        Objective:   Physical Exam  Constitutional: She is oriented to person, place, and time. She appears well-developed and well-nourished.  HENT:  Head:  Normocephalic and atraumatic.  Right Ear: External ear normal.  Left Ear: External ear normal.  Mouth/Throat: Oropharynx is clear and moist.  Eyes: Conjunctivae normal and EOM are normal.  Neck: Normal range of motion. Neck supple. No JVD present. No thyromegaly present.  Cardiovascular: Normal rate, regular rhythm, normal heart sounds and intact distal pulses.   No murmur heard.      Dorsalis pedis pulses full. Posterior tibial pulses not easily palpable  Pulmonary/Chest: Effort normal and breath sounds normal. She has no wheezes. She has no rales.  Abdominal: Soft. Bowel sounds are normal. She exhibits no distension and no mass. There is no tenderness. There is no rebound and no guarding.  Musculoskeletal: Normal range of motion. She exhibits no edema and no tenderness.       Left ankle edema  Chronic varicosities  Neurological: She is alert and oriented to person, place, and time. She has normal reflexes. No cranial nerve deficit. She exhibits normal muscle tone. Coordination normal.    Skin: Skin is warm and dry. No rash noted.  Psychiatric: She has a normal mood and affect. Her behavior is normal.          Assessment & Plan:   Preventive health examination Chronic atrial fibrillation. We'll continue rate control and chronic Coumadin anticoagulation Hypertension stable Chronic venous insufficiency   Review of Systems As above    Objective:   Physical Exam  Musculoskeletal:  Status post right total replacement surgery  Marked osteoarthritic joint changes of the hands    As above      Assessment & Plan:   As above Will check updated labs Medications updated Low-salt diet regular exercise modest weight loss all recommended    Review of Systems     Objective:   Physical Exam  Cardiovascular:  Dorsalis pedis pulses full.  Posterior tibial pulses faint  Musculoskeletal: She exhibits edema.  Skin:  Right upper quadrant scar.  Right knee surgical scar.          Assessment & Plan:   Preventive health examination Chronic atrial fibrillation. We'll continue rate control and chronic Coumadin anticoagulation Hypertension stable Chronic venous insufficiency.  Recheck 6 months Low-salt diet recommended No change in therapy Continue monthly INRs

## 2015-03-06 ENCOUNTER — Ambulatory Visit (INDEPENDENT_AMBULATORY_CARE_PROVIDER_SITE_OTHER): Payer: Medicare Other | Admitting: General Practice

## 2015-03-06 DIAGNOSIS — Z5181 Encounter for therapeutic drug level monitoring: Secondary | ICD-10-CM

## 2015-03-06 DIAGNOSIS — I4891 Unspecified atrial fibrillation: Secondary | ICD-10-CM

## 2015-03-06 LAB — POCT INR: INR: 2

## 2015-03-06 NOTE — Progress Notes (Signed)
Pre visit review using our clinic review tool, if applicable. No additional management support is needed unless otherwise documented below in the visit note. 

## 2015-04-03 ENCOUNTER — Ambulatory Visit (INDEPENDENT_AMBULATORY_CARE_PROVIDER_SITE_OTHER): Payer: Medicare Other | Admitting: General Practice

## 2015-04-03 DIAGNOSIS — Z5181 Encounter for therapeutic drug level monitoring: Secondary | ICD-10-CM

## 2015-04-03 DIAGNOSIS — I4891 Unspecified atrial fibrillation: Secondary | ICD-10-CM

## 2015-04-03 LAB — POCT INR: INR: 2.1

## 2015-04-03 NOTE — Progress Notes (Signed)
Pre visit review using our clinic review tool, if applicable. No additional management support is needed unless otherwise documented below in the visit note. 

## 2015-04-04 ENCOUNTER — Other Ambulatory Visit: Payer: Self-pay | Admitting: Internal Medicine

## 2015-04-15 ENCOUNTER — Encounter: Payer: Self-pay | Admitting: *Deleted

## 2015-04-18 ENCOUNTER — Encounter: Payer: Self-pay | Admitting: Cardiovascular Disease

## 2015-04-18 ENCOUNTER — Ambulatory Visit (INDEPENDENT_AMBULATORY_CARE_PROVIDER_SITE_OTHER): Payer: Medicare Other | Admitting: Cardiovascular Disease

## 2015-04-18 VITALS — BP 140/68 | HR 65 | Ht 59.5 in | Wt 152.4 lb

## 2015-04-18 DIAGNOSIS — I482 Chronic atrial fibrillation, unspecified: Secondary | ICD-10-CM

## 2015-04-18 NOTE — Patient Instructions (Signed)
Your physician wants you to follow-up in: YEAR WITH DR NISHAN  You will receive a reminder letter in the mail two months in advance. If you don't receive a letter, please call our office to schedule the follow-up appointment.  Your physician recommends that you continue on your current medications as directed. Please refer to the Current Medication list given to you today. 

## 2015-04-18 NOTE — Progress Notes (Signed)
Patient ID: Bonnie Roberts, female   DOB: June 04, 1922, 81 y.o.   MRN: [id number] 81 y.o. patient who is seen today for follow-up. She has a history of chronic atrial fibrillation on rate control and anticoagulation. Coumadin therapy. She has DJD and a history of well-controlled hypertension. She does remarkably well. She denies any cardiopulmonary complaints. She has had a recent cardiology visit. Her most recent INR was 2.2. She has mild chronic venous insufficiency and edema Edema is worse despite low sodium and support hose. Her arthritis prevents her from putting compression stockings on. Still driving has a 61 some odd yo daughter that she lives with and helps since she is bipolar.  Myovue: 08/21/12 Overall Impression: Low risk stress nuclear study. There is a very small area of reversible ischemia at the apex ( SDS = 4). There perfusion in the other areas is normal.  LV Ejection Fraction: Study not gated.  Doing well saw Dr Debara Pickett for veins in legs but decided to use support hose instead  Large hiatal hernia with some reflux   ROS: Denies fever, malais, weight loss, blurry vision, decreased visual acuity, cough, sputum, SOB, hemoptysis, pleuritic pain, palpitaitons, heartburn, abdominal pain, melena, lower extremity edema, claudication, or rash.  All other systems reviewed and negative  General: Affect appropriate Healthy:  appears stated age 44: normal Neck supple with no adenopathy JVP normal no bruits no thyromegaly Lungs clear with no wheezing and good diaphragmatic motion Heart:  S1/S2 no murmur, no rub, gallop or click PMI normal Abdomen: benighn, BS positve, no tenderness, no AAA no bruit.  No HSM or HJR Distal pulses intact with no bruits Plus 2 bilateral edema Neuro non-focal Skin warm and dry No muscular weakness   Current Outpatient Prescriptions  Medication Sig Dispense Refill  . acetaminophen (TYLENOL) 325 MG tablet Take 650 mg by mouth as needed.    Marland Kitchen atenolol  (TENORMIN) 25 MG tablet TAKE 1 TABLET (25 MG TOTAL) BY MOUTH DAILY. 90 tablet 1  . b complex vitamins capsule Take 1 capsule by mouth daily.      Marland Kitchen diltiazem (CARDIZEM CD) 120 MG 24 hr capsule TAKE 1 CAPSULE (120 MG TOTAL) BY MOUTH DAILY. 90 capsule 2  . docusate sodium (COLACE) 100 MG capsule Take 1 capsule (100 mg total) by mouth daily. 10 capsule   . Flaxseed, Linseed, (BL FLAX SEED OIL) 1000 MG CAPS Take by mouth daily.      . furosemide (LASIX) 40 MG tablet TAKE 1 TABLET (40 MG TOTAL) BY MOUTH 2 (TWO) TIMES DAILY. 180 tablet 3  . KLOR-CON M20 20 MEQ tablet TAKE 1 TABLET BY MOUTH TWICE A DAY 180 tablet 1  . Multiple Vitamins-Minerals (CENTRUM SILVER PO) Take by mouth daily.      . Omega-3 Fatty Acids (FISH OIL) 1000 MG CAPS 2 TABS DAILY    . omeprazole (PRILOSEC) 20 MG capsule TAKE 1 CAPSULE (20 MG TOTAL) BY MOUTH DAILY. 90 capsule 1  . warfarin (COUMADIN) 2 MG tablet Take as directed by anticoagulation clinic 90 tablet 1   No current facility-administered medications for this visit.    Allergies  Codeine; Nsaids; Penicillins; and Sulfonamide derivatives  Electrocardiogram:  2015  afib right axis  Arte 81  04/18/15  afib rate 65 otherwise normal no change from previous   Assessment and Plan Venous Insuf:  Stable continue support hose and diuretics Afib:  Good rate control Coumadin followed by Dr Fransisca Connors in low Rx range which is perfect HTN:  Well controlled.  Continue current medications and low sodium Dash type diet.    

## 2015-04-25 ENCOUNTER — Other Ambulatory Visit: Payer: Self-pay | Admitting: Internal Medicine

## 2015-04-29 DIAGNOSIS — H16102 Unspecified superficial keratitis, left eye: Secondary | ICD-10-CM | POA: Diagnosis not present

## 2015-05-08 ENCOUNTER — Other Ambulatory Visit: Payer: Self-pay | Admitting: Internal Medicine

## 2015-05-15 ENCOUNTER — Ambulatory Visit (INDEPENDENT_AMBULATORY_CARE_PROVIDER_SITE_OTHER): Payer: Medicare Other | Admitting: General Practice

## 2015-05-15 DIAGNOSIS — I4891 Unspecified atrial fibrillation: Secondary | ICD-10-CM

## 2015-05-15 DIAGNOSIS — Z5181 Encounter for therapeutic drug level monitoring: Secondary | ICD-10-CM

## 2015-05-15 LAB — POCT INR: INR: 2.3

## 2015-05-15 NOTE — Progress Notes (Signed)
Pre visit review using our clinic review tool, if applicable. No additional management support is needed unless otherwise documented below in the visit note. 

## 2015-06-03 ENCOUNTER — Encounter: Payer: Self-pay | Admitting: Internal Medicine

## 2015-06-03 ENCOUNTER — Ambulatory Visit (INDEPENDENT_AMBULATORY_CARE_PROVIDER_SITE_OTHER): Payer: Medicare Other | Admitting: Internal Medicine

## 2015-06-03 VITALS — BP 140/80 | HR 77 | Temp 99.1°F | Resp 20 | Ht 59.5 in | Wt 141.0 lb

## 2015-06-03 DIAGNOSIS — Z7901 Long term (current) use of anticoagulants: Secondary | ICD-10-CM

## 2015-06-03 DIAGNOSIS — I482 Chronic atrial fibrillation, unspecified: Secondary | ICD-10-CM

## 2015-06-03 DIAGNOSIS — I10 Essential (primary) hypertension: Secondary | ICD-10-CM

## 2015-06-03 NOTE — Progress Notes (Signed)
Pre visit review using our clinic review tool, if applicable. No additional management support is needed unless otherwise documented below in the visit note. 

## 2015-06-03 NOTE — Progress Notes (Signed)
Subjective:    Patient ID: Bonnie Roberts, female    DOB: [birth year], 81 y.o.   MRN: [id number]  HPI 81 year old patient who has a history of chronic atrial fibrillation and remains on chronic Coumadin anticoagulation.  She has a history of essential hypertension as well as osteoarthritis. Earlier today she noted a lesion involving the right vulvar area that has been painless.  She is seen today as a work in to evaluate this lesion. She has been seen by cardiology recently and has done quite well.  She does have INRs checked every 4-6 weeks  Past Medical History  Diagnosis Date  . ANXIETY DEPRESSION 12/11/2009  . Atrial fibrillation 04/10/2007  . COLONIC POLYPS, HX OF 04/10/2007  . DISORDER, MENOPAUSAL NOS 05/15/2007  . DYSPNEA 02/11/2009  . Edema 03/13/2009  . HYPERTENSION 04/10/2007  . Osteoarth NOS-Unspec 04/10/2007  . VENOUS INSUFFICIENCY, CHRONIC 01/22/2010    Social History   Social History  . Marital Status: Widowed    Spouse Name: N/A  . Number of Children: 4  . Years of Education: N/A   Occupational History  . RETIRED    Social History Main Topics  . Smoking status: Never Smoker   . Smokeless tobacco: Never Used  . Alcohol Use: No  . Drug Use: No  . Sexual Activity: Not on file   Other Topics Concern  . Not on file   Social History Narrative    Past Surgical History  Procedure Laterality Date  . Cholecystectomy  1965  . Carpal tunnel release    . Abdominal hysterectomy  1959  . Total knee arthroplasty  1993    right  . Rectocele repair    . Bladder surgery      tacking  . Hernia repair    . Cardiac catheterization      Family History  Problem Relation Age of Onset  . CVA Father     Allergies  Allergen Reactions  . Codeine     REACTION: n/v  . Nsaids     REACTION: gib  . Penicillins     REACTION: rash  . Sulfonamide Derivatives     REACTION: rash    Current Outpatient Prescriptions on File Prior to Visit  Medication Sig Dispense Refill  .  acetaminophen (TYLENOL) 325 MG tablet Take 650 mg by mouth as needed.    Marland Kitchen atenolol (TENORMIN) 25 MG tablet TAKE 1 TABLET (25 MG TOTAL) BY MOUTH DAILY. 90 tablet 1  . b complex vitamins capsule Take 1 capsule by mouth daily.      Marland Kitchen diltiazem (CARDIZEM CD) 120 MG 24 hr capsule TAKE 1 CAPSULE (120 MG TOTAL) BY MOUTH DAILY. 90 capsule 2  . docusate sodium (COLACE) 100 MG capsule Take 1 capsule (100 mg total) by mouth daily. 10 capsule   . Flaxseed, Linseed, (BL FLAX SEED OIL) 1000 MG CAPS Take by mouth daily.      . furosemide (LASIX) 40 MG tablet TAKE 1 TABLET (40 MG TOTAL) BY MOUTH 2 (TWO) TIMES DAILY. 180 tablet 3  . KLOR-CON M20 20 MEQ tablet TAKE 1 TABLET BY MOUTH TWICE A DAY 180 tablet 1  . Multiple Vitamins-Minerals (CENTRUM SILVER PO) Take by mouth daily.      . Omega-3 Fatty Acids (FISH OIL) 1000 MG CAPS 2 TABS DAILY    . omeprazole (PRILOSEC) 20 MG capsule TAKE 1 CAPSULE (20 MG TOTAL) BY MOUTH DAILY. 90 capsule 1  . warfarin (COUMADIN) 2 MG tablet Take as directed  by anticoagulation clinic 90 tablet 1   No current facility-administered medications on file prior to visit.    BP 140/80 mmHg  Pulse 77  Temp(Src) 99.1 F (37.3 C) (Oral)  Resp 20  Ht 4' 11.5" (1.511 m)  Wt 141 lb (63.957 kg)  BMI 28.01 kg/m2  SpO2 98%     Review of Systems  Constitutional: Negative.   HENT: Negative for congestion, dental problem, hearing loss, rhinorrhea, sinus pressure, sore throat and tinnitus.   Eyes: Negative for pain, discharge and visual disturbance.  Respiratory: Negative for cough and shortness of breath.   Cardiovascular: Positive for leg swelling. Negative for chest pain and palpitations.  Gastrointestinal: Negative for nausea, vomiting, abdominal pain, diarrhea, constipation, blood in stool and abdominal distention.  Genitourinary: Negative for dysuria, urgency, frequency, hematuria, flank pain, vaginal bleeding, vaginal discharge, difficulty urinating, vaginal pain and pelvic pain.    Musculoskeletal: Negative for joint swelling, arthralgias and gait problem.  Skin: Positive for rash.  Neurological: Negative for dizziness, syncope, speech difficulty, weakness, numbness and headaches.  Hematological: Negative for adenopathy.  Psychiatric/Behavioral: Negative for behavioral problems, dysphoric mood and agitation. The patient is not nervous/anxious.        Objective:   Physical Exam  Constitutional: She is oriented to person, place, and time. She appears well-developed and well-nourished.  Blood pressure 130/74  HENT:  Head: Normocephalic.  Right Ear: External ear normal.  Left Ear: External ear normal.  Mouth/Throat: Oropharynx is clear and moist.  Eyes: Conjunctivae and EOM are normal. Pupils are equal, round, and reactive to light.  Neck: Normal range of motion. Neck supple. No thyromegaly present.  Cardiovascular: Normal rate, normal heart sounds and intact distal pulses.   Irregular rhythm with controlled ventricular response  Pulmonary/Chest: Effort normal and breath sounds normal.  Abdominal: Soft. Bowel sounds are normal. She exhibits no mass. There is no tenderness.  Genitourinary:  A 2 x 2.5 cm lesion consistent with a seborrheic dermatosis right vulvar area  Musculoskeletal: Normal range of motion.  Trace edema  Lymphadenopathy:    She has no cervical adenopathy.  Neurological: She is alert and oriented to person, place, and time.  Skin: Skin is warm and dry. No rash noted.  Psychiatric: She has a normal mood and affect. Her behavior is normal.          Assessment & Plan:   Vulvar lesion consistent with a seborrheic dermatosis Hypertension, stable Chronic atrial fibrillation Chronic Coumadin anticoagulation Chronic venous insufficiency  INR next month as scheduled Return here in 6 months or as needed Follow-up cardiology as scheduled

## 2015-06-03 NOTE — Patient Instructions (Signed)
Limit your sodium (Salt) intake  Return in 6 months for follow-up  

## 2015-06-25 ENCOUNTER — Other Ambulatory Visit: Payer: Self-pay | Admitting: Internal Medicine

## 2015-06-26 ENCOUNTER — Ambulatory Visit (INDEPENDENT_AMBULATORY_CARE_PROVIDER_SITE_OTHER): Payer: Medicare Other | Admitting: General Practice

## 2015-06-26 DIAGNOSIS — Z5181 Encounter for therapeutic drug level monitoring: Secondary | ICD-10-CM

## 2015-06-26 DIAGNOSIS — I4891 Unspecified atrial fibrillation: Secondary | ICD-10-CM | POA: Diagnosis not present

## 2015-06-26 LAB — POCT INR: INR: 2.3

## 2015-06-26 NOTE — Progress Notes (Signed)
Pre visit review using our clinic review tool, if applicable. No additional management support is needed unless otherwise documented below in the visit note. 

## 2015-08-02 ENCOUNTER — Other Ambulatory Visit: Payer: Self-pay | Admitting: Internal Medicine

## 2015-08-07 ENCOUNTER — Ambulatory Visit (INDEPENDENT_AMBULATORY_CARE_PROVIDER_SITE_OTHER): Payer: Medicare Other | Admitting: General Practice

## 2015-08-07 DIAGNOSIS — I4891 Unspecified atrial fibrillation: Secondary | ICD-10-CM

## 2015-08-07 DIAGNOSIS — Z5181 Encounter for therapeutic drug level monitoring: Secondary | ICD-10-CM | POA: Diagnosis not present

## 2015-08-07 LAB — POCT INR: INR: 2.2

## 2015-08-07 NOTE — Progress Notes (Signed)
Pre visit review using our clinic review tool, if applicable. No additional management support is needed unless otherwise documented below in the visit note. 

## 2015-08-12 NOTE — Progress Notes (Signed)
Agree with this plan.

## 2015-09-18 ENCOUNTER — Ambulatory Visit (INDEPENDENT_AMBULATORY_CARE_PROVIDER_SITE_OTHER): Payer: Medicare Other | Admitting: General Practice

## 2015-09-18 DIAGNOSIS — Z5181 Encounter for therapeutic drug level monitoring: Secondary | ICD-10-CM

## 2015-09-18 DIAGNOSIS — I4891 Unspecified atrial fibrillation: Secondary | ICD-10-CM

## 2015-09-18 LAB — POCT INR: INR: 2.3

## 2015-09-18 NOTE — Progress Notes (Signed)
Pre visit review using our clinic review tool, if applicable. No additional management support is needed unless otherwise documented below in the visit note. 

## 2015-09-19 ENCOUNTER — Other Ambulatory Visit: Payer: Self-pay | Admitting: Internal Medicine

## 2015-09-26 ENCOUNTER — Other Ambulatory Visit: Payer: Self-pay | Admitting: Internal Medicine

## 2015-10-30 ENCOUNTER — Ambulatory Visit (INDEPENDENT_AMBULATORY_CARE_PROVIDER_SITE_OTHER): Payer: Medicare Other | Admitting: General Practice

## 2015-10-30 DIAGNOSIS — Z5181 Encounter for therapeutic drug level monitoring: Secondary | ICD-10-CM | POA: Diagnosis not present

## 2015-10-30 DIAGNOSIS — I4891 Unspecified atrial fibrillation: Secondary | ICD-10-CM | POA: Diagnosis not present

## 2015-10-30 LAB — POCT INR: INR: 2.2

## 2015-11-13 ENCOUNTER — Telehealth: Payer: Self-pay | Admitting: Internal Medicine

## 2015-11-13 NOTE — Telephone Encounter (Signed)
Please call pt and schedule an appointment with Dr. Raliegh Ip tomorrow if no openings have her see someone else.

## 2015-11-13 NOTE — Telephone Encounter (Signed)
Starling Manns from [facility] Call [phone]) called wanting to see if the patient can be seen because she has pain under her left breast for about a week and it's a sharp pain that has happened about 3 x this week. The pain comes and goes it's not constant.

## 2015-11-14 ENCOUNTER — Encounter: Payer: Self-pay | Admitting: Internal Medicine

## 2015-11-14 ENCOUNTER — Ambulatory Visit (INDEPENDENT_AMBULATORY_CARE_PROVIDER_SITE_OTHER): Payer: Medicare Other | Admitting: Internal Medicine

## 2015-11-14 VITALS — BP 142/80 | HR 71 | Temp 97.8°F | Resp 20 | Ht 59.5 in | Wt 144.0 lb

## 2015-11-14 DIAGNOSIS — I480 Paroxysmal atrial fibrillation: Secondary | ICD-10-CM | POA: Diagnosis not present

## 2015-11-14 DIAGNOSIS — I10 Essential (primary) hypertension: Secondary | ICD-10-CM | POA: Diagnosis not present

## 2015-11-14 NOTE — Progress Notes (Signed)
Subjective:    Patient ID: Bonnie Roberts, female    DOB: 26-May-1922, 82 y.o.   MRN: [id number]  HPI  82 year old patient who has a history of essential hypertension, paroxysmal atrial fibrillation.  She does remarkably well.  She was seen by a visiting nurse yesterday and gave a complain of some intermittent mild tenderness beneath the left breast.  She was urged to have a office visit for evaluation Otherwise, doing quite well without concerns or complaints.  She remains on Coumadin anticoagulation  Past Medical History  Diagnosis Date  . ANXIETY DEPRESSION 12/11/2009  . Atrial fibrillation ([place]) 04/10/2007  . COLONIC POLYPS, HX OF 04/10/2007  . DISORDER, MENOPAUSAL NOS 05/15/2007  . DYSPNEA 02/11/2009  . Edema 03/13/2009  . HYPERTENSION 04/10/2007  . Osteoarth NOS-Unspec 04/10/2007  . VENOUS INSUFFICIENCY, CHRONIC 01/22/2010    Social History   Social History  . Marital Status: Widowed    Spouse Name: N/A  . Number of Children: 4  . Years of Education: N/A   Occupational History  . RETIRED    Social History Main Topics  . Smoking status: Never Smoker   . Smokeless tobacco: Never Used  . Alcohol Use: No  . Drug Use: No  . Sexual Activity: Not on file   Other Topics Concern  . Not on file   Social History Narrative    Past Surgical History  Procedure Laterality Date  . Cholecystectomy  1965  . Carpal tunnel release    . Abdominal hysterectomy  1959  . Total knee arthroplasty  1993    right  . Rectocele repair    . Bladder surgery      tacking  . Hernia repair    . Cardiac catheterization      Family History  Problem Relation Age of Onset  . CVA Father     Allergies  Allergen Reactions  . Codeine     REACTION: n/v  . Nsaids     REACTION: gib  . Penicillins     REACTION: rash  . Sulfonamide Derivatives     REACTION: rash    Current Outpatient Prescriptions on File Prior to Visit  Medication Sig Dispense Refill  . acetaminophen (TYLENOL) 325 MG  tablet Take 650 mg by mouth as needed.    Marland Kitchen atenolol (TENORMIN) 25 MG tablet TAKE 1 TABLET (25 MG TOTAL) BY MOUTH DAILY. 90 tablet 1  . b complex vitamins capsule Take 1 capsule by mouth daily.      Marland Kitchen diltiazem (CARDIZEM CD) 120 MG 24 hr capsule TAKE 1 CAPSULE (120 MG TOTAL) BY MOUTH DAILY. 90 capsule 2  . docusate sodium (COLACE) 100 MG capsule Take 1 capsule (100 mg total) by mouth daily. 10 capsule   . Flaxseed, Linseed, (BL FLAX SEED OIL) 1000 MG CAPS Take by mouth daily.      . furosemide (LASIX) 40 MG tablet TAKE 1 TABLET (40 MG TOTAL) BY MOUTH 2 (TWO) TIMES DAILY. 180 tablet 3  . KLOR-CON M20 20 MEQ tablet TAKE 1 TABLET BY MOUTH TWICE A DAY 180 tablet 1  . Multiple Vitamins-Minerals (CENTRUM SILVER PO) Take by mouth daily.      . Omega-3 Fatty Acids (FISH OIL) 1000 MG CAPS 2 TABS DAILY    . omeprazole (PRILOSEC) 20 MG capsule TAKE 1 CAPSULE (20 MG TOTAL) BY MOUTH DAILY. 30 capsule 5  . warfarin (COUMADIN) 2 MG tablet TAKE AS DIRECTED BY ANTICOAGULATION CLINIC 90 tablet 1   No current facility-administered  medications on file prior to visit.    BP 142/80 mmHg  Pulse 71  Temp(Src) 97.8 F (36.6 C) (Oral)  Resp 20  Ht 4' 11.5" (1.511 m)  Wt 144 lb (65.318 kg)  BMI 28.61 kg/m2  SpO2 97%     Review of Systems  Constitutional: Negative.   HENT: Negative for congestion, dental problem, hearing loss, rhinorrhea, sinus pressure, sore throat and tinnitus.   Eyes: Negative for pain, discharge and visual disturbance.  Respiratory: Negative for cough and shortness of breath.   Cardiovascular: Positive for chest pain. Negative for palpitations and leg swelling.  Gastrointestinal: Negative for nausea, vomiting, abdominal pain, diarrhea, constipation, blood in stool and abdominal distention.  Genitourinary: Negative for dysuria, urgency, frequency, hematuria, flank pain, vaginal bleeding, vaginal discharge, difficulty urinating, vaginal pain and pelvic pain.  Musculoskeletal: Negative for  joint swelling, arthralgias and gait problem.  Skin: Negative for rash.  Neurological: Negative for dizziness, syncope, speech difficulty, weakness, numbness and headaches.  Hematological: Negative for adenopathy.  Psychiatric/Behavioral: Negative for behavioral problems, dysphoric mood and agitation. The patient is not nervous/anxious.        Objective:   Physical Exam  Constitutional: She is oriented to person, place, and time. She appears well-developed and well-nourished.  HENT:  Head: Normocephalic.  Right Ear: External ear normal.  Left Ear: External ear normal.  Mouth/Throat: Oropharynx is clear and moist.  Eyes: Conjunctivae and EOM are normal. Pupils are equal, round, and reactive to light.  Neck: Normal range of motion. Neck supple. No thyromegaly present.  Cardiovascular: Normal rate, regular rhythm, normal heart sounds and intact distal pulses.   Pulmonary/Chest: Effort normal and breath sounds normal. She exhibits tenderness.  Unremarkable left breast exam No chest wall tenderness or masses Axilla clear  Abdominal: Soft. Bowel sounds are normal. She exhibits no mass. There is no tenderness.  Musculoskeletal: Normal range of motion.  Lymphadenopathy:    She has no cervical adenopathy.  Neurological: She is alert and oriented to person, place, and time.  Skin: Skin is warm and dry. No rash noted.  Psychiatric: She has a normal mood and affect. Her behavior is normal.          Assessment & Plan:   Left chest wall pain.  Normal breast exam.  Normal mammogram 13 months ago Atrial fibrillation.  Continue Coumadin anticoagulation Essential hypertension, stable

## 2015-11-14 NOTE — Patient Instructions (Signed)
Limit your sodium (Salt) intake  Return in 4 months for follow-up  

## 2015-11-14 NOTE — Progress Notes (Signed)
Pre visit review using our clinic review tool, if applicable. No additional management support is needed unless otherwise documented below in the visit note. 

## 2015-11-27 DIAGNOSIS — Z961 Presence of intraocular lens: Secondary | ICD-10-CM | POA: Diagnosis not present

## 2015-11-27 DIAGNOSIS — H11442 Conjunctival cysts, left eye: Secondary | ICD-10-CM | POA: Diagnosis not present

## 2015-11-27 DIAGNOSIS — H40013 Open angle with borderline findings, low risk, bilateral: Secondary | ICD-10-CM | POA: Diagnosis not present

## 2015-12-03 ENCOUNTER — Other Ambulatory Visit: Payer: Self-pay | Admitting: Internal Medicine

## 2015-12-04 ENCOUNTER — Ambulatory Visit: Payer: Medicare Other | Admitting: Internal Medicine

## 2015-12-05 ENCOUNTER — Ambulatory Visit: Payer: Medicare Other | Admitting: Internal Medicine

## 2015-12-10 ENCOUNTER — Ambulatory Visit (INDEPENDENT_AMBULATORY_CARE_PROVIDER_SITE_OTHER): Payer: Medicare Other | Admitting: General Practice

## 2015-12-10 DIAGNOSIS — I4891 Unspecified atrial fibrillation: Secondary | ICD-10-CM | POA: Diagnosis not present

## 2015-12-10 DIAGNOSIS — Z5181 Encounter for therapeutic drug level monitoring: Secondary | ICD-10-CM

## 2015-12-10 LAB — POCT INR: INR: 2.5

## 2015-12-10 NOTE — Progress Notes (Signed)
Pre visit review using our clinic review tool, if applicable. No additional management support is needed unless otherwise documented below in the visit note. 

## 2015-12-23 ENCOUNTER — Other Ambulatory Visit: Payer: Self-pay | Admitting: Internal Medicine

## 2015-12-29 DIAGNOSIS — H11442 Conjunctival cysts, left eye: Secondary | ICD-10-CM | POA: Diagnosis not present

## 2015-12-29 DIAGNOSIS — Z961 Presence of intraocular lens: Secondary | ICD-10-CM | POA: Diagnosis not present

## 2015-12-29 DIAGNOSIS — H40013 Open angle with borderline findings, low risk, bilateral: Secondary | ICD-10-CM | POA: Diagnosis not present

## 2016-01-19 ENCOUNTER — Encounter: Payer: Self-pay | Admitting: Internal Medicine

## 2016-01-19 ENCOUNTER — Ambulatory Visit (INDEPENDENT_AMBULATORY_CARE_PROVIDER_SITE_OTHER): Payer: Medicare Other | Admitting: General Practice

## 2016-01-19 ENCOUNTER — Ambulatory Visit (INDEPENDENT_AMBULATORY_CARE_PROVIDER_SITE_OTHER): Payer: Medicare Other | Admitting: Internal Medicine

## 2016-01-19 VITALS — BP 150/80 | HR 88 | Temp 98.5°F | Resp 20 | Ht 59.5 in | Wt 142.0 lb

## 2016-01-19 DIAGNOSIS — Z5181 Encounter for therapeutic drug level monitoring: Secondary | ICD-10-CM | POA: Diagnosis not present

## 2016-01-19 DIAGNOSIS — Z7901 Long term (current) use of anticoagulants: Secondary | ICD-10-CM | POA: Diagnosis not present

## 2016-01-19 DIAGNOSIS — I480 Paroxysmal atrial fibrillation: Secondary | ICD-10-CM

## 2016-01-19 DIAGNOSIS — G609 Hereditary and idiopathic neuropathy, unspecified: Secondary | ICD-10-CM

## 2016-01-19 DIAGNOSIS — I4891 Unspecified atrial fibrillation: Secondary | ICD-10-CM

## 2016-01-19 DIAGNOSIS — I10 Essential (primary) hypertension: Secondary | ICD-10-CM | POA: Diagnosis not present

## 2016-01-19 LAB — POCT INR: INR: 2.8

## 2016-01-19 MED ORDER — GABAPENTIN 100 MG PO CAPS: 100.0000 mg | ORAL_CAPSULE | Freq: Three times a day (TID) | ORAL | Status: DC

## 2016-01-19 MED ORDER — TRAMADOL HCL 50 MG PO TABS: 50.0000 mg | ORAL_TABLET | Freq: Four times a day (QID) | ORAL | Status: DC | PRN

## 2016-01-19 NOTE — Progress Notes (Signed)
Pre visit review using our clinic review tool, if applicable. No additional management support is needed unless otherwise documented below in the visit note. 

## 2016-01-19 NOTE — Patient Instructions (Signed)
Return in one month for follow-up  

## 2016-01-19 NOTE — Progress Notes (Signed)
Subjective:    Patient ID: Bonnie Roberts, female    DOB: 10-Jun-1922, 82 y.o.   MRN: [id number]  HPI  82 year old patient who has a history of chronic venous insufficiency.  She has essential hypertension and a history of atrial fibrillation.  She remains on chronic Coumadin anticoagulation. For the past few months she has noted worsening lower extremity symptoms.  She describes bilateral numbness, tingling, and at times a burning quality involving her lower extremities, worse involving the feet Symptoms are not aggravated by ambulation and seemed to improve with walking. Symptoms at times seem to be worse with elevation.  She does use support hose.  Past Medical History  Diagnosis Date  . ANXIETY DEPRESSION 12/11/2009  . Atrial fibrillation ([place]) 04/10/2007  . COLONIC POLYPS, HX OF 04/10/2007  . DISORDER, MENOPAUSAL NOS 05/15/2007  . DYSPNEA 02/11/2009  . Edema 03/13/2009  . HYPERTENSION 04/10/2007  . Osteoarth NOS-Unspec 04/10/2007  . VENOUS INSUFFICIENCY, CHRONIC 01/22/2010    Social History   Social History  . Marital Status: Widowed    Spouse Name: N/A  . Number of Children: 4  . Years of Education: N/A   Occupational History  . RETIRED    Social History Main Topics  . Smoking status: Never Smoker   . Smokeless tobacco: Never Used  . Alcohol Use: No  . Drug Use: No  . Sexual Activity: Not on file   Other Topics Concern  . Not on file   Social History Narrative    Past Surgical History  Procedure Laterality Date  . Cholecystectomy  1965  . Carpal tunnel release    . Abdominal hysterectomy  1959  . Total knee arthroplasty  1993    right  . Rectocele repair    . Bladder surgery      tacking  . Hernia repair    . Cardiac catheterization      Family History  Problem Relation Age of Onset  . CVA Father     Allergies  Allergen Reactions  . Codeine     REACTION: n/v  . Nsaids     REACTION: gib  . Penicillins     REACTION: rash  . Sulfonamide Derivatives      REACTION: rash    Current Outpatient Prescriptions on File Prior to Visit  Medication Sig Dispense Refill  . acetaminophen (TYLENOL) 325 MG tablet Take 650 mg by mouth as needed.    Marland Kitchen atenolol (TENORMIN) 25 MG tablet TAKE 1 TABLET (25 MG TOTAL) BY MOUTH DAILY. 90 tablet 1  . b complex vitamins capsule Take 1 capsule by mouth daily.      Marland Kitchen diltiazem (CARDIZEM CD) 120 MG 24 hr capsule TAKE 1 CAPSULE (120 MG TOTAL) BY MOUTH DAILY. 90 capsule 2  . docusate sodium (COLACE) 100 MG capsule Take 1 capsule (100 mg total) by mouth daily. 10 capsule   . Flaxseed, Linseed, (BL FLAX SEED OIL) 1000 MG CAPS Take by mouth daily.      . furosemide (LASIX) 40 MG tablet TAKE 1 TABLET (40 MG TOTAL) BY MOUTH 2 (TWO) TIMES DAILY. 180 tablet 3  . KLOR-CON M20 20 MEQ tablet TAKE 1 TABLET BY MOUTH TWICE A DAY 180 tablet 1  . Multiple Vitamins-Minerals (CENTRUM SILVER PO) Take by mouth daily.      . Omega-3 Fatty Acids (FISH OIL) 1000 MG CAPS 2 TABS DAILY    . omeprazole (PRILOSEC) 20 MG capsule TAKE 1 CAPSULE (20 MG TOTAL) BY MOUTH DAILY. [place]  capsule 5  . warfarin (COUMADIN) 2 MG tablet TAKE AS DIRECTED BY ANTICOAGULATION CLINIC 90 tablet 1   No current facility-administered medications on file prior to visit.    BP 150/80 mmHg  Pulse 88  Temp(Src) 98.5 F (36.9 C) (Oral)  Resp 20  Ht 4' 11.5" (1.511 m)  Wt 142 lb (64.411 kg)  BMI 28.21 kg/m2  SpO2 98%     Review of Systems  Constitutional: Negative.   HENT: Negative for congestion, dental problem, hearing loss, rhinorrhea, sinus pressure, sore throat and tinnitus.   Eyes: Negative for pain, discharge and visual disturbance.  Respiratory: Negative for cough and shortness of breath.   Cardiovascular: Negative for chest pain, palpitations and leg swelling.  Gastrointestinal: Negative for nausea, vomiting, abdominal pain, diarrhea, constipation, blood in stool and abdominal distention.  Genitourinary: Negative for dysuria, urgency, frequency,  hematuria, flank pain, vaginal bleeding, vaginal discharge, difficulty urinating, vaginal pain and pelvic pain.  Musculoskeletal: Negative for joint swelling, arthralgias and gait problem.  Skin: Negative for rash.  Neurological: Positive for numbness. Negative for dizziness, syncope, speech difficulty, weakness and headaches.  Hematological: Negative for adenopathy.  Psychiatric/Behavioral: Negative for behavioral problems, dysphoric mood and agitation. The patient is not nervous/anxious.        Objective:   Physical Exam  Constitutional: She appears well-developed and well-nourished. No distress.  Blood pressure 140/80  Cardiovascular:  Dorsalis pedis pulses full.  Posterior tibial pulses not easily palpable  Musculoskeletal:  Slight edema left ankle Status post right total knee replacement  Neurological:  Left patellar reflex intact Achilles reflexes absent Absent vibratory sensation Intact monofilament testing  Skin:  Feet slightly cool to touch but no ischemic changes Scattered varicosities          Assessment & Plan:   Lower extremity dysesthesias.  Doubt arterial insufficiency sounds more consistent with a peripheral neuropathy.  Options discussed.  Patient like a trial of medications before further diagnostic evaluation.  Will place on tramadol when necessary as well as Neurontin  Return in one month for follow-up

## 2016-02-02 ENCOUNTER — Other Ambulatory Visit: Payer: Self-pay | Admitting: Internal Medicine

## 2016-03-01 ENCOUNTER — Ambulatory Visit (INDEPENDENT_AMBULATORY_CARE_PROVIDER_SITE_OTHER): Payer: Medicare Other | Admitting: General Practice

## 2016-03-01 DIAGNOSIS — Z5181 Encounter for therapeutic drug level monitoring: Secondary | ICD-10-CM

## 2016-03-01 DIAGNOSIS — I4891 Unspecified atrial fibrillation: Secondary | ICD-10-CM

## 2016-03-01 LAB — POCT INR: INR: 2.7

## 2016-03-01 NOTE — Progress Notes (Signed)
Pre visit review using our clinic review tool, if applicable. No additional management support is needed unless otherwise documented below in the visit note. 

## 2016-03-06 DIAGNOSIS — A932 Colorado tick fever: Secondary | ICD-10-CM | POA: Diagnosis not present

## 2016-03-27 ENCOUNTER — Other Ambulatory Visit: Payer: Self-pay | Admitting: Internal Medicine

## 2016-04-02 ENCOUNTER — Encounter: Payer: Self-pay | Admitting: Internal Medicine

## 2016-04-02 ENCOUNTER — Ambulatory Visit (INDEPENDENT_AMBULATORY_CARE_PROVIDER_SITE_OTHER): Payer: Medicare Other | Admitting: Internal Medicine

## 2016-04-02 VITALS — BP 150/80 | HR 74 | Temp 98.7°F | Resp 18 | Ht 59.5 in | Wt 142.0 lb

## 2016-04-02 DIAGNOSIS — I482 Chronic atrial fibrillation, unspecified: Secondary | ICD-10-CM

## 2016-04-02 DIAGNOSIS — Z7901 Long term (current) use of anticoagulants: Secondary | ICD-10-CM

## 2016-04-02 DIAGNOSIS — R609 Edema, unspecified: Secondary | ICD-10-CM | POA: Diagnosis not present

## 2016-04-02 DIAGNOSIS — I4821 Permanent atrial fibrillation: Secondary | ICD-10-CM | POA: Insufficient documentation

## 2016-04-02 DIAGNOSIS — Z5181 Encounter for therapeutic drug level monitoring: Secondary | ICD-10-CM | POA: Diagnosis not present

## 2016-04-02 DIAGNOSIS — I10 Essential (primary) hypertension: Secondary | ICD-10-CM

## 2016-04-02 DIAGNOSIS — C44212 Basal cell carcinoma of skin of right ear and external auricular canal: Secondary | ICD-10-CM

## 2016-04-02 LAB — POCT INR: INR: 3.1

## 2016-04-02 NOTE — Progress Notes (Signed)
Pre visit review using our clinic review tool, if applicable. No additional management support is needed unless otherwise documented below in the visit note. 

## 2016-04-02 NOTE — Progress Notes (Signed)
Subjective:    Patient ID: Bonnie Roberts, female    DOB: May 12, 1922, 82 y.o.   MRN: [id number]  HPI 82 year old patient who is seen today for her quarterly follow-up.  She has a history of permanent atrial fibrillation, chronic venous insufficiency as well as essential hypertension.  Remains on Coumadin anticoagulation and is scheduled for INR today. She complains of some general fatigue.  She has had chronic left leg edema.  This changes. Additional complaints include a small papule involving the left upper eyelid.  She has seen Dr. Bing Plume in the past.  Past Medical History  Diagnosis Date  . ANXIETY DEPRESSION 12/11/2009  . Atrial fibrillation ([place]) 04/10/2007  . COLONIC POLYPS, HX OF 04/10/2007  . DISORDER, MENOPAUSAL NOS 05/15/2007  . DYSPNEA 02/11/2009  . Edema 03/13/2009  . HYPERTENSION 04/10/2007  . Osteoarth NOS-Unspec 04/10/2007  . VENOUS INSUFFICIENCY, CHRONIC 01/22/2010     Social History   Social History  . Marital Status: Widowed    Spouse Name: N/A  . Number of Children: 4  . Years of Education: N/A   Occupational History  . RETIRED    Social History Main Topics  . Smoking status: Never Smoker   . Smokeless tobacco: Never Used  . Alcohol Use: No  . Drug Use: No  . Sexual Activity: Not on file   Other Topics Concern  . Not on file   Social History Narrative    Past Surgical History  Procedure Laterality Date  . Cholecystectomy  1965  . Carpal tunnel release    . Abdominal hysterectomy  1959  . Total knee arthroplasty  1993    right  . Rectocele repair    . Bladder surgery      tacking  . Hernia repair    . Cardiac catheterization      Family History  Problem Relation Age of Onset  . CVA Father     Allergies  Allergen Reactions  . Codeine     REACTION: n/v  . Nsaids     REACTION: gib  . Penicillins     REACTION: rash  . Sulfonamide Derivatives     REACTION: rash    Current Outpatient Prescriptions on File Prior to Visit  Medication Sig  Dispense Refill  . acetaminophen (TYLENOL) 325 MG tablet Take 650 mg by mouth as needed.    Marland Kitchen atenolol (TENORMIN) 25 MG tablet TAKE 1 TABLET (25 MG TOTAL) BY MOUTH DAILY. 90 tablet 1  . b complex vitamins capsule Take 1 capsule by mouth daily.      Marland Kitchen diltiazem (CARDIZEM CD) 120 MG 24 hr capsule TAKE ONE CAPSULE BY MOUTH EVERY DAY 90 capsule 2  . docusate sodium (COLACE) 100 MG capsule Take 1 capsule (100 mg total) by mouth daily. 10 capsule   . Flaxseed, Linseed, (BL FLAX SEED OIL) 1000 MG CAPS Take by mouth daily.      . furosemide (LASIX) 40 MG tablet TAKE 1 TABLET (40 MG TOTAL) BY MOUTH 2 (TWO) TIMES DAILY. 180 tablet 3  . KLOR-CON M20 20 MEQ tablet TAKE 1 TABLET BY MOUTH TWICE A DAY 180 tablet 1  . Multiple Vitamins-Minerals (CENTRUM SILVER PO) Take by mouth daily.      . Omega-3 Fatty Acids (FISH OIL) 1000 MG CAPS 2 TABS DAILY    . omeprazole (PRILOSEC) 20 MG capsule TAKE 1 CAPSULE (20 MG TOTAL) BY MOUTH DAILY. 30 capsule 5  . warfarin (COUMADIN) 2 MG tablet TAKE AS DIRECTED BY ANTICOAGULATION  CLINIC 90 tablet 2   No current facility-administered medications on file prior to visit.    BP 150/80 mmHg  Pulse 74  Temp(Src) 98.7 F (37.1 C) (Oral)  Resp 18  Ht 4' 11.5" (1.511 m)  Wt 142 lb (64.411 kg)  BMI 28.21 kg/m2  SpO2 96%      Review of Systems  Constitutional: Positive for fatigue.  HENT: Negative for congestion, dental problem, hearing loss, rhinorrhea, sinus pressure, sore throat and tinnitus.   Eyes: Negative for pain, discharge and visual disturbance.  Respiratory: Negative for cough and shortness of breath.   Cardiovascular: Positive for leg swelling. Negative for chest pain and palpitations.  Gastrointestinal: Negative for nausea, vomiting, abdominal pain, diarrhea, constipation, blood in stool and abdominal distention.  Genitourinary: Negative for dysuria, urgency, frequency, hematuria, flank pain, vaginal bleeding, vaginal discharge, difficulty urinating, vaginal  pain and pelvic pain.  Musculoskeletal: Negative for joint swelling, arthralgias and gait problem.  Skin: Positive for wound. Negative for rash.  Neurological: Positive for weakness. Negative for dizziness, syncope, speech difficulty, numbness and headaches.  Hematological: Negative for adenopathy.  Psychiatric/Behavioral: Negative for behavioral problems, dysphoric mood and agitation. The patient is not nervous/anxious.        Objective:   Physical Exam  Constitutional: She is oriented to person, place, and time. She appears well-developed and well-nourished.  HENT:  Head: Normocephalic.  Right Ear: External ear normal.  Left Ear: External ear normal.  Mouth/Throat: Oropharynx is clear and moist.  Eyes: Conjunctivae and EOM are normal. Pupils are equal, round, and reactive to light.  Tiny papular lesion, left upper inner eyelid  Neck: Normal range of motion. Neck supple. No thyromegaly present.  Cardiovascular: Normal rate, normal heart sounds and intact distal pulses.   Control ventricular rate  Pulmonary/Chest: Effort normal and breath sounds normal.  Abdominal: Soft. Bowel sounds are normal. She exhibits no mass. There is no tenderness.  Musculoskeletal: Normal range of motion. She exhibits edema.  Chronic  left distal leg edema  Lymphadenopathy:    She has no cervical adenopathy.  Neurological: She is alert and oriented to person, place, and time.  Skin: Skin is warm and dry. No rash noted.  Small ulcerated lesion left ear consistent with BCE  Psychiatric: She has a normal mood and affect. Her behavior is normal.          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  Will refer to dermatology Chronic atrial fibrillation Chronic venous insufficiency Stye, left eyelid versus other benign lesion.  Patient will follow-up with ophthalmology  Recheck 3-4 months  Nyoka Cowden, MD

## 2016-04-02 NOTE — Patient Instructions (Signed)
Dermatology consultation  Ophthalmology follow-up  Limit your sodium (Salt) intake  Return in 3 months for follow-up

## 2016-04-05 ENCOUNTER — Ambulatory Visit (INDEPENDENT_AMBULATORY_CARE_PROVIDER_SITE_OTHER): Payer: Medicare Other | Admitting: General Practice

## 2016-04-05 ENCOUNTER — Ambulatory Visit: Payer: Medicare Other

## 2016-04-05 DIAGNOSIS — I4891 Unspecified atrial fibrillation: Secondary | ICD-10-CM

## 2016-04-05 DIAGNOSIS — Z5181 Encounter for therapeutic drug level monitoring: Secondary | ICD-10-CM

## 2016-04-05 NOTE — Progress Notes (Signed)
Pre visit review using our clinic review tool, if applicable. No additional management support is needed unless otherwise documented below in the visit note. 

## 2016-04-12 ENCOUNTER — Telehealth: Payer: Self-pay | Admitting: Internal Medicine

## 2016-04-12 NOTE — Telephone Encounter (Signed)
error 

## 2016-04-14 ENCOUNTER — Other Ambulatory Visit: Payer: Self-pay | Admitting: Internal Medicine

## 2016-04-15 DIAGNOSIS — H16102 Unspecified superficial keratitis, left eye: Secondary | ICD-10-CM | POA: Diagnosis not present

## 2016-04-15 DIAGNOSIS — H02824 Cysts of left upper eyelid: Secondary | ICD-10-CM | POA: Diagnosis not present

## 2016-04-15 DIAGNOSIS — H524 Presbyopia: Secondary | ICD-10-CM | POA: Diagnosis not present

## 2016-04-27 DIAGNOSIS — D485 Neoplasm of uncertain behavior of skin: Secondary | ICD-10-CM | POA: Diagnosis not present

## 2016-04-27 DIAGNOSIS — L723 Sebaceous cyst: Secondary | ICD-10-CM | POA: Diagnosis not present

## 2016-04-27 DIAGNOSIS — L821 Other seborrheic keratosis: Secondary | ICD-10-CM | POA: Diagnosis not present

## 2016-04-27 DIAGNOSIS — L308 Other specified dermatitis: Secondary | ICD-10-CM | POA: Diagnosis not present

## 2016-04-28 DIAGNOSIS — H61001 Unspecified perichondritis of right external ear: Secondary | ICD-10-CM | POA: Diagnosis not present

## 2016-05-03 ENCOUNTER — Ambulatory Visit: Payer: Medicare Other

## 2016-05-03 DIAGNOSIS — H02824 Cysts of left upper eyelid: Secondary | ICD-10-CM | POA: Diagnosis not present

## 2016-05-05 ENCOUNTER — Ambulatory Visit (INDEPENDENT_AMBULATORY_CARE_PROVIDER_SITE_OTHER): Payer: Medicare Other | Admitting: General Practice

## 2016-05-05 DIAGNOSIS — I4891 Unspecified atrial fibrillation: Secondary | ICD-10-CM | POA: Diagnosis not present

## 2016-05-05 DIAGNOSIS — Z5181 Encounter for therapeutic drug level monitoring: Secondary | ICD-10-CM | POA: Diagnosis not present

## 2016-05-05 LAB — POCT INR: INR: 2.7

## 2016-06-01 ENCOUNTER — Encounter: Payer: Self-pay | Admitting: Internal Medicine

## 2016-06-16 ENCOUNTER — Ambulatory Visit: Payer: Medicare Other

## 2016-06-21 ENCOUNTER — Ambulatory Visit (INDEPENDENT_AMBULATORY_CARE_PROVIDER_SITE_OTHER): Payer: Medicare Other | Admitting: General Practice

## 2016-06-21 ENCOUNTER — Other Ambulatory Visit: Payer: Self-pay | Admitting: Internal Medicine

## 2016-06-21 DIAGNOSIS — I4891 Unspecified atrial fibrillation: Secondary | ICD-10-CM | POA: Diagnosis not present

## 2016-06-21 DIAGNOSIS — Z5181 Encounter for therapeutic drug level monitoring: Secondary | ICD-10-CM | POA: Diagnosis not present

## 2016-06-21 LAB — POCT INR: INR: 4.2

## 2016-07-06 ENCOUNTER — Ambulatory Visit (INDEPENDENT_AMBULATORY_CARE_PROVIDER_SITE_OTHER): Payer: Medicare Other | Admitting: Internal Medicine

## 2016-07-06 ENCOUNTER — Encounter: Payer: Self-pay | Admitting: Internal Medicine

## 2016-07-06 VITALS — BP 156/80 | HR 80 | Temp 98.7°F | Resp 20 | Ht 59.5 in | Wt 143.2 lb

## 2016-07-06 DIAGNOSIS — Z7901 Long term (current) use of anticoagulants: Secondary | ICD-10-CM | POA: Diagnosis not present

## 2016-07-06 DIAGNOSIS — I872 Venous insufficiency (chronic) (peripheral): Secondary | ICD-10-CM

## 2016-07-06 DIAGNOSIS — I481 Persistent atrial fibrillation: Secondary | ICD-10-CM

## 2016-07-06 DIAGNOSIS — I10 Essential (primary) hypertension: Secondary | ICD-10-CM

## 2016-07-06 DIAGNOSIS — I4819 Other persistent atrial fibrillation: Secondary | ICD-10-CM

## 2016-07-06 LAB — CBC WITH DIFFERENTIAL/PLATELET
BASOS ABS: 0 10*3/uL (ref 0.0–0.1)
Basophils Relative: 0.7 % (ref 0.0–3.0)
EOS ABS: 0.1 10*3/uL (ref 0.0–0.7)
Eosinophils Relative: 1.7 % (ref 0.0–5.0)
HEMATOCRIT: 41.1 % (ref 36.0–46.0)
Hemoglobin: 13.8 g/dL (ref 12.0–15.0)
LYMPHS PCT: 29.6 % (ref 12.0–46.0)
Lymphs Abs: 1.8 10*3/uL (ref 0.7–4.0)
MCHC: 33.6 g/dL (ref 30.0–36.0)
MCV: 94.9 fl (ref 78.0–100.0)
Monocytes Absolute: 0.5 10*3/uL (ref 0.1–1.0)
Monocytes Relative: 8 % (ref 3.0–12.0)
NEUTROS ABS: 3.6 10*3/uL (ref 1.4–7.7)
Neutrophils Relative %: 60 % (ref 43.0–77.0)
PLATELETS: 191 10*3/uL (ref 150.0–400.0)
RBC: 4.34 Mil/uL (ref 3.87–5.11)
RDW: 14.6 % (ref 11.5–15.5)
WBC: 6 10*3/uL (ref 4.0–10.5)

## 2016-07-06 LAB — COMPREHENSIVE METABOLIC PANEL
ALBUMIN: 3.8 g/dL (ref 3.5–5.2)
ALK PHOS: 74 U/L (ref 39–117)
ALT: 14 U/L (ref 0–35)
AST: 20 U/L (ref 0–37)
BILIRUBIN TOTAL: 0.9 mg/dL (ref 0.2–1.2)
BUN: 27 mg/dL — ABNORMAL HIGH (ref 6–23)
CALCIUM: 8.9 mg/dL (ref 8.4–10.5)
CO2: 34 meq/L — AB (ref 19–32)
CREATININE: 1.2 mg/dL (ref 0.40–1.20)
Chloride: 103 mEq/L (ref 96–112)
GFR: 44.68 mL/min — AB (ref >60.00)
Glucose, Bld: 87 mg/dL (ref 70–99)
Potassium: 4 mEq/L (ref 3.5–5.1)
Sodium: 145 mEq/L (ref 135–145)
TOTAL PROTEIN: 6.7 g/dL (ref 6.0–8.3)

## 2016-07-06 LAB — TSH: TSH: 1.87 u[IU]/mL (ref 0.35–4.50)

## 2016-07-06 NOTE — Patient Instructions (Signed)
Limit your sodium (Salt) intake  Return in 6 months for follow-up  

## 2016-07-06 NOTE — Progress Notes (Signed)
Subjective:    Patient ID: Bonnie Roberts, female    DOB: October 19, 1921, 82 y.o.   MRN: [id number]  HPI  82 year old patient who is seen today for general follow-up.  She has a history of essential hypertension.  She has a history also of permanent atrial fibrillation and remains on chronic Coumadin anticoagulation.  She was recently supratherapeutic and her Coumadin dosing adjusted.  She has chronic venous insufficiency and chronic lower extremity edema.  This has been fairly stable.  She has osteoarthritis. No new concerns or complaints  Past Medical History:  Diagnosis Date  . ANXIETY DEPRESSION 12/11/2009  . Atrial fibrillation ([place]) 04/10/2007  . COLONIC POLYPS, HX OF 04/10/2007  . DISORDER, MENOPAUSAL NOS 05/15/2007  . DYSPNEA 02/11/2009  . Edema 03/13/2009  . HYPERTENSION 04/10/2007  . Osteoarth NOS-Unspec 04/10/2007  . VENOUS INSUFFICIENCY, CHRONIC 01/22/2010     Social History   Social History  . Marital status: Widowed    Spouse name: N/A  . Number of children: 4  . Years of education: N/A   Occupational History  . RETIRED Retired   Social History Main Topics  . Smoking status: Never Smoker  . Smokeless tobacco: Never Used  . Alcohol use No  . Drug use: No  . Sexual activity: Not on file   Other Topics Concern  . Not on file   Social History Narrative  . No narrative on file    Past Surgical History:  Procedure Laterality Date  . ABDOMINAL HYSTERECTOMY  1959  . BLADDER SURGERY     tacking  . CARDIAC CATHETERIZATION    . CARPAL TUNNEL RELEASE    . CHOLECYSTECTOMY  1965  . HERNIA REPAIR    . RECTOCELE REPAIR    . TOTAL KNEE ARTHROPLASTY  1993   right    Family History  Problem Relation Age of Onset  . CVA Father     Allergies  Allergen Reactions  . Codeine     REACTION: n/v  . Nsaids     REACTION: gib  . Penicillins     REACTION: rash  . Sulfonamide Derivatives     REACTION: rash    Current Outpatient Prescriptions on File Prior to Visit    Medication Sig Dispense Refill  . acetaminophen (TYLENOL) 325 MG tablet Take 650 mg by mouth as needed.    Marland Kitchen atenolol (TENORMIN) 25 MG tablet TAKE 1 TABLET (25 MG TOTAL) BY MOUTH DAILY. 90 tablet 1  . b complex vitamins capsule Take 1 capsule by mouth daily.      Marland Kitchen diltiazem (CARDIZEM CD) 120 MG 24 hr capsule TAKE ONE CAPSULE BY MOUTH EVERY DAY 90 capsule 2  . docusate sodium (COLACE) 100 MG capsule Take 1 capsule (100 mg total) by mouth daily. 10 capsule   . Flaxseed, Linseed, (BL FLAX SEED OIL) 1000 MG CAPS Take by mouth daily.      . furosemide (LASIX) 40 MG tablet TAKE 1 TABLET (40 MG TOTAL) BY MOUTH 2 (TWO) TIMES DAILY. 180 tablet 3  . KLOR-CON M20 20 MEQ tablet TAKE 1 TABLET BY MOUTH TWICE A DAY 180 tablet 1  . Multiple Vitamins-Minerals (CENTRUM SILVER PO) Take by mouth daily.      . Omega-3 Fatty Acids (FISH OIL) 1000 MG CAPS 2 TABS DAILY    . omeprazole (PRILOSEC) 20 MG capsule TAKE 1 CAPSULE (20 MG TOTAL) BY MOUTH DAILY. 30 capsule 11  . warfarin (COUMADIN) 2 MG tablet TAKE AS DIRECTED BY ANTICOAGULATION CLINIC  90 tablet 2   No current facility-administered medications on file prior to visit.     BP (!) 156/80 (BP Location: Left Arm, Patient Position: Sitting, Cuff Size: Normal)   Pulse 80   Temp 98.7 F (37.1 C) (Oral)   Resp 20   Ht 4' 11.5" (1.511 m)   Wt 143 lb 4 oz (65 kg)   SpO2 98%   BMI 28.45 kg/m     Review of Systems  Constitutional: Negative.   HENT: Negative for congestion, dental problem, hearing loss, rhinorrhea, sinus pressure, sore throat and tinnitus.   Eyes: Negative for pain, discharge and visual disturbance.  Respiratory: Negative for cough and shortness of breath.   Cardiovascular: Positive for leg swelling. Negative for chest pain and palpitations.  Gastrointestinal: Negative for abdominal distention, abdominal pain, blood in stool, constipation, diarrhea, nausea and vomiting.  Genitourinary: Negative for difficulty urinating, dysuria, flank  pain, frequency, hematuria, pelvic pain, urgency, vaginal bleeding, vaginal discharge and vaginal pain.  Musculoskeletal: Negative for arthralgias, gait problem and joint swelling.  Skin: Negative for rash.  Neurological: Negative for dizziness, syncope, speech difficulty, weakness, numbness and headaches.  Hematological: Negative for adenopathy.  Psychiatric/Behavioral: Negative for agitation, behavioral problems and dysphoric mood. The patient is not nervous/anxious.        Objective:   Physical Exam  Constitutional: She is oriented to person, place, and time. She appears well-developed and well-nourished.  HENT:  Head: Normocephalic.  Right Ear: External ear normal.  Left Ear: External ear normal.  Mouth/Throat: Oropharynx is clear and moist.  Eyes: Conjunctivae and EOM are normal. Pupils are equal, round, and reactive to light.  Neck: Normal range of motion. Neck supple. No thyromegaly present.  Cardiovascular: Normal rate, regular rhythm, normal heart sounds and intact distal pulses.   Pulmonary/Chest: Effort normal and breath sounds normal.  Abdominal: Soft. Bowel sounds are normal. She exhibits no mass. There is no tenderness.  Musculoskeletal: Normal range of motion. She exhibits edema.  Plus 2 pedal edema, left greater than right  Lymphadenopathy:    She has no cervical adenopathy.  Neurological: She is alert and oriented to person, place, and time.  Skin: Skin is warm and dry. No rash noted.  Psychiatric: She has a normal mood and affect. Her behavior is normal.          Assessment & Plan:   .  Essential hypertension, stable .  Chronic venous insufficiency , chronic atrial fibrillation , chronic Coumadin anticoagulation  .  No change in therapy  , follow 3-6 months  Nyoka Cowden

## 2016-07-19 ENCOUNTER — Ambulatory Visit (INDEPENDENT_AMBULATORY_CARE_PROVIDER_SITE_OTHER): Payer: Medicare Other | Admitting: General Practice

## 2016-07-19 DIAGNOSIS — Z5181 Encounter for therapeutic drug level monitoring: Secondary | ICD-10-CM

## 2016-07-19 DIAGNOSIS — I4891 Unspecified atrial fibrillation: Secondary | ICD-10-CM | POA: Diagnosis not present

## 2016-07-19 LAB — POCT INR: INR: 2.5

## 2016-08-03 ENCOUNTER — Telehealth: Payer: Self-pay | Admitting: Internal Medicine

## 2016-08-03 NOTE — Telephone Encounter (Signed)
Pt would like to know if she comes in with her daughter on 11/25 at 11:15 can Dr. Raliegh Ip check her Bp and heart rate going up and down refused team health

## 2016-08-03 NOTE — Telephone Encounter (Signed)
Pt needs to schedule an appt to see him due to documenting.

## 2016-08-03 NOTE — Telephone Encounter (Signed)
Pt scheduled  

## 2016-08-04 ENCOUNTER — Encounter: Payer: Self-pay | Admitting: Internal Medicine

## 2016-08-04 ENCOUNTER — Ambulatory Visit (INDEPENDENT_AMBULATORY_CARE_PROVIDER_SITE_OTHER): Payer: Medicare Other | Admitting: Internal Medicine

## 2016-08-04 VITALS — BP 156/80 | HR 68 | Temp 98.0°F | Resp 18 | Ht 59.5 in | Wt 139.0 lb

## 2016-08-04 DIAGNOSIS — I482 Chronic atrial fibrillation, unspecified: Secondary | ICD-10-CM

## 2016-08-04 DIAGNOSIS — I872 Venous insufficiency (chronic) (peripheral): Secondary | ICD-10-CM | POA: Diagnosis not present

## 2016-08-04 DIAGNOSIS — I10 Essential (primary) hypertension: Secondary | ICD-10-CM | POA: Diagnosis not present

## 2016-08-04 MED ORDER — ATENOLOL 25 MG PO TABS: 12.5000 mg | ORAL_TABLET | Freq: Every day | ORAL | 1 refills | Status: DC

## 2016-08-04 NOTE — Progress Notes (Signed)
Subjective:    Patient ID: Bonnie Roberts, female    DOB: 05/02/1922, 82 y.o.   MRN: [id number]  HPI  82 year old patient who has chronic atrial fibrillation.  She has essential hypertension.  6 days ago.  She had an episode of dizziness associated with some low blood pressure readings.  She also has chronic venous insufficiency and does take twice a day dosing of furosemide.  Over the past few days she has felt improved but still having some occasional dizziness.  Past Medical History:  Diagnosis Date  . ANXIETY DEPRESSION 12/11/2009  . Atrial fibrillation ([place]) 04/10/2007  . COLONIC POLYPS, HX OF 04/10/2007  . DISORDER, MENOPAUSAL NOS 05/15/2007  . DYSPNEA 02/11/2009  . Edema 03/13/2009  . HYPERTENSION 04/10/2007  . Osteoarth NOS-Unspec 04/10/2007  . VENOUS INSUFFICIENCY, CHRONIC 01/22/2010     Social History   Social History  . Marital status: Widowed    Spouse name: N/A  . Number of children: 4  . Years of education: N/A   Occupational History  . RETIRED Retired   Social History Main Topics  . Smoking status: Never Smoker  . Smokeless tobacco: Never Used  . Alcohol use No  . Drug use: No  . Sexual activity: Not on file   Other Topics Concern  . Not on file   Social History Narrative  . No narrative on file    Past Surgical History:  Procedure Laterality Date  . ABDOMINAL HYSTERECTOMY  1959  . BLADDER SURGERY     tacking  . CARDIAC CATHETERIZATION    . CARPAL TUNNEL RELEASE    . CHOLECYSTECTOMY  1965  . HERNIA REPAIR    . RECTOCELE REPAIR    . TOTAL KNEE ARTHROPLASTY  1993   right    Family History  Problem Relation Age of Onset  . CVA Father     Allergies  Allergen Reactions  . Codeine     REACTION: n/v  . Nsaids     REACTION: gib  . Penicillins     REACTION: rash  . Sulfonamide Derivatives     REACTION: rash    Current Outpatient Prescriptions on File Prior to Visit  Medication Sig Dispense Refill  . acetaminophen (TYLENOL) 325 MG tablet Take  650 mg by mouth as needed.    Marland Kitchen b complex vitamins capsule Take 1 capsule by mouth daily.      Marland Kitchen diltiazem (CARDIZEM CD) 120 MG 24 hr capsule TAKE ONE CAPSULE BY MOUTH EVERY DAY 90 capsule 2  . docusate sodium (COLACE) 100 MG capsule Take 1 capsule (100 mg total) by mouth daily. 10 capsule   . Flaxseed, Linseed, (BL FLAX SEED OIL) 1000 MG CAPS Take by mouth daily.      . furosemide (LASIX) 40 MG tablet TAKE 1 TABLET (40 MG TOTAL) BY MOUTH 2 (TWO) TIMES DAILY. 180 tablet 3  . KLOR-CON M20 20 MEQ tablet TAKE 1 TABLET BY MOUTH TWICE A DAY 180 tablet 1  . Multiple Vitamins-Minerals (CENTRUM SILVER PO) Take by mouth daily.      . Omega-3 Fatty Acids (FISH OIL) 1000 MG CAPS 2 TABS DAILY    . omeprazole (PRILOSEC) 20 MG capsule TAKE 1 CAPSULE (20 MG TOTAL) BY MOUTH DAILY. 30 capsule 11  . warfarin (COUMADIN) 2 MG tablet TAKE AS DIRECTED BY ANTICOAGULATION CLINIC 90 tablet 2   No current facility-administered medications on file prior to visit.     BP (!) 156/80 (BP Location: Left Arm, Patient Position:  Sitting, Cuff Size: Normal)   Pulse 68   Temp 98 F (36.7 C) (Oral)   Resp 18   Ht 4' 11.5" (1.511 m)   Wt 139 lb (63 kg)   SpO2 98%   BMI 27.60 kg/m     Review of Systems  HENT: Negative for congestion, dental problem, hearing loss, rhinorrhea, sinus pressure, sore throat and tinnitus.   Eyes: Negative for pain, discharge and visual disturbance.  Respiratory: Negative for cough and shortness of breath.   Cardiovascular: Negative for chest pain, palpitations and leg swelling.  Gastrointestinal: Negative for abdominal distention, abdominal pain, blood in stool, constipation, diarrhea, nausea and vomiting.  Genitourinary: Negative for difficulty urinating, dysuria, flank pain, frequency, hematuria, pelvic pain, urgency, vaginal bleeding, vaginal discharge and vaginal pain.  Musculoskeletal: Negative for arthralgias, gait problem and joint swelling.  Skin: Negative for rash.  Neurological:  Positive for dizziness, weakness and light-headedness. Negative for syncope, speech difficulty, numbness and headaches.  Hematological: Negative for adenopathy.  Psychiatric/Behavioral: Negative for agitation, behavioral problems and dysphoric mood. The patient is not nervous/anxious.        Objective:   Physical Exam  Constitutional: She is oriented to person, place, and time. She appears well-developed and well-nourished. No distress.  Blood pressure 140/64  HENT:  Head: Normocephalic.  Right Ear: External ear normal.  Left Ear: External ear normal.  Mouth/Throat: Oropharynx is clear and moist.  Eyes: Conjunctivae and EOM are normal. Pupils are equal, round, and reactive to light.  Neck: Normal range of motion. Neck supple. No thyromegaly present.  Cardiovascular: Normal rate, normal heart sounds and intact distal pulses.   Irregular rhythm with controlled ventricular response;  rate mid sixties  Pulmonary/Chest: Effort normal and breath sounds normal.  Abdominal: Soft. Bowel sounds are normal. She exhibits no mass. There is no tenderness.  Musculoskeletal: Normal range of motion. She exhibits edema.  Pedal edema, left greater than right  Lymphadenopathy:    She has no cervical adenopathy.  Neurological: She is alert and oriented to person, place, and time.  Skin: Skin is warm and dry. No rash noted.  Psychiatric: She has a normal mood and affect. Her behavior is normal.          Assessment & Plan:   .  Dizziness/, possibly secondary to hypotension.  We'll decrease atenolol to 12 point 5 mg daily.  Otherwise no change in medical regimen may need to consider dose reduction of diuretic therapy.  If symptoms persist , chronic atrial fibrillation , essential hypertension , chronic venous insufficiency  Nyoka Cowden

## 2016-08-04 NOTE — Patient Instructions (Signed)
Decrease atenolol to 1 half tablet every day  Call or return to clinic prn if these symptoms worsen or fail to improve as anticipated.  Return in 3 months for follow-up

## 2016-08-04 NOTE — Progress Notes (Signed)
Pre visit review using our clinic review tool, if applicable. No additional management support is needed unless otherwise documented below in the visit note. 

## 2016-08-16 ENCOUNTER — Ambulatory Visit (INDEPENDENT_AMBULATORY_CARE_PROVIDER_SITE_OTHER): Payer: Medicare Other | Admitting: General Practice

## 2016-08-16 DIAGNOSIS — I4891 Unspecified atrial fibrillation: Secondary | ICD-10-CM

## 2016-08-16 DIAGNOSIS — Z5181 Encounter for therapeutic drug level monitoring: Secondary | ICD-10-CM

## 2016-08-16 LAB — POCT INR: INR: 2.8

## 2016-08-16 NOTE — Patient Instructions (Signed)
Pre visit review using our clinic review tool, if applicable. No additional management support is needed unless otherwise documented below in the visit note. 

## 2016-09-06 ENCOUNTER — Other Ambulatory Visit: Payer: Self-pay | Admitting: Internal Medicine

## 2016-09-06 DIAGNOSIS — Z1231 Encounter for screening mammogram for malignant neoplasm of breast: Secondary | ICD-10-CM

## 2016-09-10 DIAGNOSIS — Z1231 Encounter for screening mammogram for malignant neoplasm of breast: Secondary | ICD-10-CM | POA: Diagnosis not present

## 2016-09-15 ENCOUNTER — Ambulatory Visit: Payer: Medicare Other | Admitting: General Practice

## 2016-09-15 DIAGNOSIS — I4891 Unspecified atrial fibrillation: Secondary | ICD-10-CM

## 2016-09-15 DIAGNOSIS — Z5181 Encounter for therapeutic drug level monitoring: Secondary | ICD-10-CM

## 2016-09-15 LAB — POCT INR: INR: 2

## 2016-09-20 ENCOUNTER — Other Ambulatory Visit: Payer: Self-pay | Admitting: Internal Medicine

## 2016-09-22 DIAGNOSIS — Z0000 Encounter for general adult medical examination without abnormal findings: Secondary | ICD-10-CM | POA: Diagnosis not present

## 2016-09-29 DIAGNOSIS — K5732 Diverticulitis of large intestine without perforation or abscess without bleeding: Secondary | ICD-10-CM | POA: Diagnosis not present

## 2016-09-29 DIAGNOSIS — K625 Hemorrhage of anus and rectum: Secondary | ICD-10-CM | POA: Diagnosis not present

## 2016-10-13 ENCOUNTER — Ambulatory Visit: Payer: Medicare Other

## 2016-10-17 ENCOUNTER — Inpatient Hospital Stay (HOSPITAL_COMMUNITY): Payer: Medicare Other

## 2016-10-17 ENCOUNTER — Emergency Department (HOSPITAL_COMMUNITY): Payer: Medicare Other

## 2016-10-17 ENCOUNTER — Inpatient Hospital Stay (HOSPITAL_COMMUNITY)
Admission: EM | Admit: 2016-10-17 | Discharge: 2016-10-21 | DRG: 065 | Disposition: A | Discharge status: Skilled Nursing Facility | Payer: Medicare Other | Attending: Internal Medicine | Admitting: Internal Medicine

## 2016-10-17 ENCOUNTER — Encounter (HOSPITAL_COMMUNITY): Payer: Self-pay | Admitting: Radiology

## 2016-10-17 DIAGNOSIS — Z79899 Other long term (current) drug therapy: Secondary | ICD-10-CM

## 2016-10-17 DIAGNOSIS — I639 Cerebral infarction, unspecified: Secondary | ICD-10-CM | POA: Diagnosis present

## 2016-10-17 DIAGNOSIS — I4891 Unspecified atrial fibrillation: Secondary | ICD-10-CM | POA: Diagnosis not present

## 2016-10-17 DIAGNOSIS — Z96651 Presence of right artificial knee joint: Secondary | ICD-10-CM | POA: Diagnosis present

## 2016-10-17 DIAGNOSIS — E785 Hyperlipidemia, unspecified: Secondary | ICD-10-CM | POA: Diagnosis present

## 2016-10-17 DIAGNOSIS — I129 Hypertensive chronic kidney disease with stage 1 through stage 4 chronic kidney disease, or unspecified chronic kidney disease: Secondary | ICD-10-CM | POA: Diagnosis present

## 2016-10-17 DIAGNOSIS — I872 Venous insufficiency (chronic) (peripheral): Secondary | ICD-10-CM | POA: Diagnosis present

## 2016-10-17 DIAGNOSIS — H53461 Homonymous bilateral field defects, right side: Secondary | ICD-10-CM | POA: Diagnosis present

## 2016-10-17 DIAGNOSIS — R402242 Coma scale, best verbal response, confused conversation, at arrival to emergency department: Secondary | ICD-10-CM | POA: Diagnosis present

## 2016-10-17 DIAGNOSIS — Z66 Do not resuscitate: Secondary | ICD-10-CM | POA: Diagnosis present

## 2016-10-17 DIAGNOSIS — Z7901 Long term (current) use of anticoagulants: Secondary | ICD-10-CM | POA: Diagnosis not present

## 2016-10-17 DIAGNOSIS — R4701 Aphasia: Secondary | ICD-10-CM | POA: Diagnosis present

## 2016-10-17 DIAGNOSIS — Z882 Allergy status to sulfonamides status: Secondary | ICD-10-CM

## 2016-10-17 DIAGNOSIS — N183 Chronic kidney disease, stage 3 unspecified: Secondary | ICD-10-CM

## 2016-10-17 DIAGNOSIS — F418 Other specified anxiety disorders: Secondary | ICD-10-CM | POA: Diagnosis present

## 2016-10-17 DIAGNOSIS — I63412 Cerebral infarction due to embolism of left middle cerebral artery: Principal | ICD-10-CM | POA: Diagnosis present

## 2016-10-17 DIAGNOSIS — Z885 Allergy status to narcotic agent status: Secondary | ICD-10-CM

## 2016-10-17 DIAGNOSIS — I482 Chronic atrial fibrillation: Secondary | ICD-10-CM | POA: Diagnosis present

## 2016-10-17 DIAGNOSIS — N179 Acute kidney failure, unspecified: Secondary | ICD-10-CM | POA: Diagnosis present

## 2016-10-17 DIAGNOSIS — N390 Urinary tract infection, site not specified: Secondary | ICD-10-CM | POA: Diagnosis present

## 2016-10-17 DIAGNOSIS — Z90710 Acquired absence of both cervix and uterus: Secondary | ICD-10-CM

## 2016-10-17 DIAGNOSIS — I272 Pulmonary hypertension, unspecified: Secondary | ICD-10-CM | POA: Diagnosis present

## 2016-10-17 DIAGNOSIS — F341 Dysthymic disorder: Secondary | ICD-10-CM | POA: Diagnosis present

## 2016-10-17 DIAGNOSIS — Z823 Family history of stroke: Secondary | ICD-10-CM

## 2016-10-17 DIAGNOSIS — Z880 Allergy status to penicillin: Secondary | ICD-10-CM | POA: Diagnosis not present

## 2016-10-17 DIAGNOSIS — I6789 Other cerebrovascular disease: Secondary | ICD-10-CM | POA: Diagnosis not present

## 2016-10-17 DIAGNOSIS — R739 Hyperglycemia, unspecified: Secondary | ICD-10-CM | POA: Diagnosis present

## 2016-10-17 DIAGNOSIS — R402142 Coma scale, eyes open, spontaneous, at arrival to emergency department: Secondary | ICD-10-CM | POA: Diagnosis present

## 2016-10-17 DIAGNOSIS — H9190 Unspecified hearing loss, unspecified ear: Secondary | ICD-10-CM | POA: Diagnosis present

## 2016-10-17 DIAGNOSIS — R402362 Coma scale, best motor response, obeys commands, at arrival to emergency department: Secondary | ICD-10-CM | POA: Diagnosis present

## 2016-10-17 DIAGNOSIS — F419 Anxiety disorder, unspecified: Secondary | ICD-10-CM | POA: Diagnosis present

## 2016-10-17 DIAGNOSIS — I10 Essential (primary) hypertension: Secondary | ICD-10-CM

## 2016-10-17 DIAGNOSIS — F329 Major depressive disorder, single episode, unspecified: Secondary | ICD-10-CM | POA: Diagnosis present

## 2016-10-17 DIAGNOSIS — K219 Gastro-esophageal reflux disease without esophagitis: Secondary | ICD-10-CM | POA: Diagnosis present

## 2016-10-17 DIAGNOSIS — M1990 Unspecified osteoarthritis, unspecified site: Secondary | ICD-10-CM | POA: Diagnosis present

## 2016-10-17 DIAGNOSIS — R4189 Other symptoms and signs involving cognitive functions and awareness: Secondary | ICD-10-CM | POA: Diagnosis present

## 2016-10-17 DIAGNOSIS — R29705 NIHSS score 5: Secondary | ICD-10-CM | POA: Diagnosis present

## 2016-10-17 LAB — I-STAT CHEM 8, ED
BUN: 24 mg/dL — ABNORMAL HIGH (ref 6–20)
CREATININE: 1.4 mg/dL — AB (ref 0.44–1.00)
Calcium, Ion: 1.1 mmol/L — ABNORMAL LOW (ref 1.15–1.40)
Chloride: 100 mmol/L — ABNORMAL LOW (ref 101–111)
GLUCOSE: 137 mg/dL — AB (ref 65–99)
HEMATOCRIT: 36 % (ref 36.0–46.0)
HEMOGLOBIN: 12.2 g/dL (ref 12.0–15.0)
POTASSIUM: 4.2 mmol/L (ref 3.5–5.1)
Sodium: 144 mmol/L (ref 135–145)
TCO2: 34 mmol/L (ref 0–100)

## 2016-10-17 LAB — RAPID URINE DRUG SCREEN, HOSP PERFORMED
AMPHETAMINES: NOT DETECTED
BARBITURATES: NOT DETECTED
Benzodiazepines: NOT DETECTED
COCAINE: NOT DETECTED
OPIATES: NOT DETECTED
TETRAHYDROCANNABINOL: NOT DETECTED

## 2016-10-17 LAB — CBC WITH DIFFERENTIAL/PLATELET
BASOS ABS: 0.1 10*3/uL (ref 0.0–0.1)
BASOS PCT: 1 %
EOS PCT: 2 %
Eosinophils Absolute: 0.1 10*3/uL (ref 0.0–0.7)
HCT: 40.6 % (ref 36.0–46.0)
Hemoglobin: 13.7 g/dL (ref 12.0–15.0)
Lymphocytes Relative: 22 %
Lymphs Abs: 1.2 10*3/uL (ref 0.7–4.0)
MCH: 32.2 pg (ref 26.0–34.0)
MCHC: 33.7 g/dL (ref 30.0–36.0)
MCV: 95.3 fL (ref 78.0–100.0)
MONO ABS: 0.3 10*3/uL (ref 0.1–1.0)
Monocytes Relative: 6 %
Neutro Abs: 3.6 10*3/uL (ref 1.7–7.7)
Neutrophils Relative %: 69 %
PLATELETS: 191 10*3/uL (ref 150–400)
RBC: 4.26 MIL/uL (ref 3.87–5.11)
RDW: 14.6 % (ref 11.5–15.5)
WBC: 5.3 10*3/uL (ref 4.0–10.5)

## 2016-10-17 LAB — I-STAT TROPONIN, ED: Troponin i, poc: 0.01 ng/mL (ref 0.00–0.08)

## 2016-10-17 LAB — COMPREHENSIVE METABOLIC PANEL
ALK PHOS: 70 U/L (ref 38–126)
ALT: 25 U/L (ref 14–54)
ANION GAP: 8 (ref 5–15)
AST: 32 U/L (ref 15–41)
Albumin: 3.6 g/dL (ref 3.5–5.0)
BILIRUBIN TOTAL: 0.7 mg/dL (ref 0.3–1.2)
BUN: 20 mg/dL (ref 6–20)
CALCIUM: 9.1 mg/dL (ref 8.9–10.3)
CO2: 30 mmol/L (ref 22–32)
CREATININE: 1.23 mg/dL — AB (ref 0.44–1.00)
Chloride: 104 mmol/L (ref 101–111)
GFR calc non Af Amer: 37 mL/min — ABNORMAL LOW (ref >60)
GFR, EST AFRICAN AMERICAN: 43 mL/min — AB (ref >60)
GLUCOSE: 142 mg/dL — AB (ref 65–99)
Potassium: 4.1 mmol/L (ref 3.5–5.1)
Sodium: 142 mmol/L (ref 135–145)
TOTAL PROTEIN: 6.7 g/dL (ref 6.5–8.1)

## 2016-10-17 LAB — URINALYSIS, ROUTINE W REFLEX MICROSCOPIC
Bilirubin Urine: NEGATIVE
Glucose, UA: NEGATIVE mg/dL
HGB URINE DIPSTICK: NEGATIVE
Ketones, ur: NEGATIVE mg/dL
Nitrite: NEGATIVE
PROTEIN: NEGATIVE mg/dL
SPECIFIC GRAVITY, URINE: 1.01 (ref 1.005–1.030)
pH: 6 (ref 5.0–8.0)

## 2016-10-17 LAB — PROTIME-INR
INR: 2.26
Prothrombin Time: 25.3 seconds — ABNORMAL HIGH (ref 11.4–15.2)

## 2016-10-17 LAB — ETHANOL

## 2016-10-17 LAB — APTT: aPTT: 40 seconds — ABNORMAL HIGH (ref 24–36)

## 2016-10-17 MED ORDER — IOPAMIDOL (ISOVUE-370) INJECTION 76%
INTRAVENOUS | Status: AC
  Administered 2016-10-17: 50 mL
  Filled 2016-10-17: qty 100

## 2016-10-17 MED ORDER — SODIUM CHLORIDE 0.9 % IV SOLN
INTRAVENOUS | Status: DC
  Administered 2016-10-17 – 2016-10-18 (×2): via INTRAVENOUS

## 2016-10-17 MED ORDER — ASPIRIN EC 325 MG PO TBEC
325.0000 mg | DELAYED_RELEASE_TABLET | Freq: Every day | ORAL | Status: DC
  Administered 2016-10-17 – 2016-10-18 (×2): 325 mg via ORAL
  Filled 2016-10-17: qty 1

## 2016-10-17 MED ORDER — RIVAROXABAN 15 MG PO TABS: 15.0000 mg | ORAL_TABLET | Freq: Two times a day (BID) | ORAL | Status: DC

## 2016-10-17 MED ORDER — STROKE: EARLY STAGES OF RECOVERY BOOK: Freq: Once | Status: DC

## 2016-10-17 MED ORDER — OMEGA-3-ACID ETHYL ESTERS 1 G PO CAPS
1.0000 g | ORAL_CAPSULE | Freq: Every day | ORAL | Status: DC
  Administered 2016-10-18 – 2016-10-21 (×4): 1 g via ORAL
  Filled 2016-10-17 (×4): qty 1

## 2016-10-17 MED ORDER — ACETAMINOPHEN 325 MG PO TABS: 650.0000 mg | ORAL_TABLET | ORAL | Status: DC | PRN

## 2016-10-17 MED ORDER — ACETAMINOPHEN 160 MG/5ML PO SOLN: 650.0000 mg | ORAL | Status: DC | PRN

## 2016-10-17 MED ORDER — DOCUSATE SODIUM 100 MG PO CAPS
100.0000 mg | ORAL_CAPSULE | Freq: Every day | ORAL | Status: DC
Start: 2016-10-17 — End: 2016-10-21
  Administered 2016-10-18 – 2016-10-21 (×4): 100 mg via ORAL
  Filled 2016-10-17 (×4): qty 1

## 2016-10-17 MED ORDER — SODIUM CHLORIDE 0.9 % IV SOLN: INTRAVENOUS | Status: DC

## 2016-10-17 MED ORDER — PANTOPRAZOLE SODIUM 40 MG PO TBEC
40.0000 mg | DELAYED_RELEASE_TABLET | Freq: Every day | ORAL | Status: DC
  Administered 2016-10-18 – 2016-10-21 (×4): 40 mg via ORAL
  Filled 2016-10-17 (×4): qty 1

## 2016-10-17 MED ORDER — ACETAMINOPHEN 650 MG RE SUPP: 650.0000 mg | RECTAL | Status: DC | PRN

## 2016-10-17 NOTE — ED Notes (Signed)
EDP at bedside speaking with patient and son

## 2016-10-17 NOTE — ED Notes (Signed)
Pt to MRI

## 2016-10-17 NOTE — ED Triage Notes (Signed)
EMS called out for for being found by family slumped over in chair, on EMS arrival patient had slurred speech and was alert but not oriented. En route patient became more responsive and slurred speech has resolved. No neuro deficits found on exam by EMS. afib on monitor.

## 2016-10-17 NOTE — ED Notes (Signed)
Patient back from MRI. Patient placed on monitor and assisted to use bedside commode. Patient was able to tell me she needed to use restroom and assist with movements. She is still having trouble finding words at times. Patient does now have hearing aids in.

## 2016-10-17 NOTE — ED Notes (Signed)
RN ON 42M  CALLED TO TELL HIM DR Leonel Ramsay WANTED A STAT SCAN AND THE PT WOULD BE THERE WHENEVER SHE RETURNED TO THE ROOM

## 2016-10-17 NOTE — ED Provider Notes (Signed)
[facility] DEPT Provider Note   CSN: [id number] Arrival date & time: 10/17/16  1041     History   Chief Complaint Chief Complaint  Patient presents with  . Aphasia    LSN last night, aphasia has resolved  . Atrial Fibrillation  . Recurrent UTI    HPI DAWNETTE KROGH is a 83 y.o. female.  83 year old female with history of atrial fibrillation who presents with new onset expressive aphasia possible receptive aphasia. Last seen normal was yesterday. Was found today by family members in her chair not as responsive. No reported recent history of illnesses. Patient noted to have slurred speech as well as had trouble understanding her relatives. She is hard of hearing and doesn't have her hearing aids in. Is moving all 4 extremities appropriately. Did not have any facial droop. EMS called and patient transported here      Past Medical History:  Diagnosis Date  . ANXIETY DEPRESSION 12/11/2009  . Atrial fibrillation ([place]) 04/10/2007  . COLONIC POLYPS, HX OF 04/10/2007  . DISORDER, MENOPAUSAL NOS 05/15/2007  . DYSPNEA 02/11/2009  . Edema 03/13/2009  . HYPERTENSION 04/10/2007  . Osteoarth NOS-Unspec 04/10/2007  . VENOUS INSUFFICIENCY, CHRONIC 01/22/2010    Patient Active Problem List   Diagnosis Date Noted  . Permanent atrial fibrillation ([place]) 04/02/2016  . GERD (gastroesophageal reflux disease) 07/24/2014  . Encounter for therapeutic drug monitoring 01/24/2014  . Long term current use of anticoagulant 11/30/2010  . Venous (peripheral) insufficiency 01/22/2010  . ANXIETY DEPRESSION 12/11/2009  . EDEMA 03/13/2009  . Essential hypertension 04/10/2007  . Atrial fibrillation ([place]) 04/10/2007  . Osteoarthritis 04/10/2007  . COLONIC POLYPS, HX OF 04/10/2007    Past Surgical History:  Procedure Laterality Date  . ABDOMINAL HYSTERECTOMY  1959  . BLADDER SURGERY     tacking  . CARDIAC CATHETERIZATION    . CARPAL TUNNEL RELEASE    . CHOLECYSTECTOMY  1965  . HERNIA REPAIR    .  RECTOCELE REPAIR    . TOTAL KNEE ARTHROPLASTY  1993   right    OB History    No data available       Home Medications    Prior to Admission medications   Medication Sig Start Date End Date Taking? Authorizing Provider  acetaminophen (TYLENOL) 325 MG tablet Take 650 mg by mouth as needed.    Historical Provider, MD  atenolol (TENORMIN) 25 MG tablet Take 0.5 tablets (12.5 mg total) by mouth daily. 08/04/16   Marletta Lor, MD  b complex vitamins capsule Take 1 capsule by mouth daily.      Historical Provider, MD  diltiazem (CARDIZEM CD) 120 MG 24 hr capsule TAKE ONE CAPSULE BY MOUTH EVERY DAY 02/02/16   Marletta Lor, MD  docusate sodium (COLACE) 100 MG capsule Take 1 capsule (100 mg total) by mouth daily. 03/12/13   Josue Hector, MD  Flaxseed, Linseed, (BL FLAX SEED OIL) 1000 MG CAPS Take by mouth daily.      Historical Provider, MD  furosemide (LASIX) 40 MG tablet TAKE 1 TABLET (40 MG TOTAL) BY MOUTH 2 (TWO) TIMES DAILY. 09/20/16   Marletta Lor, MD  KLOR-CON M20 20 MEQ tablet TAKE 1 TABLET BY MOUTH TWICE A DAY 04/15/16   Marletta Lor, MD  Multiple Vitamins-Minerals (CENTRUM SILVER PO) Take by mouth daily.      Historical Provider, MD  Omega-3 Fatty Acids (FISH OIL) 1000 MG CAPS 2 TABS DAILY 03/12/13   Josue Hector, MD  omeprazole (PRILOSEC) 20 MG capsule TAKE 1 CAPSULE (20 MG TOTAL) BY MOUTH DAILY. 06/21/16   Marletta Lor, MD  warfarin (COUMADIN) 2 MG tablet TAKE AS DIRECTED BY ANTICOAGULATION CLINIC 02/02/16   Marletta Lor, MD    Family History Family History  Problem Relation Age of Onset  . CVA Father     Social History Social History  Substance Use Topics  . Smoking status: Never Smoker  . Smokeless tobacco: Never Used  . Alcohol use No     Allergies   Codeine; Nsaids; Penicillins; and Sulfonamide derivatives   Review of Systems Review of Systems  All other systems reviewed and are negative.    Physical Exam Updated Vital  Signs BP 161/85 (BP Location: Right Arm)   Pulse 67 Comment: afib  Temp 98.3 F (36.8 C) (Oral)   SpO2 100%   Physical Exam  Constitutional: She is oriented to person, place, and time. She appears well-developed and well-nourished.  Non-toxic appearance. No distress.  HENT:  Head: Normocephalic and atraumatic.  Eyes: Conjunctivae, EOM and lids are normal. Pupils are equal, round, and reactive to light.  Neck: Normal range of motion. Neck supple. No tracheal deviation present. No thyroid mass present.  Cardiovascular: Normal rate and normal heart sounds.  An irregularly irregular rhythm present. Exam reveals no gallop.   No murmur heard. Pulmonary/Chest: Effort normal and breath sounds normal. No stridor. No respiratory distress. She has no decreased breath sounds. She has no wheezes. She has no rhonchi. She has no rales.  Abdominal: Soft. Normal appearance and bowel sounds are normal. She exhibits no distension. There is no tenderness. There is no rebound and no CVA tenderness.  Musculoskeletal: Normal range of motion. She exhibits no edema or tenderness.  Neurological: She is alert and oriented to person, place, and time. She has normal strength. No cranial nerve deficit or sensory deficit. GCS eye subscore is 4. GCS verbal subscore is 5. GCS motor subscore is 6.  Patient has some receptive as well as excessive aphasia.  Skin: Skin is warm and dry. No abrasion and no rash noted.  Psychiatric: She has a normal mood and affect. Her speech is normal and behavior is normal.  Nursing note and vitals reviewed.    ED Treatments / Results  Labs (all labs ordered are listed, but only abnormal results are displayed) Labs Reviewed  PROTIME-INR  CBC WITH DIFFERENTIAL/PLATELET  COMPREHENSIVE METABOLIC PANEL  ETHANOL  PROTIME-INR  APTT  CBC  DIFFERENTIAL  COMPREHENSIVE METABOLIC PANEL  RAPID URINE DRUG SCREEN, HOSP PERFORMED  URINALYSIS, ROUTINE W REFLEX MICROSCOPIC  I-STAT CHEM 8, ED    I-STAT TROPOININ, ED    EKG  EKG Interpretation  Date/Time:  Sunday October 17 2016 11:20:11 EST Ventricular Rate:  78 PR Interval:    QRS Duration: 98 QT Interval:  410 QTC Calculation: 467 R Axis:   77 Text Interpretation:  Atrial fibrillation Minimal ST depression, inferior leads No significant change since last tracing Confirmed by Ahria Slappey  MD, Elgar Scoggins ([id number]) on 10/17/2016 11:40:54 AM       Radiology No results found.  Procedures Procedures (including critical care time)  Medications Ordered in ED Medications  0.9 %  sodium chloride infusion (not administered)     Initial Impression / Assessment and Plan / ED Course  I have reviewed the triage vital signs and the nursing notes.  Pertinent labs & imaging results that were available during my care of the patient were reviewed by me and considered  in my medical decision making (see chart for details).  Patient MRI results noted and positive for acute CVA. Her last seen normal cannot be confirmed and therefore she is not a TPA candidate. Will consult neurology and admitted to the medicine service    Final Clinical Impressions(s) / ED Diagnoses   Final diagnoses:  None    New Prescriptions New Prescriptions   No medications on file     Lacretia Leigh, MD 10/17/16 1340

## 2016-10-17 NOTE — ED Notes (Signed)
Report called to 5 m rn  Neuro check delayed dr Leonel Ramsay in to see

## 2016-10-17 NOTE — ED Notes (Signed)
Hard of hearing  She cannot remember her age but remINS ALERT

## 2016-10-17 NOTE — ED Notes (Signed)
To ct

## 2016-10-17 NOTE — H&P (Signed)
History and Physical    Bonnie Roberts [id number] DOB: [birth year] DOA: 10/17/2016   PCP: Nyoka Cowden, MD   Patient coming from/Resides with: Private residence/lives alone  Admission status: Inpatient/telemetry -medically necessary to stay a minimum 2 midnights to rule out impending and/or unexpected changes in physiologic status that may differ from initial evaluation performed in the ER and/or at time of admission. Patient presents with acute stroke symptoms with confirmed stroke on MRI. Patient will require telemetry monitoring, full stroke evaluation to include echocardiogram, additional diagnostic imaging, PT/OT/S up evaluation. Patient may require short-term rehabilitative stay post discharge from acute care.  Chief Complaint: Altered mentation, transient right-sided weakness, aphasia  HPI: Bonnie Roberts is a 83 y.o. female with medical history significant for atrial fibrillation recently transitioned from Coumadin to Xarelto, peripheral venous insufficiency on Lasix, hypertension, anxiety and depression, hard of hearing, osteoarthritis, GERD. Last seen normal unknown. Family found the patient slumped over in her chair at some point today patient at that time apparently was alert but not oriented and had slurred speech. Daughter also noted some right-sided weakness. Since that time patient has had persistent expressive aphasia. EMS was called to the home. Patient's symptoms apparently improved during transport to the hospital. Since last normal unknown EDP opted to not call a code stroke after presentation to the ER. CT head unremarkable but MRI brain without contrast did reveal moderate area of acute infarct in the left temporal occipital region with a possible thrombus in the insular M2 branch  ED Course:  Vital Signs: BP 155/61   Pulse 78   Temp 98 F (36.7 C)   Resp 16   SpO2 97%  CT head without contrast: Negative MRI brain without contrast: Acute infarct left  temporal occipital region with possible thrombus insular M2 branch Lab data: Sodium 142, potassium 4.1, CO2 30, glucose 142, BUN 20, creatinine 1.23, calcium 9.1, LFTs normal, poc troponin 0.01, white count 5300 with normal differential, hemoglobin 13.7, platelets 191,000, PT 25.3, INR 2.26, PTT 40, urinalysis abnormal with hazy appearance, rare bacteria, trace leukocytes, nitrite negative, WBC 0-5, urine drug screen negative Medications and treatments: None  Review of Systems:  In addition to the HPI above,  No Fever-chills, myalgias or other constitutional symptoms No Headache, changes with Vision or hearing, new weakness, tingling, numbness in any extremity, dizziness, gait disturbance or imbalance, tremors or seizure activity No problems swallowing food or Liquids, indigestion/reflux, choking or coughing while eating, abdominal pain with or after eating No Chest pain, Cough or Shortness of Breath, palpitations, orthopnea or DOE No Abdominal pain, N/V, melena,hematochezia, dark tarry stools, constipation No dysuria, malodorous urine, hematuria or flank pain No new skin rashes, lesions, masses or bruises, No new joint pains, aches, swelling or redness No recent unintentional weight gain or loss No polyuria, polydypsia or polyphagia   Past Medical History:  Diagnosis Date  . ANXIETY DEPRESSION 12/11/2009  . Atrial fibrillation ([place]) 04/10/2007  . COLONIC POLYPS, HX OF 04/10/2007  . DISORDER, MENOPAUSAL NOS 05/15/2007  . DYSPNEA 02/11/2009  . Edema 03/13/2009  . HYPERTENSION 04/10/2007  . Osteoarth NOS-Unspec 04/10/2007  . VENOUS INSUFFICIENCY, CHRONIC 01/22/2010    Past Surgical History:  Procedure Laterality Date  . ABDOMINAL HYSTERECTOMY  1959  . BLADDER SURGERY     tacking  . CARDIAC CATHETERIZATION    . CARPAL TUNNEL RELEASE    . CHOLECYSTECTOMY  1965  . HERNIA REPAIR    . RECTOCELE REPAIR    . TOTAL KNEE ARTHROPLASTY  1993   right    Social History   Social History  . Marital  status: Widowed    Spouse name: N/A  . Number of children: 4  . Years of education: N/A   Occupational History  . RETIRED Retired   Social History Main Topics  . Smoking status: Never Smoker  . Smokeless tobacco: Never Used  . Alcohol use No  . Drug use: No  . Sexual activity: Not on file   Other Topics Concern  . Not on file   Social History Narrative  . No narrative on file    Mobility: Occasionally utilizes cane Work history: Not obtained   Allergies  Allergen Reactions  . Codeine Nausea And Vomiting  . Nsaids Other (See Comments)    GI issues  . Penicillins Itching and Rash  . Sulfonamide Derivatives Itching and Rash    Family History  Problem Relation Age of Onset  . CVA Father      Prior to Admission medications   Medication Sig Start Date End Date Taking? Authorizing Provider  atenolol (TENORMIN) 25 MG tablet Take 0.5 tablets (12.5 mg total) by mouth daily. 08/04/16  Yes Marletta Lor, MD  diltiazem (CARDIZEM CD) 120 MG 24 hr capsule TAKE ONE CAPSULE BY MOUTH EVERY DAY 02/02/16  Yes Marletta Lor, MD  docusate sodium (COLACE) 100 MG capsule Take 1 capsule (100 mg total) by mouth daily. 03/12/13  Yes Josue Hector, MD  furosemide (LASIX) 40 MG tablet TAKE 1 TABLET (40 MG TOTAL) BY MOUTH 2 (TWO) TIMES DAILY. 09/20/16  Yes Marletta Lor, MD  KLOR-CON M20 20 MEQ tablet TAKE 1 TABLET BY MOUTH TWICE A DAY 04/15/16  Yes Marletta Lor, MD  omeprazole (PRILOSEC) 20 MG capsule TAKE 1 CAPSULE (20 MG TOTAL) BY MOUTH DAILY. 06/21/16  Yes Marletta Lor, MD  potassium chloride SA (K-DUR,KLOR-CON) 20 MEQ tablet Take 20 mEq by mouth daily.   Yes Historical Provider, MD  Rivaroxaban (XARELTO) 15 MG TABS tablet Take 15 mg by mouth 2 (two) times daily with a meal.   Yes Historical Provider, MD  acetaminophen (TYLENOL) 325 MG tablet Take 650 mg by mouth as needed.    Historical Provider, MD  b complex vitamins capsule Take 1 capsule by mouth daily.       Historical Provider, MD  Flaxseed, Linseed, (BL FLAX SEED OIL) 1000 MG CAPS Take by mouth daily.      Historical Provider, MD  Multiple Vitamins-Minerals (CENTRUM SILVER PO) Take by mouth daily.      Historical Provider, MD  Omega-3 Fatty Acids (FISH OIL) 1000 MG CAPS 2 TABS DAILY 03/12/13   Josue Hector, MD  warfarin (COUMADIN) 2 MG tablet TAKE AS DIRECTED BY ANTICOAGULATION CLINIC 02/02/16   Marletta Lor, MD    Physical Exam: Vitals:   10/17/16 1108 10/17/16 1325 10/17/16 1400 10/17/16 1436  BP: 161/85 158/84 155/61   Pulse: 67 71 78   Resp:  16 16   Temp: 98.3 F (36.8 C)   98 F (36.7 C)  TempSrc: Oral     SpO2: 100% 100% 97%       Constitutional: NAD, calm, comfortable Eyes: PERRL, lids and conjunctivae normalExcept for some very mild ectropion bilaterally ENMT: Mucous membranes are moist. Posterior pharynx clear of any exudate or lesions.poor dentition.  Neck: normal, supple, no masses, no thyromegaly Respiratory: clear to auscultation bilaterally, no wheezing, no crackles. Normal respiratory effort. No accessory muscle use.  Cardiovascular:  Regular rate and rhythm, no murmurs / rubs / gallops. No extremity edema. 2+ pedal pulses. No carotid bruits.  Abdomen: no tenderness, no masses palpated. No hepatosplenomegaly. Bowel sounds positive.  Musculoskeletal: no clubbing / cyanosis. No joint deformity upper and lower extremities. Good ROM, no contractures. Normal muscle tone.  Skin: no rashes, lesions, ulcers. No induration Neurologic: CN 2-12 grossly intact supper previously documented significant hearing loss. Sensation intact, DTR normal. Strength 5/5 x all 4 extremities. May have some mild expressive aphasia but this is difficult to ascertain given patient's hearing loss. She appeared to have appropriate responses to questions asked when I weaned in and spoke into her left ear (location of her hearing aid) Psychiatric: Alert and oriented x name and place. Normal mood.  Again difficult to ascertain correctly given the degree of her hearing loss-son states patient does have some baseline short-term memory deficits   Labs on Admission: I have personally reviewed following labs and imaging studies  CBC:  Recent Labs Lab 10/17/16 1134 10/17/16 1143  WBC 5.3  --   NEUTROABS 3.6  --   HGB 13.7 12.2  HCT 40.6 36.0  MCV 95.3  --   PLT 191  --    Basic Metabolic Panel:  Recent Labs Lab 10/17/16 1134 10/17/16 1143  NA 142 144  K 4.1 4.2  CL 104 100*  CO2 30  --   GLUCOSE 142* 137*  BUN 20 24*  CREATININE 1.23* 1.40*  CALCIUM 9.1  --    GFR: CrCl cannot be calculated (Unknown ideal weight.). Liver Function Tests:  Recent Labs Lab 10/17/16 1134  AST 32  ALT 25  ALKPHOS 70  BILITOT 0.7  PROT 6.7  ALBUMIN 3.6   No results for input(s): LIPASE, AMYLASE in the last 168 hours. No results for input(s): AMMONIA in the last 168 hours. Coagulation Profile:  Recent Labs Lab 10/17/16 1134  INR 2.26   Cardiac Enzymes: No results for input(s): CKTOTAL, CKMB, CKMBINDEX, TROPONINI in the last 168 hours. BNP (last 3 results) No results for input(s): PROBNP in the last 8760 hours. HbA1C: No results for input(s): HGBA1C in the last 72 hours. CBG: No results for input(s): GLUCAP in the last 168 hours. Lipid Profile: No results for input(s): CHOL, HDL, LDLCALC, TRIG, CHOLHDL, LDLDIRECT in the last 72 hours. Thyroid Function Tests: No results for input(s): TSH, T4TOTAL, FREET4, T3FREE, THYROIDAB in the last 72 hours. Anemia Panel: No results for input(s): VITAMINB12, FOLATE, FERRITIN, TIBC, IRON, RETICCTPCT in the last 72 hours. Urine analysis:    Component Value Date/Time   COLORURINE YELLOW 10/17/2016 1220   APPEARANCEUR HAZY (A) 10/17/2016 1220   LABSPEC 1.010 10/17/2016 1220   PHURINE 6.0 10/17/2016 1220   GLUCOSEU NEGATIVE 10/17/2016 1220   HGBUR NEGATIVE 10/17/2016 1220   BILIRUBINUR NEGATIVE 10/17/2016 1220   BILIRUBINUR n  10/29/2014 1408   KETONESUR NEGATIVE 10/17/2016 1220   PROTEINUR NEGATIVE 10/17/2016 1220   UROBILINOGEN 1.0 10/29/2014 1408   UROBILINOGEN 1.0 01/30/2011 1533   NITRITE NEGATIVE 10/17/2016 1220   LEUKOCYTESUR TRACE (A) 10/17/2016 1220   Sepsis Labs: @LABRCNTIP (procalcitonin:4,lacticidven:4) )No results found for this or any previous visit (from the past 240 hour(s)).   Radiological Exams on Admission: Ct Head Wo Contrast  Result Date: 10/17/2016 CLINICAL DATA:  Altered mental status, right-sided weakness, slurred speech EXAM: CT HEAD WITHOUT CONTRAST TECHNIQUE: Contiguous axial images were obtained from the base of the skull through the vertex without intravenous contrast. COMPARISON:  None available FINDINGS: Brain: Mild  age related brain atrophy and chronic white matter microvascular changes about the ventricles. No acute intracranial hemorrhage, mass lesion, definite infarction, midline shift, herniation, hydrocephalus, or extra-axial fluid collection. Normal gray-white matter differentiation. No focal mass effect or edema. Cisterns are patent. Cerebellar atrophy as well. Vascular: Atherosclerotic vascular changes noted. Skull: Hyperostosis frontalis interna evident. No acute osseous finding or fracture. Mastoids and sinuses remain clear. Sinuses/Orbits: No acute finding. Other: None. IMPRESSION: Age related brain atrophy and minor white matter microvascular ischemic changes. No acute intracranial abnormality by noncontrast CT. Intracranial atherosclerosis Electronically Signed   By: Jerilynn Mages.  Shick M.D.   On: 10/17/2016 11:52   Mr Brain Wo Contrast  Result Date: 10/17/2016 CLINICAL DATA:  Right-sided weakness and slurred speech. Trouble finding words. EXAM: MRI HEAD WITHOUT CONTRAST TECHNIQUE: Multiplanar, multiecho pulse sequences of the brain and surrounding structures were obtained without intravenous contrast. COMPARISON:  Head CT from earlier today FINDINGS: Brain: Moderate area of cortical and  white matter restricted diffusion in the posterior and superior left temporal lobe and lateral left occipital lobe. This is in the MCA distribution in there is T1 and FLAIR hyperintensity within left MCA branches seen at the level of the infarct and proximally to a M2 insular branch. No acute hemorrhage. Susceptibility artifact along the left sylvian fissure but no visible hematoma on the other sequences. Mild for age microvascular ischemic gliosis in the cerebral white matter. Probable inconsequential hygroma around the left cerebellum. No hydrocephalus or mass. Vascular: Slow flow/ thrombus in left MCA branches as described above. Skull and upper cervical spine: Negative Sinuses/Orbits: Bilateral cataract resection. IMPRESSION: Moderate area of acute infarct in the left temporal occipital region. Associated MCA branches show slow flow or thrombus to the level of an insular M2 branch. Electronically Signed   By: Monte Fantasia M.D.   On: 10/17/2016 13:24    EKG: (Independently reviewed) atrophic fibrillation with ventricular rate 70 bpm, QTC 467 ms, nonspecific ST changes in inferolateral leads unchanged from previous  Assessment/Plan Principal Problem:   Acute CVA (cerebrovascular accident)  -Patient presents with initial altered mentation with reported right-sided weakness, and slurred speech (resolved by presentation to ER, with persistent aphasia also seems to be improving -MRI reveals acute infarct left temporal occipital area and possible slow flow versus thrombus to the insular M2 branch -Patient anticoagulated prior to admission with recent transition from Coumadin to warfarin and INR greater than 2.0 -Discussed with neurology who will see patient -Obtain MRA head and carotid duplex -Risk factor stratification with hemoglobin A1c and lipid panel -PT/OT/SLP evaluation -Frequent neurological checks -NPO until passes bedside swallow evaluation  **After discussing with neurology he is opted  to take patient for CT perfusion scan with CTA of head and neck and has canceled MRA-I will cancel carotid duplex in the holding orders   Active Problems:   Acute kidney injury  -Baseline: 0.98 -Current: 1.23 -Gentle IV fluid hydration -Follow electrolytes    Essential hypertension -Current blood pressure controlled -Allow for permissive hypertension -Hold preadmission Lasix, Cardizem and Tenormin    Atrial fibrillation  -Currently rate controlled-preadmission beta blocker and calcium channel blocker on hold -Continue Xarelto -CHADVASc=4    ANXIETY DEPRESSION -Not on medications prior to admission    Venous (peripheral) insufficiency -Suspect rationale for Lasix since previous echo in 2001 with normal EF with normal ventricular wall size and only moderate TR and mild pulmonary hypertension -Currently no edema on clinical exam    Acute hyperglycemia -Follow up on hemoglobin A1c  DVT prophylaxis: Xarelto Code Status: DO NOT RESUSCITATE  Family Communication: Son at bedside Disposition Plan: Undetermined at time of admission-patient will need to undergo PT/OT evaluation in the event she would require rehabilitative therapies prior to returning home Consults called: Neurology/[place]    ELLIS,ALLISON L. ANP-BC [facility] Pager [phone]   If 7PM-7AM, please contact night-coverage [email or web address] Password [email or web address]  10/17/2016, 2:56 PM

## 2016-10-17 NOTE — Progress Notes (Signed)
Final result swallow screen was not completed in the Ed. Completed on unit.  Pt passed. Will inquire whether patient may have a diet or not.

## 2016-10-17 NOTE — Consult Note (Signed)
Neurology Consultation Reason for Consult: Aphasia Referring Physician: Arlyn Leak  CC: aphasia  History is obtained from:family  HPI: Bonnie Roberts is a 83 y.o. 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 She is very independent at baseline, continues to drive, continues to do all the activities required for her and her daughter.  Today, it is unclear exactly when she changed but at 10 AM the daughter noticed that something was not right because she was not responding to her and she went across the street to get help from the patient's sister-in-law. Initially, she was completely "unresponsive" which I take to mean that she was not talking, and she was transported to the emergency department.   LKW: Unclear tpa given?: no, unclear time of onset    ROS:   Unable to obtain due to altered mental status.   Past Medical History:  Diagnosis Date  . ANXIETY DEPRESSION 12/11/2009  . Atrial fibrillation ([place]) 04/10/2007  . COLONIC POLYPS, HX OF 04/10/2007  . DISORDER, MENOPAUSAL NOS 05/15/2007  . DYSPNEA 02/11/2009  . Edema 03/13/2009  . HYPERTENSION 04/10/2007  . Osteoarth NOS-Unspec 04/10/2007  . VENOUS INSUFFICIENCY, CHRONIC 01/22/2010     Family History  Problem Relation Age of Onset  . CVA Father      Social History:  reports that she has never smoked. She has never used smokeless tobacco. She reports that she does not drink alcohol or use drugs.   Exam: Current vital signs: BP (!) 156/62 (BP Location: Right Arm)   Pulse 84   Temp 98.5 F (36.9 C)   Resp 18   SpO2 97%  Vital signs in last 24 hours: Temp:  [98 F (36.7 C)-98.5 F (36.9 C)] 98.5 F (36.9 C) (01/07 1708) Pulse Rate:  [67-84] 84 (01/07 1708) Resp:  [16-18] 18 (01/07 1708) BP: (155-161)/(61-85) 156/62 (01/07 1708) SpO2:  [97 %-100 %] 97 % (01/07 1708)   Physical Exam  Constitutional: Appears well-developed and  well-nourished.  Psych: Affect appropriate to situation Eyes: No scleral injection HENT: No OP obstrucion Head: Normocephalic.  Cardiovascular: Normal rate and regular rhythm.  Respiratory: Effort normal and breath sounds normal to anterior ascultation GI: Soft.  No distension. There is no tenderness.  Skin: WDI  Neuro: Mental Status: Patient is awake, alert, She has a moderate to severe aphasia, but is able to answer some questions fairly clearly, at other times having difficulty following even the simplest of commands. Cranial Nerves: II: She has a right hemianopia Pupils are equal, round, and reactive to light.   III,IV, VI: EOMI without ptosis or diploplia.  V: Facial sensation is symmetric to temperature VII: Facial movement is symmetric.  VIII: hearing is intact to voice X: Uvula elevates symmetrically XI: Shoulder shrug is symmetric. XII: tongue is midline without atrophy or fasciculations.  Motor: Tone is normal. Bulk is normal. Though she has good apparent strength, she does not move her right arm is much as her left. Sensory: She endorses symmetric sensation, but I am not certain if she is understanding well. Cerebellar: She does not perform, but no clear ataxia when reaching for things.   I have reviewed labs in epic and the results pertinent to this consultation are: Cr 1.2  I have reviewed the images obtained: MRI brain-left parietal region infarct.  CT perfusion demonstrates an M3 embolus which appears to be penumbra based on the CT perfusion,  but a significant portion this artery shows diffusion change on MRI.  Impression: 83 year old female with suspected embolic infarct. I do wonder if she was taking her xarelto with a meal given a report of decreased appetite. She is being admitted for further workup and therapy.  Recommendations: 1. HgbA1c, fasting lipid panel 2. Frequent neuro checks 3. Echocardiogram 5. Prophylactic therapy-Antiplatelet med: Aspirin -  dose 325mg  PO or 300mg  PR 6. Risk factor modification 7. Telemetry monitoring 8. PT consult, OT consult, Speech consult 9. please page stroke NP  Or  PA  Or MD  from 8am -4 pm starting 1/8 as this patient will be followed by the stroke team at this point.   You can look them up on [email or web address]      Roland Rack, MD [facility] [phone]  If 7pm- 7am, please page neurology on call as listed in [place].

## 2016-10-17 NOTE — ED Notes (Addendum)
Family to bedside to help with history. Patient and daughter live together, daughter states that she found patient with right sided weakness slurred speech and trouble finding words. 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 Patient is hard of hearing as well and does not have hearing aids in. On arrival patient was able to assist me in getting her clothes off, lifting hips, moving arms around. Patient is unable to follow all commands at this time. Patient is still having trouble finding words but no aphasia noted. No facial droop or weakness to extremities. Recent change from warfarin to xarelto.

## 2016-10-17 NOTE — ED Notes (Signed)
Patient assisted to use bedpan, tolerated well.

## 2016-10-18 DIAGNOSIS — I4891 Unspecified atrial fibrillation: Secondary | ICD-10-CM

## 2016-10-18 DIAGNOSIS — N183 Chronic kidney disease, stage 3 unspecified: Secondary | ICD-10-CM

## 2016-10-18 DIAGNOSIS — I639 Cerebral infarction, unspecified: Secondary | ICD-10-CM

## 2016-10-18 DIAGNOSIS — N179 Acute kidney failure, unspecified: Secondary | ICD-10-CM

## 2016-10-18 DIAGNOSIS — I10 Essential (primary) hypertension: Secondary | ICD-10-CM

## 2016-10-18 LAB — LIPID PANEL
Cholesterol: 171 mg/dL (ref 0–200)
HDL: 45 mg/dL (ref >40)
LDL CALC: 116 mg/dL — AB (ref 0–99)
TRIGLYCERIDES: 49 mg/dL (ref <150)
Total CHOL/HDL Ratio: 3.8 RATIO
VLDL: 10 mg/dL (ref 0–40)

## 2016-10-18 MED ORDER — ATORVASTATIN CALCIUM 40 MG PO TABS
40.0000 mg | ORAL_TABLET | Freq: Every day | ORAL | Status: DC
  Administered 2016-10-18 – 2016-10-20 (×3): 40 mg via ORAL
  Filled 2016-10-18 (×3): qty 1

## 2016-10-18 MED ORDER — RIVAROXABAN 15 MG PO TABS
15.0000 mg | ORAL_TABLET | Freq: Every day | ORAL | Status: DC
  Administered 2016-10-18 – 2016-10-20 (×3): 15 mg via ORAL
  Filled 2016-10-18 (×3): qty 1

## 2016-10-18 MED ORDER — ATORVASTATIN CALCIUM 10 MG PO TABS: 10.0000 mg | ORAL_TABLET | Freq: Every day | ORAL | Status: DC

## 2016-10-18 NOTE — Progress Notes (Signed)
STROKE TEAM PROGRESS NOTE   HISTORY OF PRESENT ILLNESS (per record) Bonnie Roberts is a 83 y.o. 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 She is very independent at baseline, continues to drive, continues to do all the activities required for her and her daughter.  Today, it is unclear exactly when she changed but at 10 AM the daughter noticed that something was not right because she was not responding to her and she went across the street to get help from the patient's sister-in-law. Initially, she was completely "unresponsive" which I take to mean that she was not talking, and she was transported to the emergency department.  Patient was not administered IV t-PA secondary to unknown LKW. She was admitted for further evaluation and treatment.   SUBJECTIVE (INTERVAL HISTORY) Her family is at the bedside. She is sitting up in the chair. She is hard of hearing  Overall she feels her condition is unchanged.    OBJECTIVE Temp:  [98 F (36.7 C)-99.8 F (37.7 C)] 99.2 F (37.3 C) (01/08 0419) Pulse Rate:  [59-84] 69 (01/08 0419) Cardiac Rhythm: Atrial fibrillation (01/08 0703) Resp:  [16-18] 16 (01/08 0419) BP: (123-158)/(51-84) 125/58 (01/08 0419) SpO2:  [93 %-100 %] 93 % (01/08 0419)  CBC:   Recent Labs Lab 10/17/16 1134 10/17/16 1143  WBC 5.3  --   NEUTROABS 3.6  --   HGB 13.7 12.2  HCT 40.6 36.0  MCV 95.3  --   PLT 191  --     Basic Metabolic Panel:   Recent Labs Lab 10/17/16 1134 10/17/16 1143  NA 142 144  K 4.1 4.2  CL 104 100*  CO2 30  --   GLUCOSE 142* 137*  BUN 20 24*  CREATININE 1.23* 1.40*  CALCIUM 9.1  --     Lipid Panel:     Component Value Date/Time   CHOL 171 10/18/2016 0435   TRIG 49 10/18/2016 0435   HDL 45 10/18/2016 0435   CHOLHDL 3.8 10/18/2016 0435   VLDL 10 10/18/2016 0435   LDLCALC 116 (H) 10/18/2016 0435   HgbA1c:  Lab Results   Component Value Date   HGBA1C (H) 07/13/2010    6.0 (NOTE)                                                                       According to the ADA Clinical Practice Recommendations for 2011, when HbA1c is used as a screening test:   >=6.5%   Diagnostic of Diabetes Mellitus           (if abnormal result  is confirmed)  5.7-6.4%   Increased risk of developing Diabetes Mellitus  References:Diagnosis and Classification of Diabetes Mellitus,Diabetes [id number] 1):S62-S69 and Standards of Medical Care in         Diabetes - 2011,Diabetes Care,2011,34  (Suppl 1):S11-S61.   Urine Drug Screen:     Component Value Date/Time   LABOPIA NONE DETECTED 10/17/2016 1220   COCAINSCRNUR NONE DETECTED 10/17/2016 1220   LABBENZ NONE DETECTED 10/17/2016 1220   AMPHETMU NONE DETECTED 10/17/2016 1220   THCU NONE DETECTED 10/17/2016 1220   LABBARB NONE DETECTED 10/17/2016  [place] Contrast 10/17/2016 Age related brain atrophy and minor white matter microvascular ischemic changes. No acute intracranial abnormality by noncontrast CT. Intracranial atherosclerosis   Ct Angio Head W Or Wo Contrast Ct Angio Neck W Or Wo Contrast 10/17/2016 Left M3 branch occlusion corresponding to the acute left parietal infarct. Probable embolus. Otherwise no significant intracranial stenosis No significant carotid or vertebral artery stenosis in the neck.   Ct Cerebral Perfusion W Contrast 10/17/2016 Restricted diffusion in the left parietal lobe compatible with infarct. This area shows increased T-max of 26 mL. No reduction in cerebral blood flow in this area. Possible reperfusion of infarction.   Mr Brain Wo Contrast 10/17/2016 Moderate area of acute infarct in the left temporal occipital region. Associated MCA branches show slow flow or thrombus to the level of an insular M2 branch.    PHYSICAL EXAM Pleasant elderly Caucasian lady not in distress. . Afebrile. Head is nontraumatic. Neck is  supple without bruit.    Cardiac exam no murmur or gallop. Lungs are clear to auscultation. Distal pulses are well felt. Neurological Exam :  Awake alert oriented 2. Dementia tension, registration and recall. Follows only simple one-step commands. No dysarthria but slightly nonfluent speech. Right homonymous hemianopsia and will not blink to threat on the right but will do so on the left. Mild right lower facial is symmetric. Tongue midline. Motor system exam she can move all 4 extremities well against gravity but does not move the right hand is as much. Appears to withdraw to painful stimuli in all 4 extremities well. Deep tendon reflexes are symmetric. Plantars downgoing. Gait was not tested. ASSESSMENT/PLAN Ms. BRENDOLYN BIRTS is a 83 y.o. female with history of atrial fibrillation recently transitioned from Coumadin to Xarelto, peripheral venous insufficiency on Lasix, hypertension, anxiety and depression, hard of hearing, osteoarthritis, GERD presenting with aphasia. She did not receive IV t-PA due to unclear time of onset.   Stroke:   left temporal occipital infarct embolic secondary to known atrial fibrillation  Resultant  right homonymous hemianopsia  CT head/neck L M3 occlusion  CT perfuision restricted diffusion compatible with infarct  MRI  L temporal occipital infarct. Slow flow L MCA M2 branches  2D Echo  pending   LDL 116  HgbA1c pending  SCDs for VTE prophylaxis Diet Heart Room service appropriate? Yes; Fluid consistency: Thin  Xarelto (rivaroxaban) daily prior to admission, now on aspirin 325 mg daily . Safe to go ahead and resume xarelto. Still stop aspirin  Patient counseled to be compliant with her antithrombotic medications  Ongoing aggressive stroke risk factor management  Therapy recommendations:  CIR. Consulted.  Disposition:  pending  Atrial Fibrillation  Home anticoagulation:  Xarelto (rivaroxaban) daily  She had a few missed doses of xarelto, likely  leading to the stroke  Resume xarelto. No indication to change agents   Hypertension  Stable  Permissive hypertension (OK if < 220/120) but gradually normalize in 5-7 days  Long-term BP goal normotensive  Hyperlipidemia  Home meds:  No statin, on fish oil which was resumed in hospital  LDL 116, goal < 70  Add low dose stain  Continue statin at discharge  Other Stroke Risk Factors  Advanced age  Family hx stroke (father)  Other Active Problems  AKI  Anxiety/depression  Venous insufficiency  Acute hyperglycemia  Hospital day # 1  BIBY,SHARON  [place] for Pager information 10/18/2016 1:03 PM  I  have personally examined this patient, reviewed notes, independently viewed imaging studies, participated in medical decision making and plan of care.ROS completed by me personally and pertinent positives fully documented  I have made any additions or clarifications directly to the above note. Agree with note above. . I had a long discussion with the patient and multiple family members at the bedside and answered questions. I suspect the patient was not compliant with Xarelto and may have missed a few doses which resulted in her embolic stroke from atrial fibrillation. The patient likely needs 24-hour supervision and medications to be dispensed under supervision in the future and family understands this. Continue Xarelto. Greater than 50% time during this 35 minute visit was spent on counseling and coordination of care about stroke risk atrial fibrillation and stroke prevention  Antony Contras, MD Medical Director [facility] Pager: [phone] 10/18/2016 4:13 PM  To contact Stroke Continuity provider, please refer to [email or web address] After hours, contact General Neurology

## 2016-10-18 NOTE — Evaluation (Signed)
Physical Therapy Evaluation Patient Details Name: Bonnie Roberts MRN: [id number] DOB: March 16, 1922 Today's Date: 10/18/2016   History of Present Illness  Bonnie Roberts is a 83 y.o. female with a Past Medical History significant for  Afib, PVD, HTN, anxiety who presents with transients right-sided weakness. Patient with left-sided stroke on MRI (L parietal infarct).   Clinical Impression  Patient presents with decreased independence with mobility due to deficits listed in PT problem list.  She will benefit from skilled PT in the acute setting to maximize mobility and independence in prep for CIR level rehab prior to d/c home.  Language deficits noted with attempts at reading menu (but did not have her reading glasses either).  Unsure as well if has stroke related visual symptoms limiting this task.  Will follow up.     Follow Up Recommendations CIR    Equipment Recommendations  Other (comment) (TBA)    Recommendations for Other Services Rehab consult     Precautions / Restrictions Precautions Precautions: Fall      Mobility  Bed Mobility Overal bed mobility: Needs Assistance Bed Mobility: Supine to Sit     Supine to sit: Supervision;HOB elevated     General bed mobility comments: increased time, assist for safety due to IV, telemetry and assist for fixing socks for safety  Transfers Overall transfer level: Needs assistance Equipment used: Rolling walker (2 wheeled) Transfers: Sit to/from Stand Sit to Stand: Min assist         General transfer comment: assist for balance, safety  Ambulation/Gait Ambulation/Gait assistance: Min assist Ambulation Distance (Feet): 10 Feet (x 2) Assistive device: Rolling walker (2 wheeled);1 person hand held assist Gait Pattern/deviations: Step-through pattern;Decreased stride length;Shuffle     General Gait Details: used walker to walk to bathroom, then HHA to return to recliner, pt needing mod cues for safety/positioning, assist for  balance  Stairs            Wheelchair Mobility    Modified Rankin (Stroke Patients Only) Modified Rankin (Stroke Patients Only) Pre-Morbid Rankin Score: No significant disability Modified Rankin: Moderately severe disability     Balance Overall balance assessment: Needs assistance   Sitting balance-Leahy Scale: Good     Standing balance support: Single extremity supported;During functional activity Standing balance-Leahy Scale: Poor Standing balance comment: performed toilet hygiene with supervision with one UE supported                             Pertinent Vitals/Pain Pain Assessment: No/denies pain    Home Living Family/patient expects to be discharged to:: Private residence Living Arrangements: Children Available Help at Discharge: Family;Available 24 hours/day Type of Home: House           Additional Comments: possibly would be going to daughter's home in [place]    Prior Function Level of Independence: Independent with assistive device(s)         Comments: used cane PTA     Hand Dominance        Extremity/Trunk Assessment   Upper Extremity Assessment Upper Extremity Assessment: Defer to OT evaluation    Lower Extremity Assessment Lower Extremity Assessment: Overall WFL for tasks assessed       Communication   Communication: Expressive difficulties;Receptive difficulties;HOH  Cognition Arousal/Alertness: Awake/alert Behavior During Therapy: WFL for tasks assessed/performed Overall Cognitive Status: Impaired/Different from baseline Area of Impairment: Following commands;Orientation;Safety/judgement;Problem solving Orientation Level: Disoriented to;Time     Following Commands: Follows one step  commands with increased time;Follows one step commands consistently Safety/Judgement: Decreased awareness of deficits;Decreased awareness of safety     General Comments: difficulty to determine receptively what is due to  hearing impairment, versus language impairment    General Comments General comments (skin integrity, edema, etc.): family in room; son and daughter in law (who was in w/c with casted ankle from fx)  State pt will have assist at d/c.    Exercises     Assessment/Plan    PT Assessment Patient needs continued PT services  PT Problem List Decreased mobility;Decreased safety awareness;Decreased balance;Decreased cognition;Decreased knowledge of use of DME;Decreased activity tolerance          PT Treatment Interventions DME instruction;Gait training;Functional mobility training;Stair training;Balance training;Therapeutic exercise;Therapeutic activities;Patient/family education;Cognitive remediation    PT Goals (Current goals can be found in the Care Plan section)  Acute Rehab PT Goals Patient Stated Goal: To go to rehab then home with assist PT Goal Formulation: With patient/family Time For Goal Achievement: 10/25/16 Potential to Achieve Goals: Good    Frequency Min 4X/week   Barriers to discharge        Co-evaluation               End of Session Equipment Utilized During Treatment: Gait belt Activity Tolerance: Patient tolerated treatment well Patient left: in chair;with call bell/phone within reach;with chair alarm set           Time: [id number] PT Time Calculation (min) (ACUTE ONLY): 31 min   Charges:   PT Evaluation $PT Eval Moderate Complexity: 1 Procedure PT Treatments $Gait Training: 8-22 mins   PT G CodesReginia Naas November 06, 2016, 9:36 AM  Magda Kiel, [place] 11/06/2016

## 2016-10-18 NOTE — Consult Note (Signed)
Physical Medicine and Rehabilitation Consult Reason for Consult: Acute infarct left temporal occipital region Referring Physician: [facility]   HPI: Bonnie Roberts is a 83 y.o. right handed female with history of hypertension, atrial fibrillation recently transitioned from Coumadin to [place]. Per chart review, patient's daughter in law, patient's son/daughter in law, patient lives with her 2 year old daughter who is mentally challenged. Patient independent with a cane prior to admission driving short distances. She plans on returning home with son and daughter in law. She has family in the area with good support. Presented 10/17/2016 with right-sided weakness and aphasia. Cranial CT scan negative. Patient did not receive TPA. MRI/CT cerebral perfusion showed moderate area of acute infarct left temporal occipital region. CT angiogram head and neck showed left M3 branch occlusion. No significant carotid or vertebral artery stenosis in the neck. Echocardiogram pending. Currently on aspirin for CVA prophylaxis. Physical therapy evaluation completed 10/18/2016 with recommendations of physical medicine rehabilitation consult.   Review of Systems  Constitutional: Negative for chills and fever.  HENT: Positive for hearing loss.   Eyes: Negative for blurred vision and double vision.  Respiratory: Negative for cough and shortness of breath.   Cardiovascular: Positive for palpitations and leg swelling. Negative for chest pain.  Gastrointestinal: Positive for constipation. Negative for nausea and vomiting.  Genitourinary: Positive for urgency. Negative for dysuria and hematuria.  Musculoskeletal: Positive for joint pain and myalgias.  Neurological: Positive for speech change and weakness. Negative for seizures.  Psychiatric/Behavioral: Positive for depression and memory loss.       Anxiety  All other systems reviewed and are negative.  Past Medical History:  Diagnosis Date  . ANXIETY DEPRESSION  12/11/2009  . Atrial fibrillation ([place]) 04/10/2007  . COLONIC POLYPS, HX OF 04/10/2007  . DISORDER, MENOPAUSAL NOS 05/15/2007  . DYSPNEA 02/11/2009  . Edema 03/13/2009  . HYPERTENSION 04/10/2007  . Osteoarth NOS-Unspec 04/10/2007  . VENOUS INSUFFICIENCY, CHRONIC 01/22/2010   Past Surgical History:  Procedure Laterality Date  . ABDOMINAL HYSTERECTOMY  1959  . BLADDER SURGERY     tacking  . CARDIAC CATHETERIZATION    . CARPAL TUNNEL RELEASE    . CHOLECYSTECTOMY  1965  . HERNIA REPAIR    . RECTOCELE REPAIR    . TOTAL KNEE ARTHROPLASTY  1993   right   Family History  Problem Relation Age of Onset  . CVA Father    Social History:  reports that she has never smoked. She has never used smokeless tobacco. She reports that she does not drink alcohol or use drugs. Allergies:  Allergies  Allergen Reactions  . Codeine Nausea And Vomiting  . Nsaids Other (See Comments)    GI issues  . Penicillins Itching and Rash  . Sulfonamide Derivatives Itching and Rash   Medications Prior to Admission  Medication Sig Dispense Refill  . acetaminophen (TYLENOL) 325 MG tablet Take 650 mg by mouth as needed for moderate pain.     Marland Kitchen atenolol (TENORMIN) 25 MG tablet Take 0.5 tablets (12.5 mg total) by mouth daily. 90 tablet 1  . b complex vitamins capsule Take 1 capsule by mouth daily.      Marland Kitchen diltiazem (CARDIZEM CD) 120 MG 24 hr capsule TAKE ONE CAPSULE BY MOUTH EVERY DAY 90 capsule 2  . docusate sodium (COLACE) 100 MG capsule Take 1 capsule (100 mg total) by mouth daily. (Patient taking differently: Take 100 mg by mouth every evening. ) 10 capsule   . Flaxseed, Linseed, (BL FLAX  SEED OIL) 1000 MG CAPS Take by mouth daily.      . furosemide (LASIX) 40 MG tablet TAKE 1 TABLET (40 MG TOTAL) BY MOUTH 2 (TWO) TIMES DAILY. 180 tablet 3  . KLOR-CON M20 20 MEQ tablet TAKE 1 TABLET BY MOUTH TWICE A DAY 180 tablet 1  . Multiple Vitamins-Minerals (CENTRUM SILVER PO) Take by mouth daily.      . Omega-3 Fatty Acids (FISH  OIL) 1000 MG CAPS 2 TABS DAILY    . omeprazole (PRILOSEC) 20 MG capsule TAKE 1 CAPSULE (20 MG TOTAL) BY MOUTH DAILY. 30 capsule 11  . Rivaroxaban (XARELTO) 15 MG TABS tablet Take 15 mg by mouth 2 (two) times daily with a meal.      Home: Home Living Family/patient expects to be discharged to:: Private residence Living Arrangements: Children Available Help at Discharge: Family, Available 24 hours/day Type of Home: House Additional Comments: possibly would be going to daughter's home in [place]  Functional History: Prior Function Level of Independence: Independent with assistive device(s) Comments: used cane PTA Functional Status:  Mobility: Bed Mobility Overal bed mobility: Needs Assistance Bed Mobility: Supine to Sit Supine to sit: Supervision, HOB elevated General bed mobility comments: increased time, assist for safety due to IV, telemetry and assist for fixing socks for safety Transfers Overall transfer level: Needs assistance Equipment used: Rolling walker (2 wheeled) Transfers: Sit to/from Stand Sit to Stand: Min assist General transfer comment: assist for balance, safety Ambulation/Gait Ambulation/Gait assistance: Min assist Ambulation Distance (Feet): 10 Feet (x 2) Assistive device: Rolling walker (2 wheeled), 1 person hand held assist Gait Pattern/deviations: Step-through pattern, Decreased stride length, Shuffle General Gait Details: used walker to walk to bathroom, then HHA to return to recliner, pt needing mod cues for safety/positioning, assist for balance    ADL:    Cognition: Cognition Overall Cognitive Status: Impaired/Different from baseline Orientation Level: Oriented X4, Oriented to person Cognition Arousal/Alertness: Awake/alert Behavior During Therapy: WFL for tasks assessed/performed Overall Cognitive Status: Impaired/Different from baseline Area of Impairment: Following commands, Orientation, Safety/judgement, Problem solving Orientation  Level: Disoriented to, Time Following Commands: Follows one step commands with increased time, Follows one step commands consistently Safety/Judgement: Decreased awareness of deficits, Decreased awareness of safety General Comments: difficulty to determine receptively what is due to hearing impairment, versus language impairment  Blood pressure (!) 125/58, pulse 69, temperature 99.2 F (37.3 C), temperature source Oral, resp. rate 16, SpO2 93 %. Physical Exam  Vitals reviewed. Constitutional: She appears well-developed and well-nourished.  HENT:  Head: Normocephalic and atraumatic.  Eyes: Conjunctivae and EOM are normal.  Neck: Normal range of motion. Neck supple. No thyromegaly present.  Cardiovascular:  Irregular irregular  Respiratory: Effort normal and breath sounds normal.  GI: Soft. Bowel sounds are normal. She exhibits no distension.  Musculoskeletal: She exhibits edema. She exhibits no tenderness.  Neurological: She is alert.  Patient is extremely hard of hearing.  She was able to follow basic demonstrated commands. She had difficulty with providing her age and present year but was able to give her date of birth. Sensation intact to light touch DTRs symmetric Motor: 4/5 throughout (?right slightly weaker than left)  Skin: Skin is warm and dry.  Psychiatric:  Mood and behavior appear normal    Results for orders placed or performed during the hospital encounter of 10/17/16 (from the past 24 hour(s))  CBC with Differential/Platelet     Status: None   Collection Time: 10/17/16 11:34 AM  Result Value Ref Range  WBC 5.3 4.0 - 10.5 K/uL   RBC 4.26 3.87 - 5.11 MIL/uL   Hemoglobin 13.7 12.0 - 15.0 g/dL   HCT 40.6 36.0 - 46.0 %   MCV 95.3 78.0 - 100.0 fL   MCH 32.2 26.0 - 34.0 pg   MCHC 33.7 30.0 - 36.0 g/dL   RDW 14.6 11.5 - 15.5 %   Platelets 191 150 - 400 K/uL   Neutrophils Relative % 69 %   Neutro Abs 3.6 1.7 - 7.7 K/uL   Lymphocytes Relative 22 %   Lymphs Abs 1.2  0.7 - 4.0 K/uL   Monocytes Relative 6 %   Monocytes Absolute 0.3 0.1 - 1.0 K/uL   Eosinophils Relative 2 %   Eosinophils Absolute 0.1 0.0 - 0.7 K/uL   Basophils Relative 1 %   Basophils Absolute 0.1 0.0 - 0.1 K/uL  Comprehensive metabolic panel     Status: Abnormal   Collection Time: 10/17/16 11:34 AM  Result Value Ref Range   Sodium 142 135 - 145 mmol/L   Potassium 4.1 3.5 - 5.1 mmol/L   Chloride 104 101 - 111 mmol/L   CO2 30 22 - 32 mmol/L   Glucose, Bld 142 (H) 65 - 99 mg/dL   BUN 20 6 - 20 mg/dL   Creatinine, Ser 1.23 (H) 0.44 - 1.00 mg/dL   Calcium 9.1 8.9 - 10.3 mg/dL   Total Protein 6.7 6.5 - 8.1 g/dL   Albumin 3.6 3.5 - 5.0 g/dL   AST 32 15 - 41 U/L   ALT 25 14 - 54 U/L   Alkaline Phosphatase 70 38 - 126 U/L   Total Bilirubin 0.7 0.3 - 1.2 mg/dL   GFR calc non Af Amer 37 (L) >60 mL/min   GFR calc Af Amer 43 (L) >60 mL/min   Anion gap 8 5 - 15  Protime-INR     Status: Abnormal   Collection Time: 10/17/16 11:34 AM  Result Value Ref Range   Prothrombin Time 25.3 (H) 11.4 - 15.2 seconds   INR 2.26   APTT     Status: Abnormal   Collection Time: 10/17/16 11:34 AM  Result Value Ref Range   aPTT 40 (H) 24 - 36 seconds  I-stat troponin, ED (not at [facility], [facility])     Status: None   Collection Time: 10/17/16 11:40 AM  Result Value Ref Range   Troponin i, poc 0.01 0.00 - 0.08 ng/mL   Comment 3          I-Stat Chem 8, ED  (not at [facility], [facility])     Status: Abnormal   Collection Time: 10/17/16 11:43 AM  Result Value Ref Range   Sodium 144 135 - 145 mmol/L   Potassium 4.2 3.5 - 5.1 mmol/L   Chloride 100 (L) 101 - 111 mmol/L   BUN 24 (H) 6 - 20 mg/dL   Creatinine, Ser 1.40 (H) 0.44 - 1.00 mg/dL   Glucose, Bld 137 (H) 65 - 99 mg/dL   Calcium, Ion 1.10 (L) 1.15 - 1.40 mmol/L   TCO2 34 0 - 100 mmol/L   Hemoglobin 12.2 12.0 - 15.0 g/dL   HCT 36.0 36.0 - 46.0 %  Urine rapid drug screen (hosp performed)not at [facility]     Status: None   Collection Time: 10/17/16 12:20 PM  Result Value  Ref Range   Opiates NONE DETECTED NONE DETECTED   Cocaine NONE DETECTED NONE DETECTED   Benzodiazepines NONE DETECTED NONE DETECTED   Amphetamines NONE DETECTED NONE  DETECTED   Tetrahydrocannabinol NONE DETECTED NONE DETECTED   Barbiturates NONE DETECTED NONE DETECTED  Urinalysis, Routine w reflex microscopic     Status: Abnormal   Collection Time: 10/17/16 12:20 PM  Result Value Ref Range   Color, Urine YELLOW YELLOW   APPearance HAZY (A) CLEAR   Specific Gravity, Urine 1.010 1.005 - 1.030   pH 6.0 5.0 - 8.0   Glucose, UA NEGATIVE NEGATIVE mg/dL   Hgb urine dipstick NEGATIVE NEGATIVE   Bilirubin Urine NEGATIVE NEGATIVE   Ketones, ur NEGATIVE NEGATIVE mg/dL   Protein, ur NEGATIVE NEGATIVE mg/dL   Nitrite NEGATIVE NEGATIVE   Leukocytes, UA TRACE (A) NEGATIVE   RBC / HPF 0-5 0 - 5 RBC/hpf   WBC, UA 0-5 0 - 5 WBC/hpf   Bacteria, UA RARE (A) NONE SEEN   Squamous Epithelial / LPF 0-5 (A) NONE SEEN   Mucous PRESENT    Hyaline Casts, UA PRESENT   Ethanol     Status: None   Collection Time: 10/17/16  4:58 PM  Result Value Ref Range   Alcohol, Ethyl (B) <5 <5 mg/dL  Lipid panel     Status: Abnormal   Collection Time: 10/18/16  4:35 AM  Result Value Ref Range   Cholesterol 171 0 - 200 mg/dL   Triglycerides 49 <150 mg/dL   HDL 45 >40 mg/dL   Total CHOL/HDL Ratio 3.8 RATIO   VLDL 10 0 - 40 mg/dL   LDL Cholesterol 116 (H) 0 - 99 mg/dL   Ct Angio Head W Or Wo Contrast  Result Date: 10/17/2016 CLINICAL DATA:  Stroke.  Atrial fibrillation. EXAM: CT ANGIOGRAPHY HEAD AND NECK TECHNIQUE: Multidetector CT imaging of the head and neck was performed using the standard protocol during bolus administration of intravenous contrast. Multiplanar CT image reconstructions and MIPs were obtained to evaluate the vascular anatomy. Carotid stenosis measurements (when applicable) are obtained utilizing NASCET criteria, using the distal internal carotid diameter as the denominator. CONTRAST:  50 mL Isovue 370  IV COMPARISON:  MRI head 10/17/2016 FINDINGS: CTA NECK FINDINGS Aortic arch: Atherosclerotic calcification in the aortic arch without aneurysm. Proximal great vessels widely patent Right carotid system: Right common carotid artery widely patent. Mild atherosclerotic calcification right carotid bifurcation without significant stenosis. Left carotid system: Mild atherosclerotic calcification left common carotid artery. Minimal atherosclerotic disease at the carotid bifurcation without stenosis. Vertebral arteries: Both vertebral arteries widely patent in the neck. Skeleton: Cervical disc and facet degeneration. Negative for fracture. No acute skeletal abnormality. Other neck: Negative Upper chest: Negative Review of the MIP images confirms the above findings CTA HEAD FINDINGS Anterior circulation: Mild atherosclerotic disease in the right cavernous carotid without stenosis. Right anterior and middle cerebral arteries widely patent without stenosis Mild atherosclerotic calcification left cavernous carotid without stenosis. Left anterior cerebral artery widely patent. Left M1 widely patent. Branch occlusion of the left M3 segment supplying the left parietal lobe corresponding to acute infarct. Remainder of the left middle cerebral artery branches are normal. Posterior circulation: Both vertebral arteries widely patent without stenosis. PICA patent bilaterally. Basilar widely patent. AICA, superior cerebellar, and posterior cerebral arteries normal Venous sinuses: Negative Anatomic variants: None Delayed phase: Not performed. Review of the MIP images confirms the above findings IMPRESSION: Left M3 branch occlusion corresponding to the acute left parietal infarct. Probable embolus. Otherwise no significant intracranial stenosis No significant carotid or vertebral artery stenosis in the neck. Electronically Signed   By: Franchot Gallo M.D.   On: 10/17/2016 17:09  Ct Head Wo Contrast  Result Date: 10/17/2016 CLINICAL  DATA:  Altered mental status, right-sided weakness, slurred speech EXAM: CT HEAD WITHOUT CONTRAST TECHNIQUE: Contiguous axial images were obtained from the base of the skull through the vertex without intravenous contrast. COMPARISON:  None available FINDINGS: Brain: Mild age related brain atrophy and chronic white matter microvascular changes about the ventricles. No acute intracranial hemorrhage, mass lesion, definite infarction, midline shift, herniation, hydrocephalus, or extra-axial fluid collection. Normal gray-white matter differentiation. No focal mass effect or edema. Cisterns are patent. Cerebellar atrophy as well. Vascular: Atherosclerotic vascular changes noted. Skull: Hyperostosis frontalis interna evident. No acute osseous finding or fracture. Mastoids and sinuses remain clear. Sinuses/Orbits: No acute finding. Other: None. IMPRESSION: Age related brain atrophy and minor white matter microvascular ischemic changes. No acute intracranial abnormality by noncontrast CT. Intracranial atherosclerosis Electronically Signed   By: Jerilynn Mages.  Shick M.D.   On: 10/17/2016 11:52   Ct Angio Neck W Or Wo Contrast  Result Date: 10/17/2016 CLINICAL DATA:  Stroke.  Atrial fibrillation. EXAM: CT ANGIOGRAPHY HEAD AND NECK TECHNIQUE: Multidetector CT imaging of the head and neck was performed using the standard protocol during bolus administration of intravenous contrast. Multiplanar CT image reconstructions and MIPs were obtained to evaluate the vascular anatomy. Carotid stenosis measurements (when applicable) are obtained utilizing NASCET criteria, using the distal internal carotid diameter as the denominator. CONTRAST:  50 mL Isovue 370 IV COMPARISON:  MRI head 10/17/2016 FINDINGS: CTA NECK FINDINGS Aortic arch: Atherosclerotic calcification in the aortic arch without aneurysm. Proximal great vessels widely patent Right carotid system: Right common carotid artery widely patent. Mild atherosclerotic calcification right  carotid bifurcation without significant stenosis. Left carotid system: Mild atherosclerotic calcification left common carotid artery. Minimal atherosclerotic disease at the carotid bifurcation without stenosis. Vertebral arteries: Both vertebral arteries widely patent in the neck. Skeleton: Cervical disc and facet degeneration. Negative for fracture. No acute skeletal abnormality. Other neck: Negative Upper chest: Negative Review of the MIP images confirms the above findings CTA HEAD FINDINGS Anterior circulation: Mild atherosclerotic disease in the right cavernous carotid without stenosis. Right anterior and middle cerebral arteries widely patent without stenosis Mild atherosclerotic calcification left cavernous carotid without stenosis. Left anterior cerebral artery widely patent. Left M1 widely patent. Branch occlusion of the left M3 segment supplying the left parietal lobe corresponding to acute infarct. Remainder of the left middle cerebral artery branches are normal. Posterior circulation: Both vertebral arteries widely patent without stenosis. PICA patent bilaterally. Basilar widely patent. AICA, superior cerebellar, and posterior cerebral arteries normal Venous sinuses: Negative Anatomic variants: None Delayed phase: Not performed. Review of the MIP images confirms the above findings IMPRESSION: Left M3 branch occlusion corresponding to the acute left parietal infarct. Probable embolus. Otherwise no significant intracranial stenosis No significant carotid or vertebral artery stenosis in the neck. Electronically Signed   By: Franchot Gallo M.D.   On: 10/17/2016 17:09   Mr Brain Wo Contrast  Result Date: 10/17/2016 CLINICAL DATA:  Right-sided weakness and slurred speech. Trouble finding words. EXAM: MRI HEAD WITHOUT CONTRAST TECHNIQUE: Multiplanar, multiecho pulse sequences of the brain and surrounding structures were obtained without intravenous contrast. COMPARISON:  Head CT from earlier today FINDINGS:  Brain: Moderate area of cortical and white matter restricted diffusion in the posterior and superior left temporal lobe and lateral left occipital lobe. This is in the MCA distribution in there is T1 and FLAIR hyperintensity within left MCA branches seen at the level of the infarct and proximally to a M2  insular branch. No acute hemorrhage. Susceptibility artifact along the left sylvian fissure but no visible hematoma on the other sequences. Mild for age microvascular ischemic gliosis in the cerebral white matter. Probable inconsequential hygroma around the left cerebellum. No hydrocephalus or mass. Vascular: Slow flow/ thrombus in left MCA branches as described above. Skull and upper cervical spine: Negative Sinuses/Orbits: Bilateral cataract resection. IMPRESSION: Moderate area of acute infarct in the left temporal occipital region. Associated MCA branches show slow flow or thrombus to the level of an insular M2 branch. Electronically Signed   By: Monte Fantasia M.D.   On: 10/17/2016 13:24   Ct Cerebral Perfusion W Contrast  Result Date: 10/17/2016 CLINICAL DATA:  Stroke. Left parietal infarct on MRI. Atrial fibrillation. EXAM: CT PERFUSION BRAIN TECHNIQUE: Multiphase CT imaging of the brain was performed following IV bolus contrast injection. Subsequent parametric perfusion maps were calculated using RAPID software. CONTRAST:  40 mL Isovue 370 IV COMPARISON:  MRI head CT 10/17/2016 FINDINGS: CT Brain Perfusion Findings: CBF (<30%) Volume: 39mL Perfusion (Tmax>6.0s) volume: 66mL Mismatch Volume: 65mL Infarction Location:Increased T-max in the left parietal lobe corresponding to the area of restricted diffusion on MRI. This area does not show any decrease in cerebral blood flow. IMPRESSION: Restricted diffusion in the left parietal lobe compatible with infarct. This area shows increased T-max of 26 mL. No reduction in cerebral blood flow in this area. Possible reperfusion of infarction. Electronically Signed    By: Franchot Gallo M.D.   On: 10/17/2016 16:59    Assessment/Plan: Diagnosis: Acute infarct left temporal occipital region Labs and images independently reviewed.  Records reviewed and summated above. Stroke: Continue secondary stroke prophylaxis and Risk Factor Modification listed below:   Antiplatelet therapy:   Blood Pressure Management:  Continue current medication with prn's with permisive HTN per primary team Statin Agent:   Preiabetes management:   Right sided hemiparesis Motor recovery: Fluoxetine  1. Does the need for close, 24 hr/day medical supervision in concert with the patient's rehab needs make it unreasonable for this patient to be served in a less intensive setting? Yes  2. Co-Morbidities requiring supervision/potential complications: HTN (monitor and provide prns in accordance with increased physical exertion and pain), atrial fibrillation (monitor HR with increased physical activity), AKI on CKD (avoid nephrotoxic meds) 3. Due to safety, disease management, medication administration and patient education, does the patient require 24 hr/day rehab nursing? Yes 4. Does the patient require coordinated care of a physician, rehab nurse, PT (1-2 hrs/day, 5 days/week), OT (1-2 hrs/day, 5 days/week) and SLP (1-2 hrs/day, 5 days/week) to address physical and functional deficits in the context of the above medical diagnosis(es)? Yes Addressing deficits in the following areas: balance, endurance, locomotion, strength, transferring, bathing, dressing, toileting, cognition, speech and psychosocial support 5. Can the patient actively participate in an intensive therapy program of at least 3 hrs of therapy per day at least 5 days per week? Yes 6. The potential for patient to make measurable gains while on inpatient rehab is excellent 7. Anticipated functional outcomes upon discharge from inpatient rehab are modified independent  with PT, modified independent and supervision with OT, modified  independent and supervision with SLP. 8. Estimated rehab length of stay to reach the above functional goals is: 14-18 days. 9. Does the patient have adequate social supports and living environment to accommodate these discharge functional goals? Yes 10. Anticipated D/C setting: Home 11. Anticipated post D/C treatments: HH therapy and Home excercise program 12. Overall Rehab/Functional Prognosis: good  RECOMMENDATIONS:  This patient's condition is appropriate for continued rehabilitative care in the following setting: CIR after completion of medical workup  Patient has agreed to participate in recommended program. Yes Note that insurance prior authorization may be required for reimbursement for recommended care.  Comment: Rehab Admissions Coordinator to follow up.  Delice Lesch, MD, Mellody Drown Cathlyn Parsons., PA-C 10/18/2016

## 2016-10-18 NOTE — Progress Notes (Signed)
Rehab Admissions Coordinator Note:  Patient was screened by Retta Diones for appropriateness for an Inpatient Acute Rehab Consult.  At this time, we are recommending Inpatient Rehab consult.  Retta Diones 10/18/2016, 10:21 AM  I can be reached at [phone].

## 2016-10-18 NOTE — Progress Notes (Signed)
PROGRESS NOTE    Bonnie Roberts  [id number] DOB: October 18, 1921 DOA: 10/17/2016 PCP: Nyoka Cowden, MD    Brief Narrative:   83 y/o ? Known history chronic atrial fibrillation on Coumadin in the past--transitioned from coumadin recently to [place] 08/21/2012 low risk nuclear study Prior heart cath 2006 Prior colonoscopy 05/2004 Degenerative joint disease HTN Chronic venous insufficiency  Admitted with altered state last known normal prior to admission the night before Very still drives, lives with her mentally handicapped 74 year old daughter and is fully functional  On admission apparently was unresponsive TPA was not given and it was unable to obtain review of systems due to altered state   Assessment & Plan:   Principal Problem:   Acute CVA (cerebrovascular accident) [facility]) Active Problems:   ANXIETY DEPRESSION   Essential hypertension   Atrial fibrillation ([place])   Venous (peripheral) insufficiency   Acute kidney injury ([place])   Acute hyperglycemia  Left temporal occipital Acute CVA patient's main cognitive deficit currently is word salad and she has difficulty with executive higher level to start motion such as finger-nose-finger test Agreed patient may benefit from CIR placement and consult has been placed Please see neurology progess note for work up to date Anticoagulant Xarelto, discontinue aspirin + increased statin to 40 mg high intensity dosing Continue IV saline 75 cc/H SLP cog and speech eval pending  HTN allow slightly permissive hypertension in setting of acute CVA  Atrial fibrillation, [place] score 4-5 Rate controlled without medication. Usually on Atenolol 12.5 daily/Cardizem 120 XL Holding lasix 40 bid  GERd Cont Protonix  Venous insuff Stable currently      DVT prophylaxis: anticoagulated-Xatrelto Code Status: DO NOT RESUSCITATE  Family Communication: Discussed with son, relatives in room Disposition Plan:  inpatient   Consultants:   Neuro  Procedures:   ECho pedning  Antimicrobials:    nad   Subjective:  Alert pleasant oriented in nad Eating and drinking Word salad noted  Objective: Vitals:   10/17/16 1708 10/17/16 2055 10/18/16 0159 10/18/16 0419  BP: (!) 156/62 (!) 142/51 123/75 (!) 125/58  Pulse: 84 (!) 59 62 69  Resp: 18 16 16 16   Temp: 98.5 F (36.9 C) 98.5 F (36.9 C) 99.8 F (37.7 C) 99.2 F (37.3 C)  TempSrc:  Oral Oral Oral  SpO2: 97% 96% 99% 93%   No intake or output data in the 24 hours ending 10/18/16 0803 There were no vitals filed for this visit.  Examination:  General exam: calm and comfortable  Respiratory system: Clear to auscultation. Respiratory effort normal. Cardiovascular system: S1 & S2 heard, IRREG IRREG Gastrointestinal system: Abdomen is nondistended, soft and nontender.  Central nervous system: Alert and oriented. Finger nose finger unable to perform, Word salad Extremities: Symmetric 5 x 5 power. Skin: No rashes, lesions or ulcers Psychiatry: Judgement and insight appear normal. Mood & affect appropriate.     Data Reviewed: I have personally reviewed following labs and imaging studies  CBC:  Recent Labs Lab 10/17/16 1134 10/17/16 1143  WBC 5.3  --   NEUTROABS 3.6  --   HGB 13.7 12.2  HCT 40.6 36.0  MCV 95.3  --   PLT 191  --    Basic Metabolic Panel:  Recent Labs Lab 10/17/16 1134 10/17/16 1143  NA 142 144  K 4.1 4.2  CL 104 100*  CO2 30  --   GLUCOSE 142* 137*  BUN 20 24*  CREATININE 1.23* 1.40*  CALCIUM 9.1  --  GFR: CrCl cannot be calculated (Unknown ideal weight.). Liver Function Tests:  Recent Labs Lab 10/17/16 1134  AST 32  ALT 25  ALKPHOS 70  BILITOT 0.7  PROT 6.7  ALBUMIN 3.6   No results for input(s): LIPASE, AMYLASE in the last 168 hours. No results for input(s): AMMONIA in the last 168 hours. Coagulation Profile:  Recent Labs Lab 10/17/16 1134  INR 2.26   Cardiac Enzymes: No  results for input(s): CKTOTAL, CKMB, CKMBINDEX, TROPONINI in the last 168 hours. BNP (last 3 results) No results for input(s): PROBNP in the last 8760 hours. HbA1C: No results for input(s): HGBA1C in the last 72 hours. CBG: No results for input(s): GLUCAP in the last 168 hours. Lipid Profile:  Recent Labs  10/18/16 0435  CHOL 171  HDL 45  LDLCALC 116*  TRIG 49  CHOLHDL 3.8   Thyroid Function Tests: No results for input(s): TSH, T4TOTAL, FREET4, T3FREE, THYROIDAB in the last 72 hours. Anemia Panel: No results for input(s): VITAMINB12, FOLATE, FERRITIN, TIBC, IRON, RETICCTPCT in the last 72 hours. Sepsis Labs: No results for input(s): PROCALCITON, LATICACIDVEN in the last 168 hours.  No results found for this or any previous visit (from the past 240 hour(s)).       Radiology Studies: Ct Angio Head W Or Wo Contrast  Result Date: 10/17/2016 CLINICAL DATA:  Stroke.  Atrial fibrillation. EXAM: CT ANGIOGRAPHY HEAD AND NECK TECHNIQUE: Multidetector CT imaging of the head and neck was performed using the standard protocol during bolus administration of intravenous contrast. Multiplanar CT image reconstructions and MIPs were obtained to evaluate the vascular anatomy. Carotid stenosis measurements (when applicable) are obtained utilizing NASCET criteria, using the distal internal carotid diameter as the denominator. CONTRAST:  50 mL Isovue 370 IV COMPARISON:  MRI head 10/17/2016 FINDINGS: CTA NECK FINDINGS Aortic arch: Atherosclerotic calcification in the aortic arch without aneurysm. Proximal great vessels widely patent Right carotid system: Right common carotid artery widely patent. Mild atherosclerotic calcification right carotid bifurcation without significant stenosis. Left carotid system: Mild atherosclerotic calcification left common carotid artery. Minimal atherosclerotic disease at the carotid bifurcation without stenosis. Vertebral arteries: Both vertebral arteries widely patent in  the neck. Skeleton: Cervical disc and facet degeneration. Negative for fracture. No acute skeletal abnormality. Other neck: Negative Upper chest: Negative Review of the MIP images confirms the above findings CTA HEAD FINDINGS Anterior circulation: Mild atherosclerotic disease in the right cavernous carotid without stenosis. Right anterior and middle cerebral arteries widely patent without stenosis Mild atherosclerotic calcification left cavernous carotid without stenosis. Left anterior cerebral artery widely patent. Left M1 widely patent. Branch occlusion of the left M3 segment supplying the left parietal lobe corresponding to acute infarct. Remainder of the left middle cerebral artery branches are normal. Posterior circulation: Both vertebral arteries widely patent without stenosis. PICA patent bilaterally. Basilar widely patent. AICA, superior cerebellar, and posterior cerebral arteries normal Venous sinuses: Negative Anatomic variants: None Delayed phase: Not performed. Review of the MIP images confirms the above findings IMPRESSION: Left M3 branch occlusion corresponding to the acute left parietal infarct. Probable embolus. Otherwise no significant intracranial stenosis No significant carotid or vertebral artery stenosis in the neck. Electronically Signed   By: Franchot Gallo M.D.   On: 10/17/2016 17:09   Ct Head Wo Contrast  Result Date: 10/17/2016 CLINICAL DATA:  Altered mental status, right-sided weakness, slurred speech EXAM: CT HEAD WITHOUT CONTRAST TECHNIQUE: Contiguous axial images were obtained from the base of the skull through the vertex without intravenous contrast. COMPARISON:  None available FINDINGS: Brain: Mild age related brain atrophy and chronic white matter microvascular changes about the ventricles. No acute intracranial hemorrhage, mass lesion, definite infarction, midline shift, herniation, hydrocephalus, or extra-axial fluid collection. Normal gray-white matter differentiation. No focal  mass effect or edema. Cisterns are patent. Cerebellar atrophy as well. Vascular: Atherosclerotic vascular changes noted. Skull: Hyperostosis frontalis interna evident. No acute osseous finding or fracture. Mastoids and sinuses remain clear. Sinuses/Orbits: No acute finding. Other: None. IMPRESSION: Age related brain atrophy and minor white matter microvascular ischemic changes. No acute intracranial abnormality by noncontrast CT. Intracranial atherosclerosis Electronically Signed   By: Jerilynn Mages.  Shick M.D.   On: 10/17/2016 11:52   Ct Angio Neck W Or Wo Contrast  Result Date: 10/17/2016 CLINICAL DATA:  Stroke.  Atrial fibrillation. EXAM: CT ANGIOGRAPHY HEAD AND NECK TECHNIQUE: Multidetector CT imaging of the head and neck was performed using the standard protocol during bolus administration of intravenous contrast. Multiplanar CT image reconstructions and MIPs were obtained to evaluate the vascular anatomy. Carotid stenosis measurements (when applicable) are obtained utilizing NASCET criteria, using the distal internal carotid diameter as the denominator. CONTRAST:  50 mL Isovue 370 IV COMPARISON:  MRI head 10/17/2016 FINDINGS: CTA NECK FINDINGS Aortic arch: Atherosclerotic calcification in the aortic arch without aneurysm. Proximal great vessels widely patent Right carotid system: Right common carotid artery widely patent. Mild atherosclerotic calcification right carotid bifurcation without significant stenosis. Left carotid system: Mild atherosclerotic calcification left common carotid artery. Minimal atherosclerotic disease at the carotid bifurcation without stenosis. Vertebral arteries: Both vertebral arteries widely patent in the neck. Skeleton: Cervical disc and facet degeneration. Negative for fracture. No acute skeletal abnormality. Other neck: Negative Upper chest: Negative Review of the MIP images confirms the above findings CTA HEAD FINDINGS Anterior circulation: Mild atherosclerotic disease in the right  cavernous carotid without stenosis. Right anterior and middle cerebral arteries widely patent without stenosis Mild atherosclerotic calcification left cavernous carotid without stenosis. Left anterior cerebral artery widely patent. Left M1 widely patent. Branch occlusion of the left M3 segment supplying the left parietal lobe corresponding to acute infarct. Remainder of the left middle cerebral artery branches are normal. Posterior circulation: Both vertebral arteries widely patent without stenosis. PICA patent bilaterally. Basilar widely patent. AICA, superior cerebellar, and posterior cerebral arteries normal Venous sinuses: Negative Anatomic variants: None Delayed phase: Not performed. Review of the MIP images confirms the above findings IMPRESSION: Left M3 branch occlusion corresponding to the acute left parietal infarct. Probable embolus. Otherwise no significant intracranial stenosis No significant carotid or vertebral artery stenosis in the neck. Electronically Signed   By: Franchot Gallo M.D.   On: 10/17/2016 17:09   Mr Brain Wo Contrast  Result Date: 10/17/2016 CLINICAL DATA:  Right-sided weakness and slurred speech. Trouble finding words. EXAM: MRI HEAD WITHOUT CONTRAST TECHNIQUE: Multiplanar, multiecho pulse sequences of the brain and surrounding structures were obtained without intravenous contrast. COMPARISON:  Head CT from earlier today FINDINGS: Brain: Moderate area of cortical and white matter restricted diffusion in the posterior and superior left temporal lobe and lateral left occipital lobe. This is in the MCA distribution in there is T1 and FLAIR hyperintensity within left MCA branches seen at the level of the infarct and proximally to a M2 insular branch. No acute hemorrhage. Susceptibility artifact along the left sylvian fissure but no visible hematoma on the other sequences. Mild for age microvascular ischemic gliosis in the cerebral white matter. Probable inconsequential hygroma around the  left cerebellum. No hydrocephalus or mass. Vascular:  Slow flow/ thrombus in left MCA branches as described above. Skull and upper cervical spine: Negative Sinuses/Orbits: Bilateral cataract resection. IMPRESSION: Moderate area of acute infarct in the left temporal occipital region. Associated MCA branches show slow flow or thrombus to the level of an insular M2 branch. Electronically Signed   By: Monte Fantasia M.D.   On: 10/17/2016 13:24   Ct Cerebral Perfusion W Contrast  Result Date: 10/17/2016 CLINICAL DATA:  Stroke. Left parietal infarct on MRI. Atrial fibrillation. EXAM: CT PERFUSION BRAIN TECHNIQUE: Multiphase CT imaging of the brain was performed following IV bolus contrast injection. Subsequent parametric perfusion maps were calculated using RAPID software. CONTRAST:  40 mL Isovue 370 IV COMPARISON:  MRI head CT 10/17/2016 FINDINGS: CT Brain Perfusion Findings: CBF (<30%) Volume: 33mL Perfusion (Tmax>6.0s) volume: 93mL Mismatch Volume: 63mL Infarction Location:Increased T-max in the left parietal lobe corresponding to the area of restricted diffusion on MRI. This area does not show any decrease in cerebral blood flow. IMPRESSION: Restricted diffusion in the left parietal lobe compatible with infarct. This area shows increased T-max of 26 mL. No reduction in cerebral blood flow in this area. Possible reperfusion of infarction. Electronically Signed   By: Franchot Gallo M.D.   On: 10/17/2016 16:59        Scheduled Meds: .  stroke: mapping our early stages of recovery book   Does not apply Once  . aspirin EC  325 mg Oral Daily  . docusate sodium  100 mg Oral Daily  . omega-3 acid ethyl esters  1 g Oral Daily  . pantoprazole  40 mg Oral Daily   Continuous Infusions: . sodium chloride 75 mL/hr at 10/17/16 1852     LOS: 1 day    Time spent: [place], [place], MD [facility] Pager [phone]  If 7PM-7AM, please contact night-coverage [email or web address] Password  [email or web address] 10/18/2016, 8:03 AM

## 2016-10-18 NOTE — Progress Notes (Addendum)
ANTICOAGULATION CONSULT NOTE - Initial Consult  Pharmacy Consult for Xarelto Indication: atrial fibrillation  Allergies  Allergen Reactions  . Codeine Nausea And Vomiting  . Nsaids Other (See Comments)    GI issues  . Penicillins Itching and Rash  . Sulfonamide Derivatives Itching and Rash    Patient Measurements:  Vital Signs: Temp: 99.2 F (37.3 C) (01/08 0419) Temp Source: Oral (01/08 0419) BP: 125/58 (01/08 0419) Pulse Rate: 69 (01/08 0419)  Labs:  Recent Labs  10/17/16 1134 10/17/16 1143  HGB 13.7 12.2  HCT 40.6 36.0  PLT 191  --   APTT 40*  --   LABPROT 25.3*  --   INR 2.26  --   CREATININE 1.23* 1.40*    CrCl cannot be calculated (Unknown ideal weight.).   Medical History: Past Medical History:  Diagnosis Date  . ANXIETY DEPRESSION 12/11/2009  . Atrial fibrillation ([place]) 04/10/2007  . COLONIC POLYPS, HX OF 04/10/2007  . DISORDER, MENOPAUSAL NOS 05/15/2007  . DYSPNEA 02/11/2009  . Edema 03/13/2009  . HYPERTENSION 04/10/2007  . Osteoarth NOS-Unspec 04/10/2007  . VENOUS INSUFFICIENCY, CHRONIC 01/22/2010    Medications:  See EMR  Assessment: 83 y.o. female with medical history significant for atrial fibrillation recently transitioned from warfarin to Xarelto. Patient presented with acute stroke, confirmed on MRI. Patient was not administered IV t-PA. Neurology restarting Xarelto tonight. Pt was also on aspirin 325 mg daily that has now been discontinued. Pt family brought in medications and pill container with missed Xarelto doses several days over the last week. EstCrCl ~29.  Goal of Therapy:  anticoagulation Monitor platelets by anticoagulation protocol: Yes   Plan:  Restart Xarelto 15 mg daily Monitor CBC, s/sx of bleed, renal function   Thank you for allowing [place] to participate in this patients care. Jens Som, PharmD  Clinical phone for 10/18/2016 from 7a-3:30p: [phone] If after 3:30p, please call main pharmacy at: [phone] 10/18/2016 1:29 PM

## 2016-10-18 NOTE — Progress Notes (Signed)
I met with pt, her son, and daughter in law to discuss a possible inpt rehab admission. All prefer inpt rehab before going home with family who can arrange 24/7 supervision. I will begin insurance authorization and follow up tomorrow. [phone]

## 2016-10-18 NOTE — Care Management Note (Signed)
Case Management Note  Patient Details  Name: Bonnie Roberts MRN: [id number] Date of Birth: 1922-05-09  Subjective/Objective:     Pt admitted with CVA. She is from home with her mentally handicapped daughter.                Action/Plan: PT/OT recommending CIR. CM following for d/c disposition.   Expected Discharge Date:                  Expected Discharge Plan:  [place]  In-House Referral:     Discharge planning Services     Post Acute Care Choice:    Choice offered to:     DME Arranged:    DME Agency:     HH Arranged:    [place] Agency:     Status of Service:  In process, will continue to follow  If discussed at Long Length of Stay Meetings, dates discussed:    Additional Comments:  Pollie Friar, RN 10/18/2016, 3:13 PM
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
## 2016-10-19 ENCOUNTER — Inpatient Hospital Stay (HOSPITAL_COMMUNITY): Payer: Medicare Other

## 2016-10-19 ENCOUNTER — Other Ambulatory Visit (HOSPITAL_COMMUNITY): Payer: Medicare Other

## 2016-10-19 DIAGNOSIS — I6789 Other cerebrovascular disease: Secondary | ICD-10-CM

## 2016-10-19 LAB — COMPREHENSIVE METABOLIC PANEL
ALK PHOS: 68 U/L (ref 38–126)
ALT: 22 U/L (ref 14–54)
AST: 31 U/L (ref 15–41)
Albumin: 3.4 g/dL — ABNORMAL LOW (ref 3.5–5.0)
Anion gap: 9 (ref 5–15)
BILIRUBIN TOTAL: 1.5 mg/dL — AB (ref 0.3–1.2)
BUN: 14 mg/dL (ref 6–20)
CALCIUM: 9 mg/dL (ref 8.9–10.3)
CO2: 25 mmol/L (ref 22–32)
CREATININE: 1.17 mg/dL — AB (ref 0.44–1.00)
Chloride: 109 mmol/L (ref 101–111)
GFR, EST AFRICAN AMERICAN: 45 mL/min — AB (ref >60)
GFR, EST NON AFRICAN AMERICAN: 39 mL/min — AB (ref >60)
Glucose, Bld: 125 mg/dL — ABNORMAL HIGH (ref 65–99)
Potassium: 3.8 mmol/L (ref 3.5–5.1)
Sodium: 143 mmol/L (ref 135–145)
Total Protein: 6.4 g/dL — ABNORMAL LOW (ref 6.5–8.1)

## 2016-10-19 LAB — HEMOGLOBIN A1C
HEMOGLOBIN A1C: 5.7 % — AB (ref 4.8–5.6)
MEAN PLASMA GLUCOSE: 117 mg/dL

## 2016-10-19 LAB — ECHOCARDIOGRAM COMPLETE: Weight: 2323.2 oz

## 2016-10-19 MED ORDER — POLYETHYLENE GLYCOL 3350 17 G PO PACK
17.0000 g | PACK | Freq: Every day | ORAL | Status: DC
  Administered 2016-10-19 – 2016-10-21 (×3): 17 g via ORAL
  Filled 2016-10-19 (×3): qty 1

## 2016-10-19 NOTE — Clinical Social Work Note (Signed)
Clinical Social Work Assessment  Patient Details  Name: Bonnie Roberts MRN: [id number] Date of Birth: [birth year]  Date of referral:  10/19/16               Reason for consult:  Facility Placement, Discharge Planning                Permission sought to share information with:  Facility Sport and exercise psychologist, Family Supports Permission granted to share information::  Yes, Verbal Permission Granted  Name::     Bonnie Roberts  Agency::  SNF's  Relationship::  Son  Contact Information:  [phone]  Housing/Transportation Living arrangements for the past 2 months:  [place] of Information:  Patient, Medical Team, Adult Children Patient Interpreter Needed:  None Criminal Activity/Legal Involvement Pertinent to Current Situation/Hospitalization:  No - Comment as needed Significant Relationships:  Adult Children Lives with:  Adult Children Do you feel safe going back to the place where you live?  Yes Need for family participation in patient care:  Yes (Comment)  Care giving concerns:  PT recommending CIR. CSW initiating SNF backup.   Social Worker assessment / plan:  CSW met with patient. Children at bedside. CSW introduced role and explained that PT recommendations would be discussed. Patient and family aware of insurance denial and appeal for CIR. Patient and her children agreeable to SNF backup. No further concerns. CSW encouraged patient and her children to contact CSW as needed. CSW will continue to follow patient and her family for support and facilitate discharge to SNF once medically stable, if needed. PAS[place] pending due to anxiety and depression diagnoses.  Employment status:  Retired Nurse, adult PT Recommendations:  Inpatient [facility] / Referral to community resources:  [place]  Patient/Family's Response to care:  Patient and her children agreeable to SNF backup. Patient's children supportive and  involved in patient's care. Patient and her children appreciated social work intervention.  Patient/Family's Understanding of and Emotional Response to Diagnosis, Current Treatment, and Prognosis:  Patient and her family understand reason for current hospitalization. Patient and her family appear happy with hospital care.  Emotional Assessment Appearance:  Appears stated age Attitude/Demeanor/Rapport:  Other (Pleasant) Affect (typically observed):  Accepting, Appropriate, Calm, Pleasant Orientation:  Oriented to Self, Oriented to Place, Oriented to  Time, Oriented to Situation Alcohol / Substance use:  Never Used Psych involvement (Current and /or in the community):  No (Comment)  Discharge Needs  Concerns to be addressed:  Care Coordination Readmission within the last 30 days:  No Current discharge risk:  Dependent with Mobility Barriers to Discharge:  Awaiting State Approval ([place]), Continued Medical Work up   Candie Chroman, LCSW 10/19/2016, 2:15 PM

## 2016-10-19 NOTE — Progress Notes (Signed)
Physical Therapy Treatment Patient Details Name: Bonnie Roberts MRN: [id number] DOB: [birth year] Today's Date: 10/19/2016    History of Present Illness Bonnie Roberts is a 83 y.o. female with a Past Medical History significant for  Afib, PVD, HTN, anxiety who presents with transients right-sided weakness. Patient with left-sided stroke on MRI (L parietal infarct).     PT Comments    Patient demonstrates high fall risk with Berg score 34/56.  Continue to recommend CIR level rehab at d/c.  Some difficulty following commands during balance testing and having word finding trouble throughout session.  Will follow along acutely and anticipate CIR rehab at d/c.  Follow Up Recommendations  CIR     Equipment Recommendations  Other (comment) (TBA)    Recommendations for Other Services       Precautions / Restrictions Precautions Precautions: Fall Precaution Comments: very HOH    Mobility  Bed Mobility Overal bed mobility: Needs Assistance Bed Mobility: Sit to Supine Rolling: Min guard   Supine to sit: Supervision Sit to supine: Min guard   General bed mobility comments: assist for safety coming up to sit and assist for positioning to supine  Transfers Overall transfer level: Needs assistance   Transfers: Sit to/from Stand Sit to Stand: Supervision;Min guard         General transfer comment: imbalance standing with legs not braced against bed  Ambulation/Gait Ambulation/Gait assistance: Min assist Ambulation Distance (Feet): 75 Feet (x 2) Assistive device: None Gait Pattern/deviations: Step-through pattern;Shuffle;Decreased stride length;Trunk flexed;Wide base of support     General Gait Details: difficulty with upright posture, occasional use of wall rail, reports stiffness in ankle and neck making it more difficult to walk   Stairs            Wheelchair Mobility    Modified Rankin (Stroke Patients Only) Modified Rankin (Stroke Patients Only) Pre-Morbid  Rankin Score: No significant disability Modified Rankin: Moderately severe disability     Balance Overall balance assessment: Needs assistance   Sitting balance-Leahy Scale: Good Sitting balance - Comments: pt requires bil Ue to position and then with incr time able to static sit      10/19/16 1416  Balance  Overall balance assessment Needs assistance  Standardized Balance Assessment  Standardized Balance Assessment  Berg Balance Test  Berg Balance Test  Sit to Stand 3  Standing Unsupported 3  Sitting with Back Unsupported but Feet Supported on Floor or Stool 4  Stand to Sit 3  Transfers 3  Standing Unsupported with Eyes Closed 3  Standing Ubsupported with Feet Together 1  From Standing, Reach Forward with Outstretched Arm 2  From Standing Position, Pick up Object from Floor 3  From Standing Position, Turn to Look Behind Over each Shoulder 3  Turn 360 Degrees 2  Standing Unsupported, Alternately Place Feet on Step/Stool 1  Standing Unsupported, One Foot in Front 2  Standing on One Leg 1  Total Score 34                            Cognition Arousal/Alertness: Awake/alert Behavior During Therapy: Flat affect Overall Cognitive Status: Impaired/Different from baseline Area of Impairment: Attention;Following commands;Memory;Safety/judgement;Awareness;Problem solving   Current Attention Level: Sustained Memory: Decreased recall of precautions;Decreased short-term memory Following Commands: Follows multi-step commands inconsistently;Follows multi-step commands with increased time Safety/Judgement: Decreased awareness of deficits;Decreased awareness of safety   Problem Solving: Slow processing;Difficulty sequencing General Comments: difficulty following commands for balance testing, needed  multimodal cues and increased time    Exercises      General Comments        Pertinent Vitals/Pain Pain Assessment: No/denies pain    Home Living                       Prior Function            PT Goals (current goals can now be found in the care plan section) Acute Rehab PT Goals Patient Stated Goal: to go to CIR Progress towards PT goals: Progressing toward goals    Frequency    Min 4X/week      PT Plan Current plan remains appropriate    Co-evaluation             End of Session Equipment Utilized During Treatment: Gait belt Activity Tolerance: Patient tolerated treatment well Patient left: in bed     Time: [id number] PT Time Calculation (min) (ACUTE ONLY): 25 min  Charges:  $Gait Training: 8-22 mins $Neuromuscular Re-education: 8-22 mins                    G Codes:      Reginia Naas 10/27/16, 2:24 PM  Magda Kiel, [place] 27-Oct-2016

## 2016-10-19 NOTE — Evaluation (Signed)
Speech Language Pathology Evaluation Patient Details Name: Bonnie Roberts MRN: [id number] DOB: 1922-04-04 Today's Date: 10/19/2016 Time: [id number] SLP Time Calculation (min) (ACUTE ONLY): 28 min  Problem List:  Patient Active Problem List   Diagnosis Date Noted  . Stage 3 chronic kidney disease   . Benign essential HTN   . Acute CVA (cerebrovascular accident) ([place]) 10/17/2016  . Acute kidney injury ([place]) 10/17/2016  . Acute hyperglycemia 10/17/2016  . GERD (gastroesophageal reflux disease) 07/24/2014  . Encounter for therapeutic drug monitoring 01/24/2014  . Long term current use of anticoagulant 11/30/2010  . Venous (peripheral) insufficiency 01/22/2010  . ANXIETY DEPRESSION 12/11/2009  . Essential hypertension 04/10/2007  . Atrial fibrillation ([place]) 04/10/2007  . Osteoarthritis 04/10/2007  . COLONIC POLYPS, HX OF 04/10/2007   Past Medical History:  Past Medical History:  Diagnosis Date  . ANXIETY DEPRESSION 12/11/2009  . Atrial fibrillation ([place]) 04/10/2007  . COLONIC POLYPS, HX OF 04/10/2007  . DISORDER, MENOPAUSAL NOS 05/15/2007  . DYSPNEA 02/11/2009  . Edema 03/13/2009  . HYPERTENSION 04/10/2007  . Osteoarth NOS-Unspec 04/10/2007  . VENOUS INSUFFICIENCY, CHRONIC 01/22/2010   Past Surgical History:  Past Surgical History:  Procedure Laterality Date  . ABDOMINAL HYSTERECTOMY  1959  . BLADDER SURGERY     tacking  . CARDIAC CATHETERIZATION    . CARPAL TUNNEL RELEASE    . CHOLECYSTECTOMY  1965  . HERNIA REPAIR    . RECTOCELE REPAIR    . TOTAL KNEE ARTHROPLASTY  1993   right   HPI:  83 year old female with history of atrial fibrillation who presents with new onset expressive aphasia possible receptive aphasia. Last seen normal was yesterday. Was found today by family members in her chair not as responsive. No reported recent history of illnesses. Patient noted to have slurred speech as well as had trouble understanding her relatives. She is hard of hearing and doesn't have her  hearing aids in. Is moving all 4 extremities appropriately. Did not have any facial droop. EMS called and patient transported here. MRI on 10/17/16 revealed moderate area of acute infarct in the left temporal occipital region. Associated MCA branches show slow flow or thrombus to the level of an insular M2 branch.   Assessment / Plan / Recommendation Clinical Impression  Pt presents with mild to moderate expressive aphasia c/b phonemic paraphasias at the sentence to simple conversation level further complicated by pt's significant hearing loss. Pt benefits form semantic cues, sentence completion and repetition cues. Pt's cognitive function appears appropriate for attention, memory and higher level problem solving during this evaluation but may appear impaired during ADLs etc d/t expressive aphasia. As aphasia resolves, pt might benefit from further cognitive evaluation. Pt requires skilled ST services to address expressive aphasia. Recommend Inpatient Rehab.     SLP Assessment  Patient needs continued Speech Lanaguage Pathology Services    Follow Up Recommendations  Inpatient Rehab    Frequency and Duration min 2x/week  2 weeks      SLP Evaluation Cognition  Overall Cognitive Status: Within Functional Limits for tasks assessed (May appear impaired d/t expressive aphasia) Arousal/Alertness: Awake/alert Orientation Level: Oriented X4 Attention: Selective Selective Attention: Appears intact Memory: Appears intact Awareness: Appears intact Problem Solving: Appears intact Safety/Judgment: Appears intact       Comprehension  Auditory Comprehension Overall Auditory Comprehension: Appears within functional limits for tasks assessed Yes/No Questions: Within Functional Limits Commands: Within Functional Limits Conversation: Simple Other Conversation Comments:  (Impaired d/t expressive aphasia) EffectiveTechniques: Repetition Visual Recognition/Discrimination Discrimination: Not  tested Reading Comprehension Reading Status: Not tested    Expression Expression Primary Mode of Expression: Verbal Verbal Expression Overall Verbal Expression: Impaired Initiation: No impairment Automatic Speech: Name;Counting Level of Generative/Spontaneous Verbalization: Sentence Repetition: Impaired Level of Impairment: Word level Naming: Impairment Responsive: 26-50% accurate Verbal Errors: Phonemic paraphasias Pragmatics: No impairment Effective Techniques: Sentence completion;Phonemic cues;Semantic cues;Open ended questions Non-Verbal Means of Communication: Not applicable Written Expression Dominant Hand: Right Written Expression: Within Functional Limits   Oral / Motor  Oral Motor/Sensory Function Overall Oral Motor/Sensory Function: Within functional limits Motor Speech Overall Motor Speech: Appears within functional limits for tasks assessed Respiration: Within functional limits Phonation: Normal Resonance: Within functional limits Articulation: Within functional limitis Intelligibility: Intelligible Motor Planning: Witnin functional limits Motor Speech Errors: Not applicable   GO                   Janaisa Birkland B. Rutherford Nail, M.S., CCC-SLP Speech-Language Pathologist [phone] Shevelle Smither Rutherford Nail 10/19/2016, 3:47 PM

## 2016-10-19 NOTE — Progress Notes (Signed)
Occupational Therapy Treatment Patient Details Name: Bonnie Roberts MRN: [id number] DOB: May 17, 1922 Today's Date: 10/19/2016    History of present illness Bonnie Roberts is a 83 y.o. female with a Past Medical History significant for  Afib, PVD, HTN, anxiety who presents with transients right-sided weakness. Patient with left-sided stroke on MRI (L parietal infarct).    OT comments  Pt demonstrates visual deficits that will affect all aspects of ADLS and IADLS. Pt at baseline drives and cares for others. Pt with word finding deficits during session.  See vision below. Cognitive executive errors made and lack of awareness. Pt unable to draw the time 10:15 and normally patient is managing her own bills and medications at home.   Follow Up Recommendations  CIR    Equipment Recommendations  None recommended by OT    Recommendations for Other Services Rehab consult    Precautions / Restrictions Precautions Precautions: Fall       Mobility Bed Mobility Overal bed mobility: Needs Assistance Bed Mobility: Supine to Sit;Rolling Rolling: Min guard   Supine to sit: Min guard     General bed mobility comments: requires incr time to complete task. Family member present reports "do i need to help her" due to patients efforts and incr time to complete task. pt requires use of bed rail to come into static sitting  Transfers                      Balance Overall balance assessment: Needs assistance   Sitting balance-Leahy Scale: Good Sitting balance - Comments: pt requires bil Ue to position and then with incr time able to static sit                            ADL                                                Vision                 Additional Comments: Pt provided a circle with 10:15 at the top of the page. pt asked to draw the clock time listed. pt repeats "10:15-- draw that time?" Pt writes 10 / ( lash sign) 15 as a fraction on the R  side of the clock. pt provided max cues and unable to preceed with task after 3 additional attempts. the patient was unable to problem solve a different answer. pt with additional head tilts and shifts. pt asked to make out a number located in a scattered pattern Pt starts in the R bottom corner of the paper and works verticall ascending pattern then horizontal and then a random search pattern. pt demonstrates decr visual organization that will affect adls and iadls and ability to read. pt provided a more complex word search and asked to locate "EE" pt starts off correctly locating two items again in the R quadrant of vision. pt then starts tapping the "EE" on the top of the page and searching due to inability to remember the instructions without visually attending to it. Pt circling "e" and "e" to make "EE" because after 2 minutes forgot which item she was searching for so she combined indivisual letters. pt states "i saw 3 Roberts  so i just circled them all to make sure i had  enough of them"  Pt asked to draw a line through the middle of a horizontal line. pt  making mutliple vertical marks because she could only remember the first command. Pt demonstrates decr multiple step commands. Pt states " only being able to see half of OT marker in the L eye peripheral field ( L visual half of the L eye) pt reports vision is Blurred when bil eyes are occluded. Pt reports baseline L eye deficits but R eye is normally good . Pt reports "R eye " as bad today. Pt advised to consult MD and not drive until told she is allowed to do so. Pt states "oh I havent messed up my record and I dont want to hurt anyone. I dont think I did very good on this test today"    Perception     Praxis      Cognition   Behavior During Therapy: Flat affect Overall Cognitive Status: Impaired/Different from baseline                  General Comments: Pt reports "i had a spoke" when asked what happen to you. Pt unaware of ignore with  speech. pt requires incr time with visual scanning and visually required looking at instructions each time due to inability to remember the instructions. (see note on vision)     Extremity/Trunk Assessment               Exercises     Shoulder Instructions       General Comments      Pertinent Vitals/ Pain       Pain Assessment: No/denies pain  Home Living                                          Prior Functioning/Environment              Frequency  Min 3X/week        Progress Toward Goals  OT Goals(current goals can now be found in the care plan section)  Progress towards OT goals: Progressing toward goals  Acute Rehab OT Goals Patient Stated Goal: to go to CIR OT Goal Formulation: With patient/family Time For Goal Achievement: 11/01/16 Potential to Achieve Goals: Good ADL Goals Pt Will Perform Grooming: with supervision;standing Pt Will Perform Upper Body Bathing: with supervision;sitting Pt Will Perform Lower Body Bathing: with supervision;sit to/from stand Pt Will Transfer to Toilet: with supervision;ambulating;regular height toilet Additional ADL Goal #1: Pt will complete an adl task with no visual cues at supervision level   Plan Discharge plan remains appropriate    Co-evaluation                 End of Session     Activity Tolerance Patient tolerated treatment well   Patient Left in bed;with call bell/phone within reach;with bed alarm set;with family/visitor present   Nurse Communication Mobility status;Precautions        Time: [id number] OT Time Calculation (min): 26 min  Charges: OT General Charges $OT Visit: 1 Procedure OT Treatments $Therapeutic Activity: 23-37 mins  Parke Poisson B 10/19/2016, 1:53 PM   Jeri Modena   OTR/L Pager: [phone] Office: [phone] .

## 2016-10-19 NOTE — Progress Notes (Signed)
  Echocardiogram 2D Echocardiogram has been performed.  Bonnie Roberts 10/19/2016, 3:07 PM

## 2016-10-19 NOTE — Progress Notes (Signed)
PROGRESS NOTE    Bonnie Roberts  [id number] DOB: 05/03/1922 DOA: 10/17/2016 PCP: Nyoka Cowden, MD    Brief Narrative:   83 y/o ? Known history chronic atrial fibrillation on Coumadin in the past--transitioned from coumadin recently to [place] 08/21/2012 low risk nuclear study Prior heart cath 2006 Prior colonoscopy 05/2004 Degenerative joint disease HTN Chronic venous insufficiency  Admitted with altered state last known normal prior to admission the night before Very still drives, lives with her mentally handicapped 18 year old daughter and is fully functional  On admission apparently was unresponsive TPA was not given and it was unable to obtain review of systems due to altered state  Currently pending placement either at skilled or inpatient rehabilitation echo is pending and speech therapy input is pending   Assessment & Plan:   Principal Problem:   Acute CVA (cerebrovascular accident) [facility]) Active Problems:   ANXIETY DEPRESSION   Essential hypertension   Atrial fibrillation ([place])   Venous (peripheral) insufficiency   Acute kidney injury ([place])   Acute hyperglycemia   Stage 3 chronic kidney disease   Benign essential HTN  Left temporal occipital Acute CVA patient's main cognitive deficit currently is word salad and she has difficulty with executive higher level to start motion such as finger-nose-finger test Agreed patient may benefit from CIR placement and consult has been placed Please see neurology progess note for work up to date Anticoagulant Xarelto, discontinue aspirin + increased statin to 40 mg high intensity dosing Continue IV saline 75 cc/H SLP cog and speech eval pending still  HTN allow slightly permissive hypertension in setting of acute CVA  Atrial fibrillation, [place] score 4-5 Rate controlled without medication. Usually on Atenolol 12.5 daily/Cardizem 120 XL Holding lasix 40 bid  GERd Cont Protonix  Venous insuff Stable  currently      DVT prophylaxis: anticoagulated-Xatrelto Code Status: DO NOT RESUSCITATE  Family Communication: Discussed with son, relatives in room Disposition Plan: inpatient   Consultants:   Neuro  Procedures:   ECho pedning  Antimicrobials:    nad   Subjective:  Alert pleasant oriented much clearer in terms of speech Can now do finger-nose-finger test Made aware of disposition issues  Objective: Vitals:   10/18/16 2122 10/19/16 0137 10/19/16 0609 10/19/16 0905  BP: (!) 131/54 128/63 (!) 142/87 (!) 124/49  Pulse: 72 61 74 79  Resp: 16 16 16 16   Temp: 99.3 F (37.4 C) 99 F (37.2 C) 99.4 F (37.4 C) 98.1 F (36.7 C)  TempSrc: Oral Oral Oral Oral  SpO2: 97% 98% 96% 98%  Weight:        Intake/Output Summary (Last 24 hours) at 10/19/16 1543 Last data filed at 10/19/16 0905  Gross per 24 hour  Intake              240 ml  Output                0 ml  Net              240 ml   Filed Weights   10/18/16 1500  Weight: 65.9 kg (145 lb 3.2 oz)    Examination:  General exam: calm and comfortable  Respiratory system: Clear to auscultation. Respiratory effort normal. Cardiovascular system: S1 & S2 heard, IRREG IRREG Gastrointestinal system: Abdomen is nondistended, soft and nontender.  Central nervous system: Alert and oriented. Finger nose finger unable to perform, Word salad Extremities: Symmetric 5 x 5 power. Skin: No rashes, lesions or ulcers Psychiatry: Judgement  and insight appear normal. Mood & affect appropriate.     Data Reviewed: I have personally reviewed following labs and imaging studies  CBC:  Recent Labs Lab 10/17/16 1134 10/17/16 1143  WBC 5.3  --   NEUTROABS 3.6  --   HGB 13.7 12.2  HCT 40.6 36.0  MCV 95.3  --   PLT 191  --    Basic Metabolic Panel:  Recent Labs Lab 10/17/16 1134 10/17/16 1143 10/19/16 0834  NA 142 144 143  K 4.1 4.2 3.8  CL 104 100* 109  CO2 30  --  25  GLUCOSE 142* 137* 125*  BUN 20 24* 14    CREATININE 1.23* 1.40* 1.17*  CALCIUM 9.1  --  9.0   GFR: Estimated Creatinine Clearance: 25.7 mL/min (by C-G formula based on SCr of 1.17 mg/dL (H)). Liver Function Tests:  Recent Labs Lab 10/17/16 1134 10/19/16 0834  AST 32 31  ALT 25 22  ALKPHOS 70 68  BILITOT 0.7 1.5*  PROT 6.7 6.4*  ALBUMIN 3.6 3.4*   No results for input(s): LIPASE, AMYLASE in the last 168 hours. No results for input(s): AMMONIA in the last 168 hours. Coagulation Profile:  Recent Labs Lab 10/17/16 1134  INR 2.26   Cardiac Enzymes: No results for input(s): CKTOTAL, CKMB, CKMBINDEX, TROPONINI in the last 168 hours. BNP (last 3 results) No results for input(s): PROBNP in the last 8760 hours. HbA1C:  Recent Labs  10/18/16 0435  HGBA1C 5.7*   CBG: No results for input(s): GLUCAP in the last 168 hours. Lipid Profile:  Recent Labs  10/18/16 0435  CHOL 171  HDL 45  LDLCALC 116*  TRIG 49  CHOLHDL 3.8   Thyroid Function Tests: No results for input(s): TSH, T4TOTAL, FREET4, T3FREE, THYROIDAB in the last 72 hours. Anemia Panel: No results for input(s): VITAMINB12, FOLATE, FERRITIN, TIBC, IRON, RETICCTPCT in the last 72 hours. Sepsis Labs: No results for input(s): PROCALCITON, LATICACIDVEN in the last 168 hours.  No results found for this or any previous visit (from the past 240 hour(s)).       Radiology Studies: Ct Angio Head W Or Wo Contrast  Result Date: 10/17/2016 CLINICAL DATA:  Stroke.  Atrial fibrillation. EXAM: CT ANGIOGRAPHY HEAD AND NECK TECHNIQUE: Multidetector CT imaging of the head and neck was performed using the standard protocol during bolus administration of intravenous contrast. Multiplanar CT image reconstructions and MIPs were obtained to evaluate the vascular anatomy. Carotid stenosis measurements (when applicable) are obtained utilizing NASCET criteria, using the distal internal carotid diameter as the denominator. CONTRAST:  50 mL Isovue 370 IV COMPARISON:  MRI head  10/17/2016 FINDINGS: CTA NECK FINDINGS Aortic arch: Atherosclerotic calcification in the aortic arch without aneurysm. Proximal great vessels widely patent Right carotid system: Right common carotid artery widely patent. Mild atherosclerotic calcification right carotid bifurcation without significant stenosis. Left carotid system: Mild atherosclerotic calcification left common carotid artery. Minimal atherosclerotic disease at the carotid bifurcation without stenosis. Vertebral arteries: Both vertebral arteries widely patent in the neck. Skeleton: Cervical disc and facet degeneration. Negative for fracture. No acute skeletal abnormality. Other neck: Negative Upper chest: Negative Review of the MIP images confirms the above findings CTA HEAD FINDINGS Anterior circulation: Mild atherosclerotic disease in the right cavernous carotid without stenosis. Right anterior and middle cerebral arteries widely patent without stenosis Mild atherosclerotic calcification left cavernous carotid without stenosis. Left anterior cerebral artery widely patent. Left M1 widely patent. Branch occlusion of the left M3 segment supplying the left parietal  lobe corresponding to acute infarct. Remainder of the left middle cerebral artery branches are normal. Posterior circulation: Both vertebral arteries widely patent without stenosis. PICA patent bilaterally. Basilar widely patent. AICA, superior cerebellar, and posterior cerebral arteries normal Venous sinuses: Negative Anatomic variants: None Delayed phase: Not performed. Review of the MIP images confirms the above findings IMPRESSION: Left M3 branch occlusion corresponding to the acute left parietal infarct. Probable embolus. Otherwise no significant intracranial stenosis No significant carotid or vertebral artery stenosis in the neck. Electronically Signed   By: Franchot Gallo M.D.   On: 10/17/2016 17:09   Ct Angio Neck W Or Wo Contrast  Result Date: 10/17/2016 CLINICAL DATA:  Stroke.   Atrial fibrillation. EXAM: CT ANGIOGRAPHY HEAD AND NECK TECHNIQUE: Multidetector CT imaging of the head and neck was performed using the standard protocol during bolus administration of intravenous contrast. Multiplanar CT image reconstructions and MIPs were obtained to evaluate the vascular anatomy. Carotid stenosis measurements (when applicable) are obtained utilizing NASCET criteria, using the distal internal carotid diameter as the denominator. CONTRAST:  50 mL Isovue 370 IV COMPARISON:  MRI head 10/17/2016 FINDINGS: CTA NECK FINDINGS Aortic arch: Atherosclerotic calcification in the aortic arch without aneurysm. Proximal great vessels widely patent Right carotid system: Right common carotid artery widely patent. Mild atherosclerotic calcification right carotid bifurcation without significant stenosis. Left carotid system: Mild atherosclerotic calcification left common carotid artery. Minimal atherosclerotic disease at the carotid bifurcation without stenosis. Vertebral arteries: Both vertebral arteries widely patent in the neck. Skeleton: Cervical disc and facet degeneration. Negative for fracture. No acute skeletal abnormality. Other neck: Negative Upper chest: Negative Review of the MIP images confirms the above findings CTA HEAD FINDINGS Anterior circulation: Mild atherosclerotic disease in the right cavernous carotid without stenosis. Right anterior and middle cerebral arteries widely patent without stenosis Mild atherosclerotic calcification left cavernous carotid without stenosis. Left anterior cerebral artery widely patent. Left M1 widely patent. Branch occlusion of the left M3 segment supplying the left parietal lobe corresponding to acute infarct. Remainder of the left middle cerebral artery branches are normal. Posterior circulation: Both vertebral arteries widely patent without stenosis. PICA patent bilaterally. Basilar widely patent. AICA, superior cerebellar, and posterior cerebral arteries normal  Venous sinuses: Negative Anatomic variants: None Delayed phase: Not performed. Review of the MIP images confirms the above findings IMPRESSION: Left M3 branch occlusion corresponding to the acute left parietal infarct. Probable embolus. Otherwise no significant intracranial stenosis No significant carotid or vertebral artery stenosis in the neck. Electronically Signed   By: Franchot Gallo M.D.   On: 10/17/2016 17:09   Ct Cerebral Perfusion W Contrast  Result Date: 10/17/2016 CLINICAL DATA:  Stroke. Left parietal infarct on MRI. Atrial fibrillation. EXAM: CT PERFUSION BRAIN TECHNIQUE: Multiphase CT imaging of the brain was performed following IV bolus contrast injection. Subsequent parametric perfusion maps were calculated using RAPID software. CONTRAST:  40 mL Isovue 370 IV COMPARISON:  MRI head CT 10/17/2016 FINDINGS: CT Brain Perfusion Findings: CBF (<30%) Volume: 61mL Perfusion (Tmax>6.0s) volume: 101mL Mismatch Volume: 17mL Infarction Location:Increased T-max in the left parietal lobe corresponding to the area of restricted diffusion on MRI. This area does not show any decrease in cerebral blood flow. IMPRESSION: Restricted diffusion in the left parietal lobe compatible with infarct. This area shows increased T-max of 26 mL. No reduction in cerebral blood flow in this area. Possible reperfusion of infarction. Electronically Signed   By: Franchot Gallo M.D.   On: 10/17/2016 16:59  Scheduled Meds: .  stroke: mapping our early stages of recovery book   Does not apply Once  . atorvastatin  40 mg Oral q1800  . docusate sodium  100 mg Oral Daily  . omega-3 acid ethyl esters  1 g Oral Daily  . pantoprazole  40 mg Oral Daily  . polyethylene glycol  17 g Oral Daily  . rivaroxaban  15 mg Oral Q supper   Continuous Infusions: . sodium chloride 75 mL/hr at 10/18/16 1622     LOS: 2 days    Time spent: [place], Aneth, MD [facility] Pager [phone]  If 7PM-7AM,  please contact night-coverage [email or web address] Password [facility] 10/19/2016, 3:43 PM

## 2016-10-19 NOTE — Clinical Social Work Placement (Signed)
   CLINICAL SOCIAL WORK PLACEMENT  NOTE  Date:  10/19/2016  Patient Details  Name: Bonnie Roberts MRN: [id number] Date of Birth: 1922/09/06  Clinical Social Work is seeking post-discharge placement for this patient at the [place] level of care (*CSW will initial, date and re-position this form in  chart as items are completed):  Yes   Patient/family provided with [place] Work Department's list of facilities offering this level of care within the geographic area requested by the patient (or if unable, by the patient's family).  Yes   Patient/family informed of their freedom to choose among providers that offer the needed level of care, that participate in Medicare, Medicaid or managed care program needed by the patient, have an available bed and are willing to accept the patient.  Yes   Patient/family informed of [facility]'s ownership interest in [facility] and [facility], as well as of the fact that they are under no obligation to receive care at these facilities.  PASRR submitted to EDS on 10/19/16     PASRR number received on       Existing PASRR number confirmed on       FL2 transmitted to all facilities in geographic area requested by pt/family on 10/19/16     FL2 transmitted to all facilities within larger geographic area on       Patient informed that his/her managed care company has contracts with or will negotiate with certain facilities, including the following:            Patient/family informed of bed offers received.  Patient chooses bed at       Physician recommends and patient chooses bed at      Patient to be transferred to   on  .  Patient to be transferred to facility by       Patient family notified on   of transfer.  Name of family member notified:        PHYSICIAN Please sign FL2     Additional Comment:    _______________________________________________ Candie Chroman, LCSW 10/19/2016, 2:18 PM

## 2016-10-19 NOTE — Progress Notes (Signed)
STROKE TEAM PROGRESS NOTE    SUBJECTIVE (INTERVAL HISTORY) Her family is at the bedside. Apparently, insurance has denied CIR. They are working on an appeal.    OBJECTIVE Temp:  [98.1 F (36.7 C)-99.4 F (37.4 C)] 98.1 F (36.7 C) (01/09 0905) Pulse Rate:  [61-79] 79 (01/09 0905) Cardiac Rhythm: Atrial fibrillation (01/09 0701) Resp:  [16] 16 (01/09 0905) BP: (124-142)/(49-87) 124/49 (01/09 0905) SpO2:  [94 %-98 %] 98 % (01/09 0905) Weight:  [65.9 kg (145 lb 3.2 oz)] 65.9 kg (145 lb 3.2 oz) (01/08 1500)  CBC:   Recent Labs Lab 10/17/16 1134 10/17/16 1143  WBC 5.3  --   NEUTROABS 3.6  --   HGB 13.7 12.2  HCT 40.6 36.0  MCV 95.3  --   PLT 191  --     Basic Metabolic Panel:   Recent Labs Lab 10/17/16 1134 10/17/16 1143 10/19/16 0834  NA 142 144 143  K 4.1 4.2 3.8  CL 104 100* 109  CO2 30  --  25  GLUCOSE 142* 137* 125*  BUN 20 24* 14  CREATININE 1.23* 1.40* 1.17*  CALCIUM 9.1  --  9.0    Lipid Panel:     Component Value Date/Time   CHOL 171 10/18/2016 0435   TRIG 49 10/18/2016 0435   HDL 45 10/18/2016 0435   CHOLHDL 3.8 10/18/2016 0435   VLDL 10 10/18/2016 0435   LDLCALC 116 (H) 10/18/2016 0435   HgbA1c:  Lab Results  Component Value Date   HGBA1C 5.7 (H) 10/18/2016   Urine Drug Screen:     Component Value Date/Time   LABOPIA NONE DETECTED 10/17/2016 1220   COCAINSCRNUR NONE DETECTED 10/17/2016 1220   LABBENZ NONE DETECTED 10/17/2016 1220   AMPHETMU NONE DETECTED 10/17/2016 1220   THCU NONE DETECTED 10/17/2016 1220   LABBARB NONE DETECTED 10/17/2016 1220      IMAGING  Ct Head Wo Contrast 10/17/2016 Age related brain atrophy and minor white matter microvascular ischemic changes. No acute intracranial abnormality by noncontrast CT. Intracranial atherosclerosis   Ct Angio Head W Or Wo Contrast Ct Angio Neck W Or Wo Contrast 10/17/2016 Left M3 branch occlusion corresponding to the acute left parietal infarct. Probable embolus. Otherwise no  significant intracranial stenosis No significant carotid or vertebral artery stenosis in the neck.   Ct Cerebral Perfusion W Contrast 10/17/2016 Restricted diffusion in the left parietal lobe compatible with infarct. This area shows increased T-max of 26 mL. No reduction in cerebral blood flow in this area. Possible reperfusion of infarction.   Mr Brain Wo Contrast 10/17/2016 Moderate area of acute infarct in the left temporal occipital region. Associated MCA branches show slow flow or thrombus to the level of an insular M2 branch.    PHYSICAL EXAM Pleasant elderly Caucasian lady not in distress. . Afebrile. Head is nontraumatic. Neck is supple without bruit.    Cardiac exam no murmur or gallop. Lungs are clear to auscultation. Distal pulses are well felt. Neurological Exam :  Awake alert oriented 2. Dementia tension, registration and recall. Follows only simple one-step commands. No dysarthria but slightly nonfluent speech. Right homonymous hemianopsia and will not blink to threat on the right but will do so on the left. Mild right lower facial is symmetric. Tongue midline. Motor system exam she can move all 4 extremities well against gravity but does not move the right hand is as much. Appears to withdraw to painful stimuli in all 4 extremities well. Deep tendon reflexes are symmetric. Plantars  downgoing. Gait was not tested. ASSESSMENT/PLAN Bonnie Roberts is a 83 y.o. female with history of atrial fibrillation recently transitioned from Coumadin to Xarelto, peripheral venous insufficiency on Lasix, hypertension, anxiety and depression, hard of hearing, osteoarthritis, GERD presenting with aphasia. She did not receive IV t-PA due to unclear time of onset.   Stroke:   left temporal occipital infarct embolic secondary to known atrial fibrillation  Resultant  right homonymous hemianopsia  CT head/neck L M3 occlusion  CT perfuision restricted diffusion compatible with infarct  MRI  L  temporal occipital infarct. Slow flow L MCA M2 branches 2D Echo  Left ventricle: The cavity size was normal. Systolic function was   normal. The estimated ejection fraction was in the range of 55%   to 60%. Wall motion was normal; there were no regional wall    motion abnormalities.  LDL 116  HgbA1c 5.7  SCDs for VTE prophylaxis Diet Heart Room service appropriate? Yes; Fluid consistency: Thin  Xarelto (rivaroxaban) daily prior to admission, now on aspirin 325 mg daily . Safe to go ahead and resume xarelto. Still stop aspirin  Patient counseled to be compliant with her antithrombotic medications  Ongoing aggressive stroke risk factor management  Therapy recommendations:  CIR. Consulted. Insurance approval/denial currenly being navigated by admissions coordinator  Disposition:  pending  Atrial Fibrillation  Home anticoagulation:  Xarelto (rivaroxaban) daily  She had a few missed doses of xarelto, likely leading to the stroke  Resume xarelto. No indication to change agents   Hypertension  Stable  Permissive hypertension (OK if < 220/120) but gradually normalize in 5-7 days  Long-term BP goal normotensive  Hyperlipidemia  Home meds:  No statin, on fish oil which was resumed in hospital  LDL 116, goal < 70  Add low dose stain  Continue statin at discharge  Other Stroke Risk Factors  Advanced age  Family hx stroke (father)  Other Active Problems  AKI  Anxiety/depression  Venous insufficiency  Acute hyperglycemia  Hospital day # 2  Radene Journey [facility] for Pager information 10/19/2016 11:47 AM   I have personally examined this patient, reviewed notes, independently viewed imaging studies, participated in medical decision making and plan of care.ROS completed by me personally and pertinent positives fully documented  I have made any additions or clarifications directly to the above note. Agree with note above.  Follow-up as an  outpatient in the stroke clinic in 6 weeks. Stroke team will sign off.  Antony Contras, MD Medical Director [facility] Pager: [phone] 10/19/2016 4:33 PM   To contact Stroke Continuity provider, please refer to [email or web address] After hours, contact General Neurology

## 2016-10-19 NOTE — Progress Notes (Signed)
[facility] has denied approval for CIR admission. I have contacted her son and he is requesting an expedited appeal with [employer] which I have initiated. I will alert RN CM, SW and Attending MD. [phone]

## 2016-10-19 NOTE — NC FL2 (Signed)
[place] LEVEL OF CARE SCREENING TOOL     IDENTIFICATION  Patient Name: Bonnie Roberts Birthdate: [birth year] Sex: female Admission Date (Current Location): 10/17/2016  [facility] and [place] Number:  Herbalist and Address:  [facility] [place], [place] [address], [place], [place] [address]      Provider Number: [id number]  Attending Physician Name and Address:  Nita Sells, MD  Relative Name and Phone Number:       Current Level of Care: Hospital Recommended Level of Care: [place] Prior Approval Number:    Date Approved/Denied:   PASRR Number: Pending  Discharge Plan: SNF    Current Diagnoses: Patient Active Problem List   Diagnosis Date Noted  . Stage 3 chronic kidney disease   . Benign essential HTN   . Acute CVA (cerebrovascular accident) ([place]) 10/17/2016  . Acute kidney injury ([place]) 10/17/2016  . Acute hyperglycemia 10/17/2016  . GERD (gastroesophageal reflux disease) 07/24/2014  . Encounter for therapeutic drug monitoring 01/24/2014  . Long term current use of anticoagulant 11/30/2010  . Venous (peripheral) insufficiency 01/22/2010  . ANXIETY DEPRESSION 12/11/2009  . Essential hypertension 04/10/2007  . Atrial fibrillation ([place]) 04/10/2007  . Osteoarthritis 04/10/2007  . COLONIC POLYPS, HX OF 04/10/2007    Orientation RESPIRATION BLADDER Height & Weight     Self, Time, Situation, Place  Normal Continent Weight: 145 lb 3.2 oz (65.9 kg) Height:     BEHAVIORAL SYMPTOMS/MOOD NEUROLOGICAL BOWEL NUTRITION STATUS   (None)  (None) Continent Diet (Heart healthy)  AMBULATORY STATUS COMMUNICATION OF NEEDS Skin   Limited Assist Verbally Normal                       Personal Care Assistance Level of Assistance  Bathing, Feeding, Dressing Bathing Assistance: Limited assistance Feeding assistance: Limited assistance Dressing Assistance: Limited assistance     Functional Limitations Info  Sight,  Hearing, Speech Sight Info: Adequate Hearing Info: Adequate Speech Info: Adequate    SPECIAL CARE FACTORS FREQUENCY  PT (By licensed PT), OT (By licensed OT), Blood pressure     PT Frequency: 5 x week OT Frequency: 5 x week            Contractures Contractures Info: Not present    Additional Factors Info  Code Status, Allergies, Psychotropic Code Status Info: DNR Allergies Info: Codeine, Nsaids, Penicillins, Sulfonamide Derivatives Psychotropic Info: Anxiety, Depression:          Current Medications (10/19/2016):  This is the current hospital active medication list Current Facility-Administered Medications  Medication Dose Route Frequency Provider Last Rate Last Dose  .  stroke: mapping our early stages of recovery book   Does not apply Once Samella Parr, NP      . 0.9 %  sodium chloride infusion   Intravenous Continuous Samella Parr, NP 75 mL/hr at 10/18/16 1622    . acetaminophen (TYLENOL) tablet 650 mg  650 mg Oral Q4H PRN Samella Parr, NP       Or  . acetaminophen (TYLENOL) solution 650 mg  650 mg Per Tube Q4H PRN Samella Parr, NP       Or  . acetaminophen (TYLENOL) suppository 650 mg  650 mg Rectal Q4H PRN Samella Parr, NP      . atorvastatin (LIPITOR) tablet 40 mg  40 mg Oral q1800 Nita Sells, MD   40 mg at 10/18/16 1731  . docusate sodium (COLACE) capsule 100 mg  100 mg Oral Daily Samella Parr, NP   100 mg at 10/19/16 0919  . omega-3 acid ethyl esters (LOVAZA) capsule 1 g  1 g Oral Daily Samella Parr, NP   1 g at 10/19/16 0919  . pantoprazole (PROTONIX) EC tablet 40 mg  40 mg Oral Daily Samella Parr, NP   40 mg at 10/19/16 0919  . polyethylene glycol (MIRALAX / GLYCOLAX) packet 17 g  17 g Oral Daily Nita Sells, MD   17 g at 10/19/16 0919  . Rivaroxaban (XARELTO) tablet 15 mg  15 mg Oral Q supper Nita Sells, MD   15 mg at 10/18/16 1731     Discharge Medications: Please see discharge summary for a list of discharge  medications.  Relevant Imaging Results:  Relevant Lab Results:   Additional Information SS#: [id number]  Candie Chroman, LCSW

## 2016-10-20 MED ORDER — ACETAMINOPHEN 325 MG PO TABS: 650.0000 mg | ORAL_TABLET | Freq: Four times a day (QID) | ORAL | Status: AC | PRN

## 2016-10-20 MED ORDER — RIVAROXABAN 15 MG PO TABS: 15.0000 mg | ORAL_TABLET | Freq: Every day | ORAL | Status: AC

## 2016-10-20 MED ORDER — ATORVASTATIN CALCIUM 40 MG PO TABS: 40.0000 mg | ORAL_TABLET | Freq: Every day | ORAL | Status: AC

## 2016-10-20 NOTE — Discharge Instructions (Signed)
Information on my medicine - XARELTO (Rivaroxaban)  This medication education was reviewed with me or my healthcare representative as part of my discharge preparation.  The pharmacist that spoke with me during my hospital stay was:  Tonya  Why was Xarelto prescribed for you? Xarelto was prescribed for you to reduce the risk of a blood clot forming that can cause a stroke if you have a medical condition called atrial fibrillation (a type of irregular heartbeat).  What do you need to know about xarelto ? Take your Xarelto ONCE DAILY at the same time every day with your evening meal. If you have difficulty swallowing the tablet whole, you may crush it and mix in applesauce just prior to taking your dose.  Take Xarelto exactly as prescribed by your doctor and DO NOT stop taking Xarelto without talking to the doctor who prescribed the medication.  Stopping without other stroke prevention medication to take the place of Xarelto may increase your risk of developing a clot that causes a stroke.  Refill your prescription before you run out.  After discharge, you should have regular check-up appointments with your healthcare provider that is prescribing your Xarelto.  In the future your dose may need to be changed if your kidney function or weight changes by a significant amount.  What do you do if you miss a dose? If you are taking Xarelto ONCE DAILY and you miss a dose, take it as soon as you remember on the same day then continue your regularly scheduled once daily regimen the next day. Do not take two doses of Xarelto at the same time or on the same day.   Important Safety Information A possible side effect of Xarelto is bleeding. You should call your healthcare provider right away if you experience any of the following: ? Bleeding from an injury or your nose that does not stop. ? Unusual colored urine (red or dark brown) or unusual colored stools (red or black). ? Unusual bruising for  unknown reasons. ? A serious fall or if you hit your head (even if there is no bleeding).  Some medicines may interact with Xarelto and might increase your risk of bleeding while on Xarelto. To help avoid this, consult your healthcare provider or pharmacist prior to using any new prescription or non-prescription medications, including herbals, vitamins, non-steroidal anti-inflammatory drugs (NSAIDs) and supplements.  This website has more information on Xarelto: [email or web address].[email or web address]   Ischemic Stroke An ischemic stroke (cerebrovascular accident, or CVA) is the sudden death of brain tissue that occurs when an area of the brain does not get enough oxygen. It is a medical emergency that must be treated right away. An ischemic stroke can cause permanent loss of brain function. This can cause problems with how different parts of your body function. What are the causes? This condition is caused by a decrease of oxygen supply to an area of the brain, which may be the result of:  A small blood clot (embolus) or a buildup of plaque in the blood vessels (atherosclerosis) that blocks blood flow in the brain.  An abnormal heart rhythm (atrial fibrillation).  A blocked or damaged artery in the head or neck. What increases the risk? Certain factors may make you more likely to develop this condition. Some of these factors are things that you can change, such as:  Obesity.  Smoking cigarettes.  Taking oral birth control, especially if you also use tobacco.  Physical inactivity.  Excessive alcohol use.  Use of illegal  drugs, especially cocaine and methamphetamine. Other risk factors include:  High blood pressure (hypertension).  High cholesterol.  Diabetes mellitus.  Heart disease.  Being [place] American, Native American, Hispanic, or [place] Native.  Being over age 15.  Family history of stroke.  Previous history of blood clots, stroke, or transient ischemic attack (TIA).  Sickle  cell disease.  Being a woman with a history of preeclampsia.  Migraine headache.  Sleep apnea.  Irregular heartbeats, such as atrial fibrillation.  Chronic inflammatory diseases, such as rheumatoid arthritis or lupus.  Blood clotting disorders (hypercoagulable state). What are the signs or symptoms? Symptoms of this condition usually develop suddenly, or you may notice them after waking up from sleep. Symptoms may include sudden:  Weakness or numbness in your face, arm, or leg, especially on one side of your body.  Trouble walking or difficulty moving your arms or legs.  Loss of balance or coordination.  Confusion.  Slurred speech (dysarthria).  Trouble speaking, understanding speech, or both (aphasia).  Vision changes--such as double vision, blurred vision, or loss of vision--inone or both eyes.  Dizziness.  Nausea and vomiting.  Severe headache with no known cause. The headache is often described as the worst headache ever experienced. If possible, make note of the exact time that you last felt like your normal self and what time your symptoms started. Tell your health care provider. If symptoms come and go, this could be a sign of a warning stroke, or TIA. Get help right away, even if you feel better. How is this diagnosed? This condition may be diagnosed based on:  Your symptoms, your medical history, and a physical exam.  CT scan of the brain.  MRI.  CT angiogram. This test uses a computer to take X-rays of your arteries. A dye may be injected into your blood to show the inside of your blood vessels more clearly.  MRI angiogram. This is a type of MRI that is used to evaluate the blood vessels.  Cerebral angiogram. This test uses X-rays and a dye to show the blood vessels in the brain and neck. You may need to see a health care provider who specializes in stroke care. A stroke specialist can be seen in person or through communication using telephone or  television technology (telemedicine). Other tests may also be done to find the cause of the stroke, such as:  Electrocardiogram (ECG).  Continuous heart monitoring.  Echocardiogram.  Carotid ultrasound.  A scan of the brain circulation.  Blood tests.  Sleep study to check for sleep apnea. How is this treated? Treatment for this condition will depend on the duration, severity, and cause of your symptoms and on the area of the brain affected. It is very important to get treatment at the first sign of stroke symptoms. Some treatments work better if they are done within 3-6 hours of the onset of stroke symptoms. These initial treatments may include:  Aspirin.  Medicines to control blood pressure.  Medicine given by injection to dissolve the blood clot (thrombolytic).  Treatments given directly to the affected artery to remove or dissolve the blood clot. Other treatment options may include:  Oxygen.  IV fluids.  Medicines to thin the blood (anticoagulants or antiplatelets).  Procedures to increase blood flow. Medicines and changes to your diet may be used to help treat and manage risk factors for stroke, such as diabetes, high cholesterol, and high blood pressure. After a stroke, you may work with physical, speech, mental health, or occupational  therapists to help you recover. Follow these instructions at home: Medicines  Take over-the-counter and prescription medicines only as told by your health care provider.  If you were told to take a medicine to thin your blood, such as aspirin or an anticoagulant, take it exactly as told by your health care provider.  Taking too much blood-thinning medicine can cause bleeding.  If you do not take enough blood-thinning medicine, you will not have the protection that you need against another stroke and other problems.  Understand the side effects of taking anticoagulant medicine. When taking this type of medicine, make sure  you:  Hold pressure over any cuts for longer than usual.  Tell your dentist and other health care providers that you are taking anticoagulants before you have any procedures that may cause bleeding.  Avoid activities that may cause trauma or injury. Eating and drinking  Follow instructions from your health care provider about diet.  Eat healthy foods.  If your ability to swallow was affected by the stroke, you may need to take steps to avoid choking, such as:  Taking small bites when eating.  Eating foods that are soft or pureed. Safety  Follow instructions from your health care team about physical activity.  Use a walker or cane as told by your health care provider.  Take steps to create a safe home environment in order to reduce the risk of falls. This may include:  Having your home looked at by specialists.  Installing grab bars in the bedroom and bathroom.  Using safety equipment, such as raised toilets and a seat in the shower. General instructions  Do not use any tobacco products, such as cigarettes, chewing tobacco, and e-cigarettes. If you need help quitting, ask your health care provider.  Limit alcohol intake to no more than 1 drink a day for nonpregnant women and 2 drinks a day for men. One drink equals 12 oz of beer, 5 oz of wine, or 1 oz of hard liquor.  If you need help to stop using drugs or alcohol, ask your health care provider about a referral to a program or specialist.  Maintain an active and healthy lifestyle. Get regular exercise as told by your health care provider.  Keep all follow-up visits as told by your health care provider, including visits with all specialists on your health care team. This is important. How is this prevented? Your risk of another stroke can be decreased by managing high blood pressure, high cholesterol, diabetes, heart disease, sleep apnea, and obesity. It can also be decreased by quitting smoking, limiting alcohol, and  staying physically active. Your health care provider will continue to work with you on measures to prevent short-term and long-term complications of stroke. Get help right away if: You have:  Sudden weakness or numbness in your face, arm, or leg, especially on one side of your body.  Sudden confusion.  Sudden trouble speaking, understanding, or both (aphasia).  Sudden trouble seeing with one or both eyes.  Sudden trouble walking or difficulty moving your arms or legs.  Sudden dizziness.  Sudden loss of balance or coordination.  Sudden, severe headache with no known cause.  A partial or total loss of consciousness.  A seizure. Any of these symptoms may represent a serious problem that is an emergency. Do not wait to see if the symptoms will go away. Get medical help right away. Call your local emergency services (911 in [place] Do not drive yourself to the hospital.  This  information is not intended to replace advice given to you by your health care provider. Make sure you discuss any questions you have with your health care provider. Document Released: 09/27/2005 Document Revised: 03/09/2016 Document Reviewed: 12/24/2015 Elsevier Interactive Patient Education  2017 [employer].

## 2016-10-20 NOTE — Clinical Social Work Note (Addendum)
Patient's family also interested in SNF's in [place]. CSW faxed to only [facility] on hub: [place]. Awaiting response. Patient's family was unaware that patient would be staying at SNF if she does end up going. CSW provided bed offers so far. CSW will have MD sign 30 day note for PASARR.  Dayton Scrape, CSW [phone]  1:49 pm H&P, FL2, and 30 day note faxed to [facility] for review.  Dayton Scrape, CSW [phone]  3:23 pm PASARR obtained: [id number]. Patient's family has chosen [facility] says it's only if they get the denial from CIR. Patient agreeable to [facility]. CSW has reached to out to facility to see if they have a bed today.  Dayton Scrape, [place] [phone]  5:23 pm Plan for discharge to SNF tomorrow.  Dayton Scrape, [place]

## 2016-10-20 NOTE — Care Management Note (Signed)
Case Management Note  Patient Details  Name: OLUWATAMILORE STARNES MRN: [id number] Date of Birth: 1922-03-22  Subjective/Objective:                    Action/Plan: CM met with the patient and her family to go over the d/c plan. Pt waiting to hear about admission into CIR through an appeal with [facility] medicare. CM went over that a back up plan needed to be established since pt is medically ready for discharge per MD. The family chose [facility] [place].  Per CSW Pt not able to discharge to [facility] until tomorrow. MD, family and pt aware. CM following.  Expected Discharge Date:                  Expected Discharge Plan:  [place]  In-House Referral:     Discharge planning Services     Post Acute Care Choice:    Choice offered to:     DME Arranged:    DME Agency:     HH Arranged:    [place] Agency:     Status of Service:  In process, will continue to follow  If discussed at Long Length of Stay Meetings, dates discussed:    Additional Comments:  Pollie Friar, RN 10/20/2016, 6:51 PM

## 2016-10-20 NOTE — Progress Notes (Addendum)
PROGRESS NOTE  Bonnie Roberts  [id number] DOB: [birth year]  DOA: 10/17/2016 PCP: Nyoka Cowden, MD   Brief Narrative:  83 year old female with PMH of A. fib, recently transitioned from Coumadin to Xarelto, peripheral venous insufficiency on Lasix, HTN, anxiety, depression, hard of hearing, GERD, HTN, presented to ED 10/17/16 with complaints of altered mental status, right-sided weakness and aphasia. Confirmed to have stroke. Neurology consulted. Completed stroke workup. Her insurance declined CIR. Potentially to SNF on 10/21/16.   Assessment & Plan:   Principal Problem:   Acute CVA (cerebrovascular accident) ([place]) Active Problems:   ANXIETY DEPRESSION   Essential hypertension   Atrial fibrillation (HCC)   Venous (peripheral) insufficiency   Acute kidney injury ([place])   Acute hyperglycemia   Stage 3 chronic kidney disease   Benign essential HTN   1. Acute CVA: Left temporal occipital infarct, embolic secondary to known A. fib. Resultant aphasia, transient right-sided weakness and homonymous hemianopsia. Neurology was consulted. Patient has completed stroke workup. CT head/neck: Left M3 occlusion. CT perfusion restricted diffusion compatible with infarct. MRI: Left temporal occipital infarct. Slow flow left MCA M2 branches. 2-D echo: LVEF 55-60 percent. LDL 116. A1c 5.7. Patient was on Xarelto prior to admission and as per neurology, continue same. Briefly on aspirin in the hospital which has been discontinued. Therapy is recommended CIR but patient's insurance apparently declined. Outpatient follow-up with stroke M.D. in 6 weeks. DC to SNF possibly 10/21/16 (as per clinical social worker, would not be able to go late on 1/10). 2. A. fib: Was on atenolol and Cardizem prior to admission. Currently rate controlled while these medications have been held since admission. In fact patient has had transient periods of slow ventricular rate with heart rates in the 40s. Continue to hold  atenolol and Cardizem which can be reassessed during outpatient follow-up. Continue Xarelto and as per neurology, patient had missed a few doses of this likely leading to the stroke. 3. Essential hypertension: Controlled. Atenolol and Cardizem on hold. Monitor closely as outpatient. 4. Hyperlipidemia: LDL 116, goal <70. Added atorvastatin and continue at discharge. Continue fish oil that she was taking prior to admission. 5. Acute kidney injury: Resolved. 6. GERD: PPI 7. Hard of hearing:   DVT prophylaxis: Xarelto Code Status: DNR Family Communication: None at bedside Disposition Plan: DC to [facility] 10/21/16.   Consultants:   Neurology  Procedures:   None  Antimicrobials:   None    Subjective: States that she feels much better. Denies extremity weakness. Indicates that her speech has significantly improved. Denies any other complaints. As per RN, no acute issues.  Objective:  Vitals:   10/20/16 0525 10/20/16 0830 10/20/16 1230 10/20/16 1400  BP: 132/69 132/61 130/69 (!) 147/71  Pulse: 69 71 76 72  Resp: 16 16 16 18   Temp: 98.4 F (36.9 C) 97.5 F (36.4 C) 97.8 F (36.6 C) 98.7 F (37.1 C)  TempSrc: Oral Oral Oral Oral  SpO2: 97% 99% 97% 96%  Weight:        Intake/Output Summary (Last 24 hours) at 10/20/16 1715 Last data filed at 10/20/16 1300  Gross per 24 hour  Intake              840 ml  Output                0 ml  Net              840 ml   Filed Weights   10/18/16 1500  Weight: 65.9 kg (  145 lb 3.2 oz)    Examination:  General exam: Pleasant elderly female sitting up comfortably in bed this morning. Respiratory system: Clear to auscultation. Respiratory effort normal. Cardiovascular system: S1 & S2 heard, irregularly irregular. No JVD, murmurs, rubs, gallops or clicks. No pedal edema. Telemetry: A. fib with controlled ventricular rate. Occasional and transient slow ventricular rate in the 40s. No pauses greater than 3 seconds. Gastrointestinal system:  Abdomen is nondistended, soft and nontender. No organomegaly or masses felt. Normal bowel sounds heard. Central nervous system: Alert and oriented. No focal neurological deficits. Mild occasional aphasia/expressive. Extremities: Symmetric 5 x 5 power. Skin: No rashes, lesions or ulcers Psychiatry: Judgement and insight appear normal. Mood & affect appropriate.     Data Reviewed: I have personally reviewed following labs and imaging studies  CBC:  Recent Labs Lab 10/17/16 1134 10/17/16 1143  WBC 5.3  --   NEUTROABS 3.6  --   HGB 13.7 12.2  HCT 40.6 36.0  MCV 95.3  --   PLT 191  --    Basic Metabolic Panel:  Recent Labs Lab 10/17/16 1134 10/17/16 1143 10/19/16 0834  NA 142 144 143  K 4.1 4.2 3.8  CL 104 100* 109  CO2 30  --  25  GLUCOSE 142* 137* 125*  BUN 20 24* 14  CREATININE 1.23* 1.40* 1.17*  CALCIUM 9.1  --  9.0   GFR: Estimated Creatinine Clearance: 25.7 mL/min (by C-G formula based on SCr of 1.17 mg/dL (H)). Liver Function Tests:  Recent Labs Lab 10/17/16 1134 10/19/16 0834  AST 32 31  ALT 25 22  ALKPHOS 70 68  BILITOT 0.7 1.5*  PROT 6.7 6.4*  ALBUMIN 3.6 3.4*   No results for input(s): LIPASE, AMYLASE in the last 168 hours. No results for input(s): AMMONIA in the last 168 hours. Coagulation Profile:  Recent Labs Lab 10/17/16 1134  INR 2.26   Cardiac Enzymes: No results for input(s): CKTOTAL, CKMB, CKMBINDEX, TROPONINI in the last 168 hours. BNP (last 3 results) No results for input(s): PROBNP in the last 8760 hours. HbA1C:  Recent Labs  10/18/16 0435  HGBA1C 5.7*   CBG: No results for input(s): GLUCAP in the last 168 hours. Lipid Profile:  Recent Labs  10/18/16 0435  CHOL 171  HDL 45  LDLCALC 116*  TRIG 49  CHOLHDL 3.8   Thyroid Function Tests: No results for input(s): TSH, T4TOTAL, FREET4, T3FREE, THYROIDAB in the last 72 hours. Anemia Panel: No results for input(s): VITAMINB12, FOLATE, FERRITIN, TIBC, IRON, RETICCTPCT  in the last 72 hours.  Sepsis Labs:  Recent Labs Lab 10/17/16 1134  WBC 5.3    No results found for this or any previous visit (from the past 240 hour(s)).       Radiology Studies: No results found.      Scheduled Meds: .  stroke: mapping our early stages of recovery book   Does not apply Once  . atorvastatin  40 mg Oral q1800  . docusate sodium  100 mg Oral Daily  . omega-3 acid ethyl esters  1 g Oral Daily  . pantoprazole  40 mg Oral Daily  . polyethylene glycol  17 g Oral Daily  . rivaroxaban  15 mg Oral Q supper   Continuous Infusions: . sodium chloride 75 mL/hr at 10/18/16 1622     LOS: 3 days       [facility], MD [facility] Pager [phone]  If 7PM-7AM, please contact night-coverage [email or web address] Password [email or web address] 10/20/2016, 5:15 PM

## 2016-10-20 NOTE — Progress Notes (Signed)
I met with pt, son, two daughters in law, SW and RN CM. I have not heard a decision from [place] for my expedited appeal. Noted pt is ready for d/c. I have advised pt and family to pursue SNF rehab at this time. They are in agreement. I will sign off. [phone]

## 2016-10-21 NOTE — Progress Notes (Signed)
Patient and family aware of discharge, tele discontinued, ivs removed. Neuro status unchanged. Denies any pain. Family states they will be transporting patient to [facility].

## 2016-10-21 NOTE — Care Management Note (Signed)
Case Management Note  Patient Details  Name: Bonnie Roberts MRN: [id number] Date of Birth: 05-Jan-1922  Subjective/Objective:                    Action/Plan: Pt discharging to [facility] today. Family to provide transportation to the facility. No further needs per CM.   Expected Discharge Date:                  Expected Discharge Plan:  IP Rehab Facility  In-House Referral:  Clinical Social Work  Discharge planning Services  CM Consult  Post Acute Care Choice:    Choice offered to:     DME Arranged:    DME Agency:     HH Arranged:    [place] Agency:     Status of Service:  Completed, signed off  If discussed at [employer], dates discussed:    Additional Comments:  Pollie Friar, RN 10/21/2016, 12:53 PM

## 2016-10-21 NOTE — Progress Notes (Signed)
Physical Therapy Treatment Patient Details Name: Bonnie Roberts MRN: [id number] DOB: [birth year] Today's Date: 10/21/2016    History of Present Illness MITZEL MIRANTE is a 83 y.o. female with a Past Medical History significant for  Afib, PVD, HTN, anxiety who presents with transients right-sided weakness. Patient with left-sided stroke on MRI (L parietal infarct).     PT Comments    Pt was able to tolerate increased ambulation and LE exercises without c/o increased pain. Pt still presents with cognitive, vision and speech deficits. Pt observed with difficulty in regards to word finding. Pt remains from CIR placement with emphasis on speech and OT deficits.    Follow Up Recommendations  CIR     Equipment Recommendations  Other (comment) (TBA)    Recommendations for Other Services Rehab consult     Precautions / Restrictions Precautions Precautions: Fall Restrictions Weight Bearing Restrictions: No    Mobility  Bed Mobility Overal bed mobility: Modified Independent Bed Mobility: Supine to Sit     Supine to sit: Modified independent (Device/Increase time)     General bed mobility comments: good technique, no need for v/c  Transfers Overall transfer level: Needs assistance Equipment used: None Transfers: Sit to/from Stand Sit to Stand: Supervision         General transfer comment: supervision for safety, pt slightly unsteady. Pt stood impulsively, gait belt applied in standing.   Ambulation/Gait Ambulation/Gait assistance: Min guard Ambulation Distance (Feet): 200 Feet Assistive device: None Gait Pattern/deviations: Step-through pattern;Decreased stride length;Trunk flexed   Gait velocity interpretation: at or above normal speed for age/gender General Gait Details: cues for upright posture; pt fatigues quickly    Stairs            Wheelchair Mobility    Modified Rankin (Stroke Patients Only) Modified Rankin (Stroke Patients Only) Pre-Morbid  Rankin Score: No significant disability Modified Rankin: Moderately severe disability     Balance Overall balance assessment: Needs assistance   Sitting balance-Leahy Scale: Normal     Standing balance support: Bilateral upper extremity supported Standing balance-Leahy Scale: Fair Standing balance comment: B UE support at sink when challenging balance.                     Cognition Arousal/Alertness: Awake/alert Behavior During Therapy: WFL for tasks assessed/performed Overall Cognitive Status: Within Functional Limits for tasks assessed                 General Comments: Remains to present with word finding    Exercises Total Joint Exercises Towel Squeeze: AROM;Seated;Both;10 reps Standing Hip Extension: AROM;Both;10 reps;Standing General Exercises - Lower Extremity Long Arc Quad: Seated;Both;10 reps;AROM Heel Raises: AROM;Standing;Both;10 reps Mini-Sqauts: AROM;Both;10 reps;Standing    General Comments        Pertinent Vitals/Pain Pain Assessment: No/denies pain    Home Living                      Prior Function            PT Goals (current goals can now be found in the care plan section) Acute Rehab PT Goals Patient Stated Goal: family stated they want her to go to CIR  PT Goal Formulation: With patient/family Potential to Achieve Goals: Good    Frequency    Min 4X/week      PT Plan Current plan remains appropriate    Co-evaluation             End of Session Equipment Utilized  During Treatment: Gait belt Activity Tolerance: Patient tolerated treatment well;Patient limited by fatigue Patient left: in bed;with call bell/phone within reach;with family/visitor present     Time: [id number] PT Time Calculation (min) (ACUTE ONLY): 19 min  Charges:  $Therapeutic Exercise: 8-22 mins                    G Codes:      [facility] 10-24-2016, 11:42 AM  Olena Leatherwood, SPTA Pager [phone]

## 2016-10-21 NOTE — Clinical Social Work Note (Signed)
Patient discharging to [facility] today. Family to transport by car. RN to call report to [phone] Room 103A.  CSW signing off.  Dayton Scrape, [place]

## 2016-10-21 NOTE — Care Management Important Message (Signed)
Important Message  Patient Details  Name: Bonnie Roberts MRN: [id number] Date of Birth: [birth year]   Medicare Important Message Given:  Yes    [name] [address] 10/21/2016, 12:26 PM

## 2016-10-21 NOTE — Discharge Summary (Addendum)
Physician Discharge Summary  Bonnie Roberts [id number] DOB: [birth year]  PCP: Nyoka Cowden, MD  Admit date: 10/17/2016 Discharge date: 10/21/2016  Recommendations for Outpatient Follow-up:  1. MD at SNF in 3 days with repeat labs (CBC & BMP) 2. Dr. Antony Contras, Neurology in 6 weeks. SNF to coordinate. 3. Dr. Bluford Kaufmann, PCP upon discharge from SNF.  Home Health: None Equipment/Devices: None    Discharge Condition: Improved and stable  CODE STATUS: DO NOT RESUSCITATE  Diet recommendation: Heart healthy diet.  Discharge Diagnoses:  Principal Problem:   Acute CVA (cerebrovascular accident) [facility]) Active Problems:   ANXIETY DEPRESSION   Essential hypertension   Atrial fibrillation (HCC)   Venous (peripheral) insufficiency   Acute kidney injury ([place])   Acute hyperglycemia   Stage 3 chronic kidney disease   Benign essential HTN   Brief/Interim Summary: 83 year old female with PMH of A. fib, recently transitioned from Coumadin to Xarelto, peripheral venous insufficiency on Lasix, HTN, anxiety, depression, hard of hearing, GERD, presented to ED 10/17/16 with complaints of altered mental status, right-sided weakness and aphasia. Confirmed to have stroke. Neurology consulted. Completed stroke workup. Her insurance declined CIR and hence discharging to SNF.  Assessment & Plan:   1. Acute CVA: Left temporal occipital infarct, embolic secondary to known A. fib. Resultant aphasia, transient right-sided weakness and homonymous hemianopsia. Neurology was consulted & stroke evaluation was completed. CT head/neck: Left M3 occlusion. CT cerebral perfusion: restricted diffusion compatible with infarct. MRI: Left temporal occipital infarct. Slow flow left MCA M2 branches. 2-D echo: LVEF 55-60 percent (detailed report as below) . LDL 116. A1c 5.7. Patient was on Xarelto prior to admission and as per neurology, continue same. Briefly on aspirin in the hospital which has been  discontinued. Therapy recommended CIR but patient's insurance apparently declined. Outpatient follow-up with stroke M.D. in 6 weeks. DC to SNF for rehabilitation. 2. A. fib: Was on atenolol and Cardizem prior to admission. Currently rate controlled while these medications have been held since admission. In fact patient has had transient periods of slow ventricular rate with heart rates in the 40s. Continue to hold atenolol and Cardizem at DC and reassess during follow-up at SNF. Continue Xarelto and as per neurology, patient had missed a few doses of this likely leading to the stroke. [place] score 4-5 3. Essential hypertension: Allow for permissive hypertension due to recent acute stroke and aim for tighter blood pressure control in 5-7 days. Atenolol and Cardizem on hold. Monitor closely as outpatient. Please see recommendations above. Fluctuating and mildly uncontrolled at times. 4. Hyperlipidemia: LDL 116, goal <70. Added atorvastatin and continue at discharge. Continue fish oil that she was taking prior to admission. 5. Acute kidney injury: Resolved. 6. GERD: PPI 7. Hard of hearing: 8. Chronic venous insufficiency: No lower extremity edema in the hospital. Lasix has been held due to recent acute kidney injury. Reassess volume and lower extremity edema status at SNF to decide on resuming Lasix.    Consultants:   Neurology  Procedures:    2-D echo 10/19/16: Study Conclusions  - Left ventricle: The cavity size was normal. Systolic function was   normal. The estimated ejection fraction was in the range of 55%   to 60%. Wall motion was normal; there were no regional wall   motion abnormalities. Doppler parameters are consistent with high   ventricular filling pressure. - Aortic valve: Mildly to moderately calcified annulus. Trileaflet;   mildly thickened, mildly calcified leaflets. There was trivial   regurgitation. - Mitral  valve: There was mild regurgitation. - Left atrium: The atrium was  severely dilated. - Right atrium: The atrium was severely dilated. - Tricuspid valve: There was moderate regurgitation. - Pulmonic valve: There was mild regurgitation. - Pulmonary arteries: PA peak pressure: 40 mm Hg (S).  Impressions:  - The right ventricular systolic pressure was increased consistent   with moderate pulmonary hypertension.  Discharge Instructions  Discharge Instructions    (HEART FAILURE PATIENTS) Call MD:  Anytime you have any of the following symptoms: 1) 3 pound weight gain in 24 hours or 5 pounds in 1 week 2) shortness of breath, with or without a dry hacking cough 3) swelling in the hands, feet or stomach 4) if you have to sleep on extra pillows at night in order to breathe.    Complete by:  As directed    Ambulatory referral to Neurology    Complete by:  As directed    An appointment is requested in approximately: 8 weeks   Call MD for:    Complete by:  As directed    New strokelike symptoms.   Call MD for:  difficulty breathing, headache or visual disturbances    Complete by:  As directed    Diet - low sodium heart healthy    Complete by:  As directed    Diet - low sodium heart healthy    Complete by:  As directed    Increase activity slowly    Complete by:  As directed        Medication List    STOP taking these medications   atenolol 25 MG tablet Commonly known as:  TENORMIN   diltiazem 120 MG 24 hr capsule Commonly known as:  CARDIZEM CD   furosemide 40 MG tablet Commonly known as:  LASIX   KLOR-CON M20 20 MEQ tablet Generic drug:  potassium chloride SA     TAKE these medications   acetaminophen 325 MG tablet Commonly known as:  TYLENOL Take 2 tablets (650 mg total) by mouth every 6 (six) hours as needed for mild pain, moderate pain, fever or headache. What changed:  when to take this  reasons to take this   atorvastatin 40 MG tablet Commonly known as:  LIPITOR Take 1 tablet (40 mg total) by mouth daily at 6 PM.   b complex  vitamins capsule Take 1 capsule by mouth daily.   BL FLAX SEED OIL 1000 MG Caps Take by mouth daily.   CENTRUM SILVER PO Take by mouth daily.   docusate sodium 100 MG capsule Commonly known as:  COLACE Take 1 capsule (100 mg total) by mouth daily. What changed:  when to take this   Fish Oil 1000 MG Caps 2 TABS DAILY   omeprazole 20 MG capsule Commonly known as:  PRILOSEC TAKE 1 CAPSULE (20 MG TOTAL) BY MOUTH DAILY.   Rivaroxaban 15 MG Tabs tablet Commonly known as:  XARELTO Take 1 tablet (15 mg total) by mouth daily with supper. What changed:  when to take this      Follow-up Information    MD at SNF. Schedule an appointment as soon as possible for a visit in 3 day(s).   Why:  to be seen with repeat labs (CBC & BMP).       SETHI,PRAMOD, MD. Schedule an appointment as soon as possible for a visit in 6 week(s).   Specialties:  Neurology, Radiology Why:  SNF to coordinate. Contact information: [address] [place] [address]  [place], MD. Schedule an appointment as soon as possible for a visit.   Specialty:  Internal Medicine Why:  Upon DC from SNF. Contact information: [place] [address] [phone]          Allergies  Allergen Reactions  . Codeine Nausea And Vomiting  . Nsaids Other (See Comments)    GI issues  . Penicillins Itching and Rash  . Sulfonamide Derivatives Itching and Rash     Procedures/Studies: Ct Angio Head W Or Wo Contrast  Result Date: 10/17/2016 CLINICAL DATA:  Stroke.  Atrial fibrillation. EXAM: CT ANGIOGRAPHY HEAD AND NECK TECHNIQUE: Multidetector CT imaging of the head and neck was performed using the standard protocol during bolus administration of intravenous contrast. Multiplanar CT image reconstructions and MIPs were obtained to evaluate the vascular anatomy. Carotid stenosis measurements (when applicable) are obtained utilizing NASCET criteria,  using the distal internal carotid diameter as the denominator. CONTRAST:  50 mL Isovue 370 IV COMPARISON:  MRI head 10/17/2016 FINDINGS: CTA NECK FINDINGS Aortic arch: Atherosclerotic calcification in the aortic arch without aneurysm. Proximal great vessels widely patent Right carotid system: Right common carotid artery widely patent. Mild atherosclerotic calcification right carotid bifurcation without significant stenosis. Left carotid system: Mild atherosclerotic calcification left common carotid artery. Minimal atherosclerotic disease at the carotid bifurcation without stenosis. Vertebral arteries: Both vertebral arteries widely patent in the neck. Skeleton: Cervical disc and facet degeneration. Negative for fracture. No acute skeletal abnormality. Other neck: Negative Upper chest: Negative Review of the MIP images confirms the above findings CTA HEAD FINDINGS Anterior circulation: Mild atherosclerotic disease in the right cavernous carotid without stenosis. Right anterior and middle cerebral arteries widely patent without stenosis Mild atherosclerotic calcification left cavernous carotid without stenosis. Left anterior cerebral artery widely patent. Left M1 widely patent. Branch occlusion of the left M3 segment supplying the left parietal lobe corresponding to acute infarct. Remainder of the left middle cerebral artery branches are normal. Posterior circulation: Both vertebral arteries widely patent without stenosis. PICA patent bilaterally. Basilar widely patent. AICA, superior cerebellar, and posterior cerebral arteries normal Venous sinuses: Negative Anatomic variants: None Delayed phase: Not performed. Review of the MIP images confirms the above findings IMPRESSION: Left M3 branch occlusion corresponding to the acute left parietal infarct. Probable embolus. Otherwise no significant intracranial stenosis No significant carotid or vertebral artery stenosis in the neck. Electronically Signed   By: Franchot Gallo M.D.   On: 10/17/2016 17:09   Ct Head Wo Contrast  Result Date: 10/17/2016 CLINICAL DATA:  Altered mental status, right-sided weakness, slurred speech EXAM: CT HEAD WITHOUT CONTRAST TECHNIQUE: Contiguous axial images were obtained from the base of the skull through the vertex without intravenous contrast. COMPARISON:  None available FINDINGS: Brain: Mild age related brain atrophy and chronic white matter microvascular changes about the ventricles. No acute intracranial hemorrhage, mass lesion, definite infarction, midline shift, herniation, hydrocephalus, or extra-axial fluid collection. Normal gray-white matter differentiation. No focal mass effect or edema. Cisterns are patent. Cerebellar atrophy as well. Vascular: Atherosclerotic vascular changes noted. Skull: Hyperostosis frontalis interna evident. No acute osseous finding or fracture. Mastoids and sinuses remain clear. Sinuses/Orbits: No acute finding. Other: None. IMPRESSION: Age related brain atrophy and minor white matter microvascular ischemic changes. No acute intracranial abnormality by noncontrast CT. Intracranial atherosclerosis Electronically Signed   By: Jerilynn Mages.  Shick M.D.   On: 10/17/2016 11:52   Ct Angio Neck W Or Wo Contrast  Result Date:  10/17/2016 CLINICAL DATA:  Stroke.  Atrial fibrillation. EXAM: CT ANGIOGRAPHY HEAD AND NECK TECHNIQUE: Multidetector CT imaging of the head and neck was performed using the standard protocol during bolus administration of intravenous contrast. Multiplanar CT image reconstructions and MIPs were obtained to evaluate the vascular anatomy. Carotid stenosis measurements (when applicable) are obtained utilizing NASCET criteria, using the distal internal carotid diameter as the denominator. CONTRAST:  50 mL Isovue 370 IV COMPARISON:  MRI head 10/17/2016 FINDINGS: CTA NECK FINDINGS Aortic arch: Atherosclerotic calcification in the aortic arch without aneurysm. Proximal great vessels widely patent Right carotid  system: Right common carotid artery widely patent. Mild atherosclerotic calcification right carotid bifurcation without significant stenosis. Left carotid system: Mild atherosclerotic calcification left common carotid artery. Minimal atherosclerotic disease at the carotid bifurcation without stenosis. Vertebral arteries: Both vertebral arteries widely patent in the neck. Skeleton: Cervical disc and facet degeneration. Negative for fracture. No acute skeletal abnormality. Other neck: Negative Upper chest: Negative Review of the MIP images confirms the above findings CTA HEAD FINDINGS Anterior circulation: Mild atherosclerotic disease in the right cavernous carotid without stenosis. Right anterior and middle cerebral arteries widely patent without stenosis Mild atherosclerotic calcification left cavernous carotid without stenosis. Left anterior cerebral artery widely patent. Left M1 widely patent. Branch occlusion of the left M3 segment supplying the left parietal lobe corresponding to acute infarct. Remainder of the left middle cerebral artery branches are normal. Posterior circulation: Both vertebral arteries widely patent without stenosis. PICA patent bilaterally. Basilar widely patent. AICA, superior cerebellar, and posterior cerebral arteries normal Venous sinuses: Negative Anatomic variants: None Delayed phase: Not performed. Review of the MIP images confirms the above findings IMPRESSION: Left M3 branch occlusion corresponding to the acute left parietal infarct. Probable embolus. Otherwise no significant intracranial stenosis No significant carotid or vertebral artery stenosis in the neck. Electronically Signed   By: Franchot Gallo M.D.   On: 10/17/2016 17:09   Mr Brain Wo Contrast  Result Date: 10/17/2016 CLINICAL DATA:  Right-sided weakness and slurred speech. Trouble finding words. EXAM: MRI HEAD WITHOUT CONTRAST TECHNIQUE: Multiplanar, multiecho pulse sequences of the brain and surrounding structures  were obtained without intravenous contrast. COMPARISON:  Head CT from earlier today FINDINGS: Brain: Moderate area of cortical and white matter restricted diffusion in the posterior and superior left temporal lobe and lateral left occipital lobe. This is in the MCA distribution in there is T1 and FLAIR hyperintensity within left MCA branches seen at the level of the infarct and proximally to a M2 insular branch. No acute hemorrhage. Susceptibility artifact along the left sylvian fissure but no visible hematoma on the other sequences. Mild for age microvascular ischemic gliosis in the cerebral white matter. Probable inconsequential hygroma around the left cerebellum. No hydrocephalus or mass. Vascular: Slow flow/ thrombus in left MCA branches as described above. Skull and upper cervical spine: Negative Sinuses/Orbits: Bilateral cataract resection. IMPRESSION: Moderate area of acute infarct in the left temporal occipital region. Associated MCA branches show slow flow or thrombus to the level of an insular M2 branch. Electronically Signed   By: Monte Fantasia M.D.   On: 10/17/2016 13:24   Ct Cerebral Perfusion W Contrast  Result Date: 10/17/2016 CLINICAL DATA:  Stroke. Left parietal infarct on MRI. Atrial fibrillation. EXAM: CT PERFUSION BRAIN TECHNIQUE: Multiphase CT imaging of the brain was performed following IV bolus contrast injection. Subsequent parametric perfusion maps were calculated using RAPID software. CONTRAST:  40 mL Isovue 370 IV COMPARISON:  MRI head CT 10/17/2016 FINDINGS: CT  Brain Perfusion Findings: CBF (<30%) Volume: 65mL Perfusion (Tmax>6.0s) volume: 21mL Mismatch Volume: 59mL Infarction Location:Increased T-max in the left parietal lobe corresponding to the area of restricted diffusion on MRI. This area does not show any decrease in cerebral blood flow. IMPRESSION: Restricted diffusion in the left parietal lobe compatible with infarct. This area shows increased T-max of 26 mL. No reduction in  cerebral blood flow in this area. Possible reperfusion of infarction. Electronically Signed   By: Franchot Gallo M.D.   On: 10/17/2016 16:59      Subjective: Patient denies complaints. Indicates that her speech continues to improve day by day. Denies asymmetric limb weakness.  Discharge Exam:  Vitals:   10/20/16 2100 10/21/16 0127 10/21/16 0545 10/21/16 1007  BP: 139/76 (!) 152/66 (!) 171/73 (!) 144/56  Pulse: 78 (!) 51 (!) 51 79  Resp: 16 20 18 19   Temp: 98 F (36.7 C) 97.4 F (36.3 C) 98 F (36.7 C) 98.2 F (36.8 C)  TempSrc: Oral Oral Oral Oral  SpO2: 94% 96% 96% 97%  Weight:        General exam: Pleasant elderly female sitting up comfortably in bed this morning. Respiratory system: Clear to auscultation. Respiratory effort normal. Cardiovascular system: S1 & S2 heard, irregularly irregular. No JVD, murmurs, rubs, gallops or clicks. No pedal edema. Telemetry: A. fib with controlled ventricular rate and ventricular rate ranging between 50s-80s. No significant slow ventricular responses over the last 24 hours. Gastrointestinal system: Abdomen is nondistended, soft and nontender. No organomegaly or masses felt. Normal bowel sounds heard. Central nervous system: Alert and oriented. No focal neurological deficits. Mild occasional aphasia/expressive. Extremities: Symmetric 5 x 5 power. Skin: No rashes, lesions or ulcers Psychiatry: Judgement and insight appear normal. Mood & affect appropriate.     The results of significant diagnostics from this hospitalization (including imaging, microbiology, ancillary and laboratory) are listed below for reference.     Microbiology: No results found for this or any previous visit (from the past 240 hour(s)).   Labs: BNP (last 3 results) No results for input(s): BNP in the last 8760 hours. Basic Metabolic Panel:  Recent Labs Lab 10/17/16 1134 10/17/16 1143 10/19/16 0834  NA 142 144 143  K 4.1 4.2 3.8  CL 104 100* 109  CO2 30  --   25  GLUCOSE 142* 137* 125*  BUN 20 24* 14  CREATININE 1.23* 1.40* 1.17*  CALCIUM 9.1  --  9.0   Liver Function Tests:  Recent Labs Lab 10/17/16 1134 10/19/16 0834  AST 32 31  ALT 25 22  ALKPHOS 70 68  BILITOT 0.7 1.5*  PROT 6.7 6.4*  ALBUMIN 3.6 3.4*   CBC:  Recent Labs Lab 10/17/16 1134 10/17/16 1143  WBC 5.3  --   NEUTROABS 3.6  --   HGB 13.7 12.2  HCT 40.6 36.0  MCV 95.3  --   PLT 191  --    Urinalysis    Component Value Date/Time   COLORURINE YELLOW 10/17/2016 1220   APPEARANCEUR HAZY (A) 10/17/2016 1220   LABSPEC 1.010 10/17/2016 1220   PHURINE 6.0 10/17/2016 1220   GLUCOSEU NEGATIVE 10/17/2016 1220   HGBUR NEGATIVE 10/17/2016 1220   BILIRUBINUR NEGATIVE 10/17/2016 1220   BILIRUBINUR n 10/29/2014 1408   KETONESUR NEGATIVE 10/17/2016 1220   PROTEINUR NEGATIVE 10/17/2016 1220   UROBILINOGEN 1.0 10/29/2014 1408   UROBILINOGEN 1.0 01/30/2011 1533   NITRITE NEGATIVE 10/17/2016 1220   LEUKOCYTESUR TRACE (A) 10/17/2016 1220     Time coordinating  discharge: Over 30 minutes  SIGNED:  Vernell Leep, MD, FACP, FHM. [facility] Pager [phone]  If 7PM-7AM, please contact night-coverage [email or web address] Password [email or web address] 10/21/2016, 12:02 PM

## 2016-10-21 NOTE — Progress Notes (Signed)
Report given to [place] at [facility]. Patient taken to car by wheelchair with volunteer services.

## 2016-11-02 ENCOUNTER — Ambulatory Visit: Payer: Self-pay | Admitting: General Practice

## 2016-11-03 ENCOUNTER — Other Ambulatory Visit: Payer: Self-pay | Admitting: Internal Medicine

## 2016-11-21 ENCOUNTER — Other Ambulatory Visit: Payer: Self-pay | Admitting: Internal Medicine

## 2016-11-25 ENCOUNTER — Other Ambulatory Visit: Payer: Self-pay | Admitting: Internal Medicine

## 2016-12-20 ENCOUNTER — Ambulatory Visit: Payer: Self-pay | Admitting: Neurology

## 2016-12-24 ENCOUNTER — Other Ambulatory Visit: Payer: Self-pay | Admitting: Family Medicine

## 2016-12-24 ENCOUNTER — Encounter: Payer: Self-pay | Admitting: Neurology

## 2016-12-24 DIAGNOSIS — R748 Abnormal levels of other serum enzymes: Secondary | ICD-10-CM

## 2021-02-08 DEATH — deceased
